# Patient Record
Sex: Female | Born: 1945 | Race: White | Hispanic: No | Smoking: Former smoker
Health system: Southern US, Community
[De-identification: ages and names within clinical notes are randomized; demographics above are authoritative.]

## PROBLEM LIST (undated history)

## (undated) DIAGNOSIS — E782 Mixed hyperlipidemia: Secondary | ICD-10-CM

## (undated) DIAGNOSIS — D649 Anemia, unspecified: Secondary | ICD-10-CM

## (undated) DIAGNOSIS — G971 Other reaction to spinal and lumbar puncture: Secondary | ICD-10-CM

## (undated) DIAGNOSIS — R600 Localized edema: Secondary | ICD-10-CM

## (undated) DIAGNOSIS — M5136 Other intervertebral disc degeneration, lumbar region: Secondary | ICD-10-CM

## (undated) DIAGNOSIS — R609 Edema, unspecified: Secondary | ICD-10-CM

## (undated) DIAGNOSIS — G2581 Restless legs syndrome: Secondary | ICD-10-CM

## (undated) DIAGNOSIS — F32A Depression, unspecified: Secondary | ICD-10-CM

## (undated) DIAGNOSIS — Z87442 Personal history of urinary calculi: Secondary | ICD-10-CM

## (undated) DIAGNOSIS — M48061 Spinal stenosis, lumbar region without neurogenic claudication: Secondary | ICD-10-CM

## (undated) DIAGNOSIS — M51369 Other intervertebral disc degeneration, lumbar region without mention of lumbar back pain or lower extremity pain: Secondary | ICD-10-CM

## (undated) DIAGNOSIS — K219 Gastro-esophageal reflux disease without esophagitis: Secondary | ICD-10-CM

## (undated) DIAGNOSIS — Z9889 Other specified postprocedural states: Secondary | ICD-10-CM

## (undated) DIAGNOSIS — G4730 Sleep apnea, unspecified: Secondary | ICD-10-CM

## (undated) DIAGNOSIS — I10 Essential (primary) hypertension: Secondary | ICD-10-CM

## (undated) DIAGNOSIS — R112 Nausea with vomiting, unspecified: Secondary | ICD-10-CM

## (undated) DIAGNOSIS — R7303 Prediabetes: Secondary | ICD-10-CM

## (undated) DIAGNOSIS — E039 Hypothyroidism, unspecified: Secondary | ICD-10-CM

## (undated) HISTORY — DX: Nausea with vomiting, unspecified: R11.2

## (undated) HISTORY — PX: ABDOMINAL HYSTERECTOMY: SHX81

## (undated) HISTORY — PX: CATARACT EXTRACTION: SUR2

## (undated) HISTORY — PX: RHINOPLASTY: SUR1284

## (undated) HISTORY — PX: TONSILLECTOMY: SUR1361

---

## 2013-11-27 DIAGNOSIS — M4310 Spondylolisthesis, site unspecified: Secondary | ICD-10-CM | POA: Insufficient documentation

## 2013-11-27 DIAGNOSIS — G4700 Insomnia, unspecified: Secondary | ICD-10-CM

## 2013-11-27 HISTORY — DX: Spondylolisthesis, site unspecified: M43.10

## 2013-11-27 HISTORY — DX: Insomnia, unspecified: G47.00

## 2015-07-02 DIAGNOSIS — R079 Chest pain, unspecified: Secondary | ICD-10-CM

## 2015-07-02 HISTORY — DX: Chest pain, unspecified: R07.9

## 2015-10-08 DIAGNOSIS — Z79899 Other long term (current) drug therapy: Secondary | ICD-10-CM

## 2015-10-08 DIAGNOSIS — E039 Hypothyroidism, unspecified: Secondary | ICD-10-CM

## 2015-10-08 DIAGNOSIS — I10 Essential (primary) hypertension: Secondary | ICD-10-CM

## 2015-10-08 DIAGNOSIS — K219 Gastro-esophageal reflux disease without esophagitis: Secondary | ICD-10-CM

## 2015-10-08 HISTORY — DX: Essential (primary) hypertension: I10

## 2015-10-08 HISTORY — DX: Gastro-esophageal reflux disease without esophagitis: K21.9

## 2015-10-08 HISTORY — DX: Hypothyroidism, unspecified: E03.9

## 2015-10-08 HISTORY — DX: Other long term (current) drug therapy: Z79.899

## 2016-08-05 DIAGNOSIS — R5381 Other malaise: Secondary | ICD-10-CM | POA: Diagnosis not present

## 2016-08-05 DIAGNOSIS — R5383 Other fatigue: Secondary | ICD-10-CM | POA: Diagnosis not present

## 2016-08-05 DIAGNOSIS — E782 Mixed hyperlipidemia: Secondary | ICD-10-CM | POA: Diagnosis not present

## 2016-08-06 DIAGNOSIS — L820 Inflamed seborrheic keratosis: Secondary | ICD-10-CM | POA: Diagnosis not present

## 2016-09-10 DIAGNOSIS — I83893 Varicose veins of bilateral lower extremities with other complications: Secondary | ICD-10-CM | POA: Diagnosis not present

## 2016-09-13 DIAGNOSIS — I10 Essential (primary) hypertension: Secondary | ICD-10-CM | POA: Diagnosis not present

## 2016-09-13 DIAGNOSIS — M7989 Other specified soft tissue disorders: Secondary | ICD-10-CM | POA: Diagnosis not present

## 2016-09-13 DIAGNOSIS — M79661 Pain in right lower leg: Secondary | ICD-10-CM | POA: Diagnosis not present

## 2016-09-14 DIAGNOSIS — M79661 Pain in right lower leg: Secondary | ICD-10-CM | POA: Diagnosis not present

## 2016-09-14 DIAGNOSIS — M7062 Trochanteric bursitis, left hip: Secondary | ICD-10-CM | POA: Diagnosis not present

## 2016-09-21 DIAGNOSIS — R2689 Other abnormalities of gait and mobility: Secondary | ICD-10-CM | POA: Diagnosis not present

## 2016-09-21 DIAGNOSIS — M25552 Pain in left hip: Secondary | ICD-10-CM | POA: Diagnosis not present

## 2016-09-21 DIAGNOSIS — M6281 Muscle weakness (generalized): Secondary | ICD-10-CM | POA: Diagnosis not present

## 2016-09-23 DIAGNOSIS — R2689 Other abnormalities of gait and mobility: Secondary | ICD-10-CM | POA: Diagnosis not present

## 2016-09-23 DIAGNOSIS — M25552 Pain in left hip: Secondary | ICD-10-CM | POA: Diagnosis not present

## 2016-09-23 DIAGNOSIS — M6281 Muscle weakness (generalized): Secondary | ICD-10-CM | POA: Diagnosis not present

## 2016-09-27 DIAGNOSIS — M25552 Pain in left hip: Secondary | ICD-10-CM | POA: Diagnosis not present

## 2016-09-27 DIAGNOSIS — R2689 Other abnormalities of gait and mobility: Secondary | ICD-10-CM | POA: Diagnosis not present

## 2016-09-27 DIAGNOSIS — M6281 Muscle weakness (generalized): Secondary | ICD-10-CM | POA: Diagnosis not present

## 2016-09-29 DIAGNOSIS — H26493 Other secondary cataract, bilateral: Secondary | ICD-10-CM | POA: Diagnosis not present

## 2016-09-29 DIAGNOSIS — R2689 Other abnormalities of gait and mobility: Secondary | ICD-10-CM | POA: Diagnosis not present

## 2016-09-29 DIAGNOSIS — M25552 Pain in left hip: Secondary | ICD-10-CM | POA: Diagnosis not present

## 2016-09-29 DIAGNOSIS — M6281 Muscle weakness (generalized): Secondary | ICD-10-CM | POA: Diagnosis not present

## 2016-10-04 DIAGNOSIS — M6281 Muscle weakness (generalized): Secondary | ICD-10-CM | POA: Diagnosis not present

## 2016-10-04 DIAGNOSIS — R2689 Other abnormalities of gait and mobility: Secondary | ICD-10-CM | POA: Diagnosis not present

## 2016-10-04 DIAGNOSIS — M25552 Pain in left hip: Secondary | ICD-10-CM | POA: Diagnosis not present

## 2016-10-07 DIAGNOSIS — R2689 Other abnormalities of gait and mobility: Secondary | ICD-10-CM | POA: Diagnosis not present

## 2016-10-07 DIAGNOSIS — M6281 Muscle weakness (generalized): Secondary | ICD-10-CM | POA: Diagnosis not present

## 2016-10-07 DIAGNOSIS — M25552 Pain in left hip: Secondary | ICD-10-CM | POA: Diagnosis not present

## 2016-10-11 DIAGNOSIS — R2689 Other abnormalities of gait and mobility: Secondary | ICD-10-CM | POA: Diagnosis not present

## 2016-10-11 DIAGNOSIS — M25552 Pain in left hip: Secondary | ICD-10-CM | POA: Diagnosis not present

## 2016-10-11 DIAGNOSIS — M6281 Muscle weakness (generalized): Secondary | ICD-10-CM | POA: Diagnosis not present

## 2016-10-17 DIAGNOSIS — J209 Acute bronchitis, unspecified: Secondary | ICD-10-CM | POA: Diagnosis not present

## 2016-10-17 DIAGNOSIS — R05 Cough: Secondary | ICD-10-CM | POA: Diagnosis not present

## 2016-10-21 DIAGNOSIS — I10 Essential (primary) hypertension: Secondary | ICD-10-CM | POA: Diagnosis not present

## 2016-10-21 DIAGNOSIS — J40 Bronchitis, not specified as acute or chronic: Secondary | ICD-10-CM | POA: Diagnosis not present

## 2016-11-17 DIAGNOSIS — J40 Bronchitis, not specified as acute or chronic: Secondary | ICD-10-CM | POA: Diagnosis not present

## 2016-11-17 DIAGNOSIS — Z79899 Other long term (current) drug therapy: Secondary | ICD-10-CM | POA: Diagnosis not present

## 2016-11-17 DIAGNOSIS — Z6841 Body Mass Index (BMI) 40.0 and over, adult: Secondary | ICD-10-CM | POA: Diagnosis not present

## 2016-11-17 DIAGNOSIS — R079 Chest pain, unspecified: Secondary | ICD-10-CM | POA: Diagnosis not present

## 2016-11-17 DIAGNOSIS — I10 Essential (primary) hypertension: Secondary | ICD-10-CM | POA: Diagnosis not present

## 2016-11-17 HISTORY — DX: Morbid (severe) obesity due to excess calories: E66.01

## 2016-11-18 DIAGNOSIS — R079 Chest pain, unspecified: Secondary | ICD-10-CM | POA: Diagnosis not present

## 2016-11-19 DIAGNOSIS — G4733 Obstructive sleep apnea (adult) (pediatric): Secondary | ICD-10-CM | POA: Diagnosis not present

## 2016-11-19 DIAGNOSIS — J40 Bronchitis, not specified as acute or chronic: Secondary | ICD-10-CM | POA: Diagnosis not present

## 2016-11-19 DIAGNOSIS — R5383 Other fatigue: Secondary | ICD-10-CM | POA: Diagnosis not present

## 2016-11-25 DIAGNOSIS — J40 Bronchitis, not specified as acute or chronic: Secondary | ICD-10-CM | POA: Diagnosis not present

## 2016-11-25 DIAGNOSIS — R5383 Other fatigue: Secondary | ICD-10-CM | POA: Diagnosis not present

## 2016-11-25 DIAGNOSIS — G4733 Obstructive sleep apnea (adult) (pediatric): Secondary | ICD-10-CM | POA: Diagnosis not present

## 2017-01-03 DIAGNOSIS — G4733 Obstructive sleep apnea (adult) (pediatric): Secondary | ICD-10-CM | POA: Diagnosis not present

## 2017-01-07 DIAGNOSIS — L820 Inflamed seborrheic keratosis: Secondary | ICD-10-CM | POA: Diagnosis not present

## 2017-01-07 DIAGNOSIS — D485 Neoplasm of uncertain behavior of skin: Secondary | ICD-10-CM | POA: Diagnosis not present

## 2017-01-07 DIAGNOSIS — L74519 Primary focal hyperhidrosis, unspecified: Secondary | ICD-10-CM | POA: Diagnosis not present

## 2017-01-10 DIAGNOSIS — R5383 Other fatigue: Secondary | ICD-10-CM | POA: Diagnosis not present

## 2017-01-10 DIAGNOSIS — G4733 Obstructive sleep apnea (adult) (pediatric): Secondary | ICD-10-CM | POA: Diagnosis not present

## 2017-02-20 DIAGNOSIS — T7840XA Allergy, unspecified, initial encounter: Secondary | ICD-10-CM | POA: Diagnosis not present

## 2017-02-20 DIAGNOSIS — T63411A Toxic effect of venom of centipedes and venomous millipedes, accidental (unintentional), initial encounter: Secondary | ICD-10-CM | POA: Diagnosis not present

## 2017-02-21 DIAGNOSIS — L74519 Primary focal hyperhidrosis, unspecified: Secondary | ICD-10-CM | POA: Diagnosis not present

## 2017-02-21 DIAGNOSIS — L239 Allergic contact dermatitis, unspecified cause: Secondary | ICD-10-CM | POA: Diagnosis not present

## 2017-05-25 DIAGNOSIS — M79671 Pain in right foot: Secondary | ICD-10-CM | POA: Diagnosis not present

## 2017-06-17 DIAGNOSIS — J050 Acute obstructive laryngitis [croup]: Secondary | ICD-10-CM | POA: Diagnosis not present

## 2017-06-27 DIAGNOSIS — I10 Essential (primary) hypertension: Secondary | ICD-10-CM | POA: Diagnosis not present

## 2017-06-27 DIAGNOSIS — H73009 Acute myringitis, unspecified ear: Secondary | ICD-10-CM | POA: Diagnosis not present

## 2017-07-11 DIAGNOSIS — H73009 Acute myringitis, unspecified ear: Secondary | ICD-10-CM | POA: Diagnosis not present

## 2017-07-11 DIAGNOSIS — H6983 Other specified disorders of Eustachian tube, bilateral: Secondary | ICD-10-CM | POA: Diagnosis not present

## 2017-07-14 DIAGNOSIS — I10 Essential (primary) hypertension: Secondary | ICD-10-CM | POA: Diagnosis not present

## 2017-07-14 DIAGNOSIS — Z79899 Other long term (current) drug therapy: Secondary | ICD-10-CM | POA: Diagnosis not present

## 2017-07-14 DIAGNOSIS — G2581 Restless legs syndrome: Secondary | ICD-10-CM | POA: Diagnosis not present

## 2017-07-14 DIAGNOSIS — K219 Gastro-esophageal reflux disease without esophagitis: Secondary | ICD-10-CM | POA: Diagnosis not present

## 2017-07-14 DIAGNOSIS — Z6841 Body Mass Index (BMI) 40.0 and over, adult: Secondary | ICD-10-CM | POA: Diagnosis not present

## 2017-07-14 DIAGNOSIS — E039 Hypothyroidism, unspecified: Secondary | ICD-10-CM | POA: Diagnosis not present

## 2017-07-14 DIAGNOSIS — E782 Mixed hyperlipidemia: Secondary | ICD-10-CM | POA: Diagnosis not present

## 2017-07-20 DIAGNOSIS — H11442 Conjunctival cysts, left eye: Secondary | ICD-10-CM | POA: Diagnosis not present

## 2017-07-29 DIAGNOSIS — H6983 Other specified disorders of Eustachian tube, bilateral: Secondary | ICD-10-CM | POA: Diagnosis not present

## 2017-07-29 DIAGNOSIS — R490 Dysphonia: Secondary | ICD-10-CM | POA: Diagnosis not present

## 2017-07-29 DIAGNOSIS — K219 Gastro-esophageal reflux disease without esophagitis: Secondary | ICD-10-CM | POA: Diagnosis not present

## 2017-08-02 DIAGNOSIS — Z1231 Encounter for screening mammogram for malignant neoplasm of breast: Secondary | ICD-10-CM | POA: Diagnosis not present

## 2017-08-17 DIAGNOSIS — H5202 Hypermetropia, left eye: Secondary | ICD-10-CM | POA: Diagnosis not present

## 2017-08-17 DIAGNOSIS — Z9841 Cataract extraction status, right eye: Secondary | ICD-10-CM | POA: Diagnosis not present

## 2017-08-17 DIAGNOSIS — H52223 Regular astigmatism, bilateral: Secondary | ICD-10-CM | POA: Diagnosis not present

## 2017-08-17 DIAGNOSIS — H26492 Other secondary cataract, left eye: Secondary | ICD-10-CM | POA: Diagnosis not present

## 2017-08-17 DIAGNOSIS — H47233 Glaucomatous optic atrophy, bilateral: Secondary | ICD-10-CM | POA: Diagnosis not present

## 2017-08-17 DIAGNOSIS — H40013 Open angle with borderline findings, low risk, bilateral: Secondary | ICD-10-CM | POA: Diagnosis not present

## 2017-08-17 DIAGNOSIS — H524 Presbyopia: Secondary | ICD-10-CM | POA: Diagnosis not present

## 2017-08-17 DIAGNOSIS — Z961 Presence of intraocular lens: Secondary | ICD-10-CM | POA: Diagnosis not present

## 2017-08-29 DIAGNOSIS — R490 Dysphonia: Secondary | ICD-10-CM | POA: Diagnosis not present

## 2017-08-29 DIAGNOSIS — I10 Essential (primary) hypertension: Secondary | ICD-10-CM | POA: Diagnosis not present

## 2017-08-29 DIAGNOSIS — H6981 Other specified disorders of Eustachian tube, right ear: Secondary | ICD-10-CM | POA: Diagnosis not present

## 2017-09-15 DIAGNOSIS — R600 Localized edema: Secondary | ICD-10-CM | POA: Diagnosis not present

## 2017-09-15 DIAGNOSIS — M79605 Pain in left leg: Secondary | ICD-10-CM | POA: Diagnosis not present

## 2017-09-15 DIAGNOSIS — I87393 Chronic venous hypertension (idiopathic) with other complications of bilateral lower extremity: Secondary | ICD-10-CM | POA: Diagnosis not present

## 2017-09-15 DIAGNOSIS — M79604 Pain in right leg: Secondary | ICD-10-CM | POA: Diagnosis not present

## 2017-09-20 DIAGNOSIS — I10 Essential (primary) hypertension: Secondary | ICD-10-CM | POA: Diagnosis not present

## 2017-09-20 DIAGNOSIS — E039 Hypothyroidism, unspecified: Secondary | ICD-10-CM | POA: Diagnosis not present

## 2017-09-20 DIAGNOSIS — I872 Venous insufficiency (chronic) (peripheral): Secondary | ICD-10-CM | POA: Diagnosis not present

## 2017-09-20 DIAGNOSIS — G2581 Restless legs syndrome: Secondary | ICD-10-CM | POA: Diagnosis not present

## 2017-09-26 DIAGNOSIS — K219 Gastro-esophageal reflux disease without esophagitis: Secondary | ICD-10-CM | POA: Diagnosis not present

## 2017-09-26 DIAGNOSIS — J310 Chronic rhinitis: Secondary | ICD-10-CM | POA: Diagnosis not present

## 2017-09-26 DIAGNOSIS — T485X1A Poisoning by other anti-common-cold drugs, accidental (unintentional), initial encounter: Secondary | ICD-10-CM | POA: Diagnosis not present

## 2017-10-07 DIAGNOSIS — R600 Localized edema: Secondary | ICD-10-CM | POA: Diagnosis not present

## 2017-10-07 DIAGNOSIS — I8312 Varicose veins of left lower extremity with inflammation: Secondary | ICD-10-CM | POA: Diagnosis not present

## 2017-10-10 DIAGNOSIS — J209 Acute bronchitis, unspecified: Secondary | ICD-10-CM | POA: Diagnosis not present

## 2017-10-25 DIAGNOSIS — R0981 Nasal congestion: Secondary | ICD-10-CM | POA: Diagnosis not present

## 2017-10-25 DIAGNOSIS — J342 Deviated nasal septum: Secondary | ICD-10-CM | POA: Diagnosis not present

## 2017-10-25 DIAGNOSIS — J3489 Other specified disorders of nose and nasal sinuses: Secondary | ICD-10-CM | POA: Diagnosis not present

## 2017-10-25 DIAGNOSIS — J343 Hypertrophy of nasal turbinates: Secondary | ICD-10-CM | POA: Diagnosis not present

## 2017-10-25 DIAGNOSIS — H6980 Other specified disorders of Eustachian tube, unspecified ear: Secondary | ICD-10-CM | POA: Diagnosis not present

## 2017-10-25 DIAGNOSIS — Z8709 Personal history of other diseases of the respiratory system: Secondary | ICD-10-CM | POA: Diagnosis not present

## 2017-10-26 DIAGNOSIS — M4310 Spondylolisthesis, site unspecified: Secondary | ICD-10-CM | POA: Diagnosis not present

## 2017-10-26 DIAGNOSIS — N200 Calculus of kidney: Secondary | ICD-10-CM | POA: Diagnosis not present

## 2017-10-26 DIAGNOSIS — R3914 Feeling of incomplete bladder emptying: Secondary | ICD-10-CM | POA: Diagnosis not present

## 2017-10-26 DIAGNOSIS — M549 Dorsalgia, unspecified: Secondary | ICD-10-CM | POA: Diagnosis not present

## 2017-10-26 DIAGNOSIS — R350 Frequency of micturition: Secondary | ICD-10-CM | POA: Diagnosis not present

## 2017-10-26 DIAGNOSIS — R319 Hematuria, unspecified: Secondary | ICD-10-CM | POA: Diagnosis not present

## 2017-10-31 DIAGNOSIS — Z79899 Other long term (current) drug therapy: Secondary | ICD-10-CM | POA: Diagnosis not present

## 2017-10-31 DIAGNOSIS — N200 Calculus of kidney: Secondary | ICD-10-CM | POA: Diagnosis not present

## 2017-10-31 DIAGNOSIS — D3001 Benign neoplasm of right kidney: Secondary | ICD-10-CM | POA: Diagnosis not present

## 2017-10-31 DIAGNOSIS — N281 Cyst of kidney, acquired: Secondary | ICD-10-CM | POA: Diagnosis not present

## 2017-10-31 DIAGNOSIS — E279 Disorder of adrenal gland, unspecified: Secondary | ICD-10-CM | POA: Diagnosis not present

## 2017-10-31 DIAGNOSIS — R3129 Other microscopic hematuria: Secondary | ICD-10-CM | POA: Diagnosis not present

## 2017-10-31 DIAGNOSIS — R110 Nausea: Secondary | ICD-10-CM | POA: Diagnosis not present

## 2017-10-31 DIAGNOSIS — K7689 Other specified diseases of liver: Secondary | ICD-10-CM | POA: Diagnosis not present

## 2017-11-08 DIAGNOSIS — N3941 Urge incontinence: Secondary | ICD-10-CM | POA: Diagnosis not present

## 2017-11-08 DIAGNOSIS — N200 Calculus of kidney: Secondary | ICD-10-CM | POA: Diagnosis not present

## 2017-11-08 DIAGNOSIS — N952 Postmenopausal atrophic vaginitis: Secondary | ICD-10-CM | POA: Diagnosis not present

## 2017-11-24 DIAGNOSIS — H47233 Glaucomatous optic atrophy, bilateral: Secondary | ICD-10-CM | POA: Diagnosis not present

## 2017-11-24 DIAGNOSIS — H40013 Open angle with borderline findings, low risk, bilateral: Secondary | ICD-10-CM | POA: Diagnosis not present

## 2018-02-06 DIAGNOSIS — R21 Rash and other nonspecific skin eruption: Secondary | ICD-10-CM | POA: Diagnosis not present

## 2018-03-13 DIAGNOSIS — N200 Calculus of kidney: Secondary | ICD-10-CM | POA: Diagnosis not present

## 2018-03-13 DIAGNOSIS — N952 Postmenopausal atrophic vaginitis: Secondary | ICD-10-CM | POA: Diagnosis not present

## 2018-03-13 DIAGNOSIS — N3941 Urge incontinence: Secondary | ICD-10-CM | POA: Diagnosis not present

## 2018-04-04 DIAGNOSIS — N3941 Urge incontinence: Secondary | ICD-10-CM | POA: Diagnosis not present

## 2018-05-24 DIAGNOSIS — E782 Mixed hyperlipidemia: Secondary | ICD-10-CM | POA: Diagnosis not present

## 2018-05-24 DIAGNOSIS — Z79899 Other long term (current) drug therapy: Secondary | ICD-10-CM | POA: Diagnosis not present

## 2018-05-24 DIAGNOSIS — R5381 Other malaise: Secondary | ICD-10-CM | POA: Diagnosis not present

## 2018-05-24 DIAGNOSIS — E039 Hypothyroidism, unspecified: Secondary | ICD-10-CM | POA: Diagnosis not present

## 2018-05-24 DIAGNOSIS — G2581 Restless legs syndrome: Secondary | ICD-10-CM | POA: Diagnosis not present

## 2018-05-24 DIAGNOSIS — I10 Essential (primary) hypertension: Secondary | ICD-10-CM | POA: Diagnosis not present

## 2018-05-24 DIAGNOSIS — R609 Edema, unspecified: Secondary | ICD-10-CM | POA: Diagnosis not present

## 2018-05-24 DIAGNOSIS — Z23 Encounter for immunization: Secondary | ICD-10-CM | POA: Diagnosis not present

## 2018-05-24 DIAGNOSIS — M5136 Other intervertebral disc degeneration, lumbar region: Secondary | ICD-10-CM | POA: Diagnosis not present

## 2018-05-24 DIAGNOSIS — M48061 Spinal stenosis, lumbar region without neurogenic claudication: Secondary | ICD-10-CM | POA: Diagnosis not present

## 2018-05-24 DIAGNOSIS — M4310 Spondylolisthesis, site unspecified: Secondary | ICD-10-CM | POA: Diagnosis not present

## 2018-05-24 DIAGNOSIS — R5383 Other fatigue: Secondary | ICD-10-CM | POA: Diagnosis not present

## 2018-05-24 DIAGNOSIS — K219 Gastro-esophageal reflux disease without esophagitis: Secondary | ICD-10-CM | POA: Diagnosis not present

## 2018-05-25 DIAGNOSIS — R5383 Other fatigue: Secondary | ICD-10-CM

## 2018-05-25 DIAGNOSIS — R5381 Other malaise: Secondary | ICD-10-CM

## 2018-05-25 HISTORY — DX: Other malaise: R53.81

## 2018-05-25 HISTORY — DX: Other fatigue: R53.83

## 2018-05-29 DIAGNOSIS — H40013 Open angle with borderline findings, low risk, bilateral: Secondary | ICD-10-CM | POA: Diagnosis not present

## 2018-05-29 DIAGNOSIS — Z79899 Other long term (current) drug therapy: Secondary | ICD-10-CM | POA: Diagnosis not present

## 2018-05-29 DIAGNOSIS — E782 Mixed hyperlipidemia: Secondary | ICD-10-CM | POA: Diagnosis not present

## 2018-05-29 DIAGNOSIS — H47233 Glaucomatous optic atrophy, bilateral: Secondary | ICD-10-CM | POA: Diagnosis not present

## 2018-05-29 DIAGNOSIS — R5383 Other fatigue: Secondary | ICD-10-CM | POA: Diagnosis not present

## 2018-05-29 DIAGNOSIS — R5381 Other malaise: Secondary | ICD-10-CM | POA: Diagnosis not present

## 2018-05-31 ENCOUNTER — Other Ambulatory Visit: Payer: Self-pay | Admitting: Specialist

## 2018-05-31 DIAGNOSIS — M4310 Spondylolisthesis, site unspecified: Secondary | ICD-10-CM

## 2018-05-31 DIAGNOSIS — M5136 Other intervertebral disc degeneration, lumbar region: Secondary | ICD-10-CM

## 2018-05-31 DIAGNOSIS — M48061 Spinal stenosis, lumbar region without neurogenic claudication: Secondary | ICD-10-CM

## 2018-06-01 DIAGNOSIS — M48061 Spinal stenosis, lumbar region without neurogenic claudication: Secondary | ICD-10-CM | POA: Diagnosis not present

## 2018-06-01 DIAGNOSIS — M5136 Other intervertebral disc degeneration, lumbar region: Secondary | ICD-10-CM | POA: Diagnosis not present

## 2018-06-01 DIAGNOSIS — M4310 Spondylolisthesis, site unspecified: Secondary | ICD-10-CM | POA: Diagnosis not present

## 2018-06-03 DIAGNOSIS — A419 Sepsis, unspecified organism: Secondary | ICD-10-CM | POA: Diagnosis not present

## 2018-06-03 DIAGNOSIS — R509 Fever, unspecified: Secondary | ICD-10-CM | POA: Diagnosis not present

## 2018-06-03 DIAGNOSIS — R51 Headache: Secondary | ICD-10-CM | POA: Diagnosis not present

## 2018-06-03 DIAGNOSIS — N12 Tubulo-interstitial nephritis, not specified as acute or chronic: Secondary | ICD-10-CM | POA: Diagnosis not present

## 2018-06-03 DIAGNOSIS — K760 Fatty (change of) liver, not elsewhere classified: Secondary | ICD-10-CM | POA: Diagnosis not present

## 2018-06-07 DIAGNOSIS — M5136 Other intervertebral disc degeneration, lumbar region: Secondary | ICD-10-CM | POA: Diagnosis not present

## 2018-06-13 ENCOUNTER — Other Ambulatory Visit: Payer: Self-pay

## 2018-07-03 DIAGNOSIS — Z7982 Long term (current) use of aspirin: Secondary | ICD-10-CM | POA: Diagnosis not present

## 2018-07-03 DIAGNOSIS — M47817 Spondylosis without myelopathy or radiculopathy, lumbosacral region: Secondary | ICD-10-CM | POA: Diagnosis not present

## 2018-07-03 DIAGNOSIS — M545 Low back pain: Secondary | ICD-10-CM | POA: Diagnosis not present

## 2018-07-03 DIAGNOSIS — Z79899 Other long term (current) drug therapy: Secondary | ICD-10-CM | POA: Diagnosis not present

## 2018-07-03 DIAGNOSIS — M5136 Other intervertebral disc degeneration, lumbar region: Secondary | ICD-10-CM | POA: Diagnosis not present

## 2018-07-25 DIAGNOSIS — M5136 Other intervertebral disc degeneration, lumbar region: Secondary | ICD-10-CM | POA: Diagnosis not present

## 2018-08-01 DIAGNOSIS — M5136 Other intervertebral disc degeneration, lumbar region: Secondary | ICD-10-CM | POA: Diagnosis not present

## 2018-08-03 DIAGNOSIS — M5136 Other intervertebral disc degeneration, lumbar region: Secondary | ICD-10-CM | POA: Diagnosis not present

## 2018-08-04 DIAGNOSIS — M5136 Other intervertebral disc degeneration, lumbar region: Secondary | ICD-10-CM | POA: Diagnosis not present

## 2018-08-08 DIAGNOSIS — M5136 Other intervertebral disc degeneration, lumbar region: Secondary | ICD-10-CM | POA: Diagnosis not present

## 2018-08-12 DIAGNOSIS — Z1231 Encounter for screening mammogram for malignant neoplasm of breast: Secondary | ICD-10-CM | POA: Diagnosis not present

## 2018-08-14 DIAGNOSIS — M5136 Other intervertebral disc degeneration, lumbar region: Secondary | ICD-10-CM | POA: Diagnosis not present

## 2018-08-17 DIAGNOSIS — M5136 Other intervertebral disc degeneration, lumbar region: Secondary | ICD-10-CM | POA: Diagnosis not present

## 2018-08-21 DIAGNOSIS — H40013 Open angle with borderline findings, low risk, bilateral: Secondary | ICD-10-CM | POA: Diagnosis not present

## 2018-08-21 DIAGNOSIS — Z9841 Cataract extraction status, right eye: Secondary | ICD-10-CM | POA: Diagnosis not present

## 2018-08-21 DIAGNOSIS — Z961 Presence of intraocular lens: Secondary | ICD-10-CM | POA: Diagnosis not present

## 2018-08-21 DIAGNOSIS — H47233 Glaucomatous optic atrophy, bilateral: Secondary | ICD-10-CM | POA: Diagnosis not present

## 2018-08-21 DIAGNOSIS — M5136 Other intervertebral disc degeneration, lumbar region: Secondary | ICD-10-CM | POA: Diagnosis not present

## 2018-08-21 DIAGNOSIS — H5213 Myopia, bilateral: Secondary | ICD-10-CM | POA: Diagnosis not present

## 2018-08-21 DIAGNOSIS — Z9842 Cataract extraction status, left eye: Secondary | ICD-10-CM | POA: Diagnosis not present

## 2018-08-21 DIAGNOSIS — H524 Presbyopia: Secondary | ICD-10-CM | POA: Diagnosis not present

## 2018-08-21 DIAGNOSIS — H52223 Regular astigmatism, bilateral: Secondary | ICD-10-CM | POA: Diagnosis not present

## 2018-08-24 DIAGNOSIS — M5136 Other intervertebral disc degeneration, lumbar region: Secondary | ICD-10-CM | POA: Diagnosis not present

## 2018-08-31 DIAGNOSIS — M5136 Other intervertebral disc degeneration, lumbar region: Secondary | ICD-10-CM | POA: Diagnosis not present

## 2018-09-05 DIAGNOSIS — M5136 Other intervertebral disc degeneration, lumbar region: Secondary | ICD-10-CM | POA: Diagnosis not present

## 2018-09-07 DIAGNOSIS — M5136 Other intervertebral disc degeneration, lumbar region: Secondary | ICD-10-CM | POA: Diagnosis not present

## 2018-09-11 DIAGNOSIS — M5136 Other intervertebral disc degeneration, lumbar region: Secondary | ICD-10-CM | POA: Diagnosis not present

## 2018-09-14 DIAGNOSIS — N3941 Urge incontinence: Secondary | ICD-10-CM | POA: Diagnosis not present

## 2018-09-14 DIAGNOSIS — Z87442 Personal history of urinary calculi: Secondary | ICD-10-CM | POA: Diagnosis not present

## 2018-09-14 DIAGNOSIS — N952 Postmenopausal atrophic vaginitis: Secondary | ICD-10-CM | POA: Diagnosis not present

## 2018-10-03 DIAGNOSIS — J4530 Mild persistent asthma, uncomplicated: Secondary | ICD-10-CM | POA: Diagnosis not present

## 2018-10-03 DIAGNOSIS — G4733 Obstructive sleep apnea (adult) (pediatric): Secondary | ICD-10-CM | POA: Diagnosis not present

## 2018-10-03 DIAGNOSIS — R5383 Other fatigue: Secondary | ICD-10-CM | POA: Diagnosis not present

## 2018-11-22 DIAGNOSIS — H1045 Other chronic allergic conjunctivitis: Secondary | ICD-10-CM | POA: Diagnosis not present

## 2018-11-22 DIAGNOSIS — H40013 Open angle with borderline findings, low risk, bilateral: Secondary | ICD-10-CM | POA: Diagnosis not present

## 2018-11-23 DIAGNOSIS — E782 Mixed hyperlipidemia: Secondary | ICD-10-CM | POA: Diagnosis not present

## 2018-11-23 DIAGNOSIS — Z79899 Other long term (current) drug therapy: Secondary | ICD-10-CM | POA: Diagnosis not present

## 2018-11-23 DIAGNOSIS — M4310 Spondylolisthesis, site unspecified: Secondary | ICD-10-CM | POA: Diagnosis not present

## 2018-11-23 DIAGNOSIS — R5381 Other malaise: Secondary | ICD-10-CM | POA: Diagnosis not present

## 2018-11-23 DIAGNOSIS — G2581 Restless legs syndrome: Secondary | ICD-10-CM | POA: Diagnosis not present

## 2018-11-23 DIAGNOSIS — I10 Essential (primary) hypertension: Secondary | ICD-10-CM | POA: Diagnosis not present

## 2018-11-23 DIAGNOSIS — R5383 Other fatigue: Secondary | ICD-10-CM | POA: Diagnosis not present

## 2018-11-23 DIAGNOSIS — E039 Hypothyroidism, unspecified: Secondary | ICD-10-CM | POA: Diagnosis not present

## 2018-11-23 DIAGNOSIS — M5136 Other intervertebral disc degeneration, lumbar region: Secondary | ICD-10-CM | POA: Diagnosis not present

## 2018-11-23 DIAGNOSIS — M48061 Spinal stenosis, lumbar region without neurogenic claudication: Secondary | ICD-10-CM | POA: Diagnosis not present

## 2018-11-23 DIAGNOSIS — R609 Edema, unspecified: Secondary | ICD-10-CM | POA: Diagnosis not present

## 2018-11-23 DIAGNOSIS — K219 Gastro-esophageal reflux disease without esophagitis: Secondary | ICD-10-CM | POA: Diagnosis not present

## 2018-12-15 DIAGNOSIS — E039 Hypothyroidism, unspecified: Secondary | ICD-10-CM | POA: Diagnosis not present

## 2018-12-15 DIAGNOSIS — E042 Nontoxic multinodular goiter: Secondary | ICD-10-CM | POA: Diagnosis not present

## 2019-01-16 DIAGNOSIS — M545 Low back pain: Secondary | ICD-10-CM | POA: Diagnosis not present

## 2019-01-16 DIAGNOSIS — M7062 Trochanteric bursitis, left hip: Secondary | ICD-10-CM | POA: Diagnosis not present

## 2019-03-16 DIAGNOSIS — N952 Postmenopausal atrophic vaginitis: Secondary | ICD-10-CM | POA: Diagnosis not present

## 2019-03-16 DIAGNOSIS — N3941 Urge incontinence: Secondary | ICD-10-CM | POA: Diagnosis not present

## 2019-04-19 DIAGNOSIS — M7062 Trochanteric bursitis, left hip: Secondary | ICD-10-CM | POA: Diagnosis not present

## 2019-04-25 DIAGNOSIS — B029 Zoster without complications: Secondary | ICD-10-CM | POA: Diagnosis not present

## 2019-05-25 DIAGNOSIS — H40013 Open angle with borderline findings, low risk, bilateral: Secondary | ICD-10-CM | POA: Diagnosis not present

## 2019-05-25 DIAGNOSIS — H47233 Glaucomatous optic atrophy, bilateral: Secondary | ICD-10-CM | POA: Diagnosis not present

## 2019-05-28 DIAGNOSIS — Z23 Encounter for immunization: Secondary | ICD-10-CM | POA: Diagnosis not present

## 2019-06-26 DIAGNOSIS — R609 Edema, unspecified: Secondary | ICD-10-CM | POA: Diagnosis not present

## 2019-06-26 DIAGNOSIS — R5381 Other malaise: Secondary | ICD-10-CM | POA: Diagnosis not present

## 2019-06-26 DIAGNOSIS — I10 Essential (primary) hypertension: Secondary | ICD-10-CM | POA: Diagnosis not present

## 2019-06-26 DIAGNOSIS — R5383 Other fatigue: Secondary | ICD-10-CM | POA: Diagnosis not present

## 2019-06-26 DIAGNOSIS — E039 Hypothyroidism, unspecified: Secondary | ICD-10-CM | POA: Diagnosis not present

## 2019-06-26 DIAGNOSIS — M48061 Spinal stenosis, lumbar region without neurogenic claudication: Secondary | ICD-10-CM | POA: Diagnosis not present

## 2019-06-26 DIAGNOSIS — F331 Major depressive disorder, recurrent, moderate: Secondary | ICD-10-CM

## 2019-06-26 DIAGNOSIS — E782 Mixed hyperlipidemia: Secondary | ICD-10-CM | POA: Diagnosis not present

## 2019-06-26 DIAGNOSIS — M5136 Other intervertebral disc degeneration, lumbar region: Secondary | ICD-10-CM | POA: Diagnosis not present

## 2019-06-26 DIAGNOSIS — G2581 Restless legs syndrome: Secondary | ICD-10-CM | POA: Diagnosis not present

## 2019-06-26 DIAGNOSIS — K219 Gastro-esophageal reflux disease without esophagitis: Secondary | ICD-10-CM | POA: Diagnosis not present

## 2019-06-26 DIAGNOSIS — Z79899 Other long term (current) drug therapy: Secondary | ICD-10-CM | POA: Diagnosis not present

## 2019-06-26 HISTORY — DX: Major depressive disorder, recurrent, moderate: F33.1

## 2019-07-09 DIAGNOSIS — H1045 Other chronic allergic conjunctivitis: Secondary | ICD-10-CM | POA: Diagnosis not present

## 2019-07-23 DIAGNOSIS — M79609 Pain in unspecified limb: Secondary | ICD-10-CM | POA: Insufficient documentation

## 2019-07-23 HISTORY — DX: Pain in unspecified limb: M79.609

## 2019-08-28 DIAGNOSIS — H40013 Open angle with borderline findings, low risk, bilateral: Secondary | ICD-10-CM | POA: Diagnosis not present

## 2019-08-28 DIAGNOSIS — H5213 Myopia, bilateral: Secondary | ICD-10-CM | POA: Diagnosis not present

## 2019-08-28 DIAGNOSIS — Z9841 Cataract extraction status, right eye: Secondary | ICD-10-CM | POA: Diagnosis not present

## 2019-08-28 DIAGNOSIS — H47233 Glaucomatous optic atrophy, bilateral: Secondary | ICD-10-CM | POA: Diagnosis not present

## 2019-08-28 DIAGNOSIS — Z961 Presence of intraocular lens: Secondary | ICD-10-CM | POA: Diagnosis not present

## 2019-08-28 DIAGNOSIS — H524 Presbyopia: Secondary | ICD-10-CM | POA: Diagnosis not present

## 2019-08-28 DIAGNOSIS — H52223 Regular astigmatism, bilateral: Secondary | ICD-10-CM | POA: Diagnosis not present

## 2019-08-28 DIAGNOSIS — Z9842 Cataract extraction status, left eye: Secondary | ICD-10-CM | POA: Diagnosis not present

## 2019-10-16 DIAGNOSIS — Z1231 Encounter for screening mammogram for malignant neoplasm of breast: Secondary | ICD-10-CM | POA: Diagnosis not present

## 2019-10-22 DIAGNOSIS — H1045 Other chronic allergic conjunctivitis: Secondary | ICD-10-CM | POA: Diagnosis not present

## 2019-10-23 DIAGNOSIS — M1711 Unilateral primary osteoarthritis, right knee: Secondary | ICD-10-CM | POA: Diagnosis not present

## 2019-10-23 DIAGNOSIS — R61 Generalized hyperhidrosis: Secondary | ICD-10-CM

## 2019-10-23 HISTORY — DX: Generalized hyperhidrosis: R61

## 2019-11-30 DIAGNOSIS — H47233 Glaucomatous optic atrophy, bilateral: Secondary | ICD-10-CM | POA: Diagnosis not present

## 2019-11-30 DIAGNOSIS — H40013 Open angle with borderline findings, low risk, bilateral: Secondary | ICD-10-CM | POA: Diagnosis not present

## 2019-12-26 DIAGNOSIS — G2581 Restless legs syndrome: Secondary | ICD-10-CM | POA: Diagnosis not present

## 2019-12-26 DIAGNOSIS — E782 Mixed hyperlipidemia: Secondary | ICD-10-CM | POA: Diagnosis not present

## 2019-12-26 DIAGNOSIS — R609 Edema, unspecified: Secondary | ICD-10-CM | POA: Diagnosis not present

## 2019-12-26 DIAGNOSIS — R5383 Other fatigue: Secondary | ICD-10-CM | POA: Diagnosis not present

## 2019-12-26 DIAGNOSIS — R5381 Other malaise: Secondary | ICD-10-CM | POA: Diagnosis not present

## 2019-12-26 DIAGNOSIS — Z0000 Encounter for general adult medical examination without abnormal findings: Secondary | ICD-10-CM | POA: Diagnosis not present

## 2019-12-26 DIAGNOSIS — I10 Essential (primary) hypertension: Secondary | ICD-10-CM | POA: Diagnosis not present

## 2019-12-26 DIAGNOSIS — Z79899 Other long term (current) drug therapy: Secondary | ICD-10-CM | POA: Diagnosis not present

## 2019-12-26 DIAGNOSIS — M4310 Spondylolisthesis, site unspecified: Secondary | ICD-10-CM | POA: Diagnosis not present

## 2019-12-26 DIAGNOSIS — R7303 Prediabetes: Secondary | ICD-10-CM | POA: Diagnosis not present

## 2019-12-26 DIAGNOSIS — F331 Major depressive disorder, recurrent, moderate: Secondary | ICD-10-CM | POA: Diagnosis not present

## 2019-12-26 DIAGNOSIS — M5136 Other intervertebral disc degeneration, lumbar region: Secondary | ICD-10-CM | POA: Diagnosis not present

## 2019-12-26 DIAGNOSIS — E039 Hypothyroidism, unspecified: Secondary | ICD-10-CM | POA: Diagnosis not present

## 2019-12-26 DIAGNOSIS — N952 Postmenopausal atrophic vaginitis: Secondary | ICD-10-CM

## 2019-12-26 DIAGNOSIS — K219 Gastro-esophageal reflux disease without esophagitis: Secondary | ICD-10-CM | POA: Diagnosis not present

## 2019-12-26 HISTORY — DX: Postmenopausal atrophic vaginitis: N95.2

## 2020-04-16 DIAGNOSIS — M48061 Spinal stenosis, lumbar region without neurogenic claudication: Secondary | ICD-10-CM | POA: Diagnosis not present

## 2020-04-16 DIAGNOSIS — M5136 Other intervertebral disc degeneration, lumbar region: Secondary | ICD-10-CM | POA: Diagnosis not present

## 2020-04-25 DIAGNOSIS — M5137 Other intervertebral disc degeneration, lumbosacral region: Secondary | ICD-10-CM | POA: Diagnosis not present

## 2020-04-28 DIAGNOSIS — Z23 Encounter for immunization: Secondary | ICD-10-CM | POA: Diagnosis not present

## 2020-05-14 DIAGNOSIS — F419 Anxiety disorder, unspecified: Secondary | ICD-10-CM | POA: Diagnosis not present

## 2020-05-14 DIAGNOSIS — M47816 Spondylosis without myelopathy or radiculopathy, lumbar region: Secondary | ICD-10-CM | POA: Diagnosis not present

## 2020-05-21 DIAGNOSIS — M47816 Spondylosis without myelopathy or radiculopathy, lumbar region: Secondary | ICD-10-CM

## 2020-05-21 HISTORY — DX: Spondylosis without myelopathy or radiculopathy, lumbar region: M47.816

## 2020-05-27 DIAGNOSIS — M25561 Pain in right knee: Secondary | ICD-10-CM | POA: Diagnosis not present

## 2020-05-27 DIAGNOSIS — M1711 Unilateral primary osteoarthritis, right knee: Secondary | ICD-10-CM | POA: Diagnosis not present

## 2020-06-10 DIAGNOSIS — M47816 Spondylosis without myelopathy or radiculopathy, lumbar region: Secondary | ICD-10-CM | POA: Diagnosis not present

## 2020-06-24 DIAGNOSIS — M47816 Spondylosis without myelopathy or radiculopathy, lumbar region: Secondary | ICD-10-CM | POA: Diagnosis not present

## 2020-06-27 DIAGNOSIS — H40013 Open angle with borderline findings, low risk, bilateral: Secondary | ICD-10-CM | POA: Diagnosis not present

## 2020-06-27 DIAGNOSIS — H47233 Glaucomatous optic atrophy, bilateral: Secondary | ICD-10-CM | POA: Diagnosis not present

## 2020-07-03 DIAGNOSIS — Z23 Encounter for immunization: Secondary | ICD-10-CM | POA: Diagnosis not present

## 2020-07-22 DIAGNOSIS — M47816 Spondylosis without myelopathy or radiculopathy, lumbar region: Secondary | ICD-10-CM | POA: Diagnosis not present

## 2020-07-22 DIAGNOSIS — M47896 Other spondylosis, lumbar region: Secondary | ICD-10-CM | POA: Diagnosis not present

## 2020-08-07 DIAGNOSIS — M5136 Other intervertebral disc degeneration, lumbar region: Secondary | ICD-10-CM | POA: Diagnosis not present

## 2020-08-20 DIAGNOSIS — M48061 Spinal stenosis, lumbar region without neurogenic claudication: Secondary | ICD-10-CM | POA: Diagnosis not present

## 2020-08-20 DIAGNOSIS — M5416 Radiculopathy, lumbar region: Secondary | ICD-10-CM | POA: Diagnosis not present

## 2020-08-20 DIAGNOSIS — M5136 Other intervertebral disc degeneration, lumbar region: Secondary | ICD-10-CM | POA: Diagnosis not present

## 2020-08-27 DIAGNOSIS — G894 Chronic pain syndrome: Secondary | ICD-10-CM | POA: Diagnosis not present

## 2020-08-28 DIAGNOSIS — M1711 Unilateral primary osteoarthritis, right knee: Secondary | ICD-10-CM | POA: Diagnosis not present

## 2020-08-28 DIAGNOSIS — M25561 Pain in right knee: Secondary | ICD-10-CM | POA: Diagnosis not present

## 2020-10-03 DIAGNOSIS — G894 Chronic pain syndrome: Secondary | ICD-10-CM | POA: Diagnosis not present

## 2020-10-03 DIAGNOSIS — M48061 Spinal stenosis, lumbar region without neurogenic claudication: Secondary | ICD-10-CM | POA: Diagnosis not present

## 2020-10-22 DIAGNOSIS — H26492 Other secondary cataract, left eye: Secondary | ICD-10-CM | POA: Diagnosis not present

## 2020-10-22 DIAGNOSIS — H52223 Regular astigmatism, bilateral: Secondary | ICD-10-CM | POA: Diagnosis not present

## 2020-10-22 DIAGNOSIS — Z9841 Cataract extraction status, right eye: Secondary | ICD-10-CM | POA: Diagnosis not present

## 2020-10-22 DIAGNOSIS — H40003 Preglaucoma, unspecified, bilateral: Secondary | ICD-10-CM | POA: Diagnosis not present

## 2020-10-22 DIAGNOSIS — H5213 Myopia, bilateral: Secondary | ICD-10-CM | POA: Diagnosis not present

## 2020-10-22 DIAGNOSIS — H524 Presbyopia: Secondary | ICD-10-CM | POA: Diagnosis not present

## 2020-10-22 DIAGNOSIS — H40013 Open angle with borderline findings, low risk, bilateral: Secondary | ICD-10-CM | POA: Diagnosis not present

## 2020-10-22 DIAGNOSIS — Z961 Presence of intraocular lens: Secondary | ICD-10-CM | POA: Diagnosis not present

## 2020-10-22 DIAGNOSIS — H47233 Glaucomatous optic atrophy, bilateral: Secondary | ICD-10-CM | POA: Diagnosis not present

## 2020-11-04 DIAGNOSIS — M546 Pain in thoracic spine: Secondary | ICD-10-CM | POA: Diagnosis not present

## 2020-11-07 DIAGNOSIS — M47896 Other spondylosis, lumbar region: Secondary | ICD-10-CM | POA: Diagnosis not present

## 2020-11-07 DIAGNOSIS — M546 Pain in thoracic spine: Secondary | ICD-10-CM | POA: Diagnosis not present

## 2020-11-07 DIAGNOSIS — Z6841 Body Mass Index (BMI) 40.0 and over, adult: Secondary | ICD-10-CM | POA: Diagnosis not present

## 2020-11-19 DIAGNOSIS — R7303 Prediabetes: Secondary | ICD-10-CM | POA: Diagnosis not present

## 2020-11-19 DIAGNOSIS — I10 Essential (primary) hypertension: Secondary | ICD-10-CM | POA: Diagnosis not present

## 2020-11-19 DIAGNOSIS — M5136 Other intervertebral disc degeneration, lumbar region: Secondary | ICD-10-CM | POA: Diagnosis not present

## 2020-11-19 DIAGNOSIS — Z6841 Body Mass Index (BMI) 40.0 and over, adult: Secondary | ICD-10-CM | POA: Diagnosis not present

## 2020-11-19 DIAGNOSIS — Z01818 Encounter for other preprocedural examination: Secondary | ICD-10-CM | POA: Diagnosis not present

## 2020-11-19 DIAGNOSIS — F331 Major depressive disorder, recurrent, moderate: Secondary | ICD-10-CM | POA: Diagnosis not present

## 2020-11-19 DIAGNOSIS — R5383 Other fatigue: Secondary | ICD-10-CM | POA: Diagnosis not present

## 2020-11-19 DIAGNOSIS — E782 Mixed hyperlipidemia: Secondary | ICD-10-CM | POA: Diagnosis not present

## 2020-11-19 DIAGNOSIS — M48061 Spinal stenosis, lumbar region without neurogenic claudication: Secondary | ICD-10-CM | POA: Diagnosis not present

## 2020-11-19 DIAGNOSIS — R609 Edema, unspecified: Secondary | ICD-10-CM | POA: Diagnosis not present

## 2020-11-19 DIAGNOSIS — D649 Anemia, unspecified: Secondary | ICD-10-CM | POA: Diagnosis not present

## 2020-11-19 DIAGNOSIS — K219 Gastro-esophageal reflux disease without esophagitis: Secondary | ICD-10-CM | POA: Diagnosis not present

## 2020-11-19 DIAGNOSIS — Z1231 Encounter for screening mammogram for malignant neoplasm of breast: Secondary | ICD-10-CM | POA: Diagnosis not present

## 2020-11-19 DIAGNOSIS — E039 Hypothyroidism, unspecified: Secondary | ICD-10-CM | POA: Diagnosis not present

## 2020-11-19 DIAGNOSIS — R5381 Other malaise: Secondary | ICD-10-CM | POA: Diagnosis not present

## 2020-11-24 ENCOUNTER — Ambulatory Visit: Payer: Self-pay | Admitting: Orthopedic Surgery

## 2020-11-25 DIAGNOSIS — M25561 Pain in right knee: Secondary | ICD-10-CM | POA: Diagnosis not present

## 2020-11-25 DIAGNOSIS — M1711 Unilateral primary osteoarthritis, right knee: Secondary | ICD-10-CM | POA: Diagnosis not present

## 2020-12-01 ENCOUNTER — Ambulatory Visit (HOSPITAL_COMMUNITY): Payer: Self-pay | Admitting: Orthopedic Surgery

## 2020-12-01 DIAGNOSIS — Z6841 Body Mass Index (BMI) 40.0 and over, adult: Secondary | ICD-10-CM | POA: Diagnosis not present

## 2020-12-01 DIAGNOSIS — Z01818 Encounter for other preprocedural examination: Secondary | ICD-10-CM | POA: Diagnosis not present

## 2020-12-01 DIAGNOSIS — M47896 Other spondylosis, lumbar region: Secondary | ICD-10-CM | POA: Diagnosis not present

## 2020-12-01 DIAGNOSIS — M545 Low back pain, unspecified: Secondary | ICD-10-CM | POA: Diagnosis not present

## 2020-12-01 DIAGNOSIS — M546 Pain in thoracic spine: Secondary | ICD-10-CM | POA: Diagnosis not present

## 2020-12-01 NOTE — H&P (Deleted)
  The note originally documented on this encounter has been moved the the encounter in which it belongs.  

## 2020-12-01 NOTE — H&P (Signed)
The patient is here today for a pre-operative History and Physical. They are scheduled for ____ Spinal Cord Stimulator on 12-11-20 with Dr. Rolena [name] at [facility] is a very pleasant 75 year old woman who has had chronic debilitating back pain. Unfortunately conservative care had failed to alleviate her symptoms. She had a spinal cord stimulator trial that was quite successful in reducing her pain. As a result she presents today for definitive implantation.    Allergies  Allergen Reactions  . Cephalexin Rash  . Felodipine Rash    Current Outpatient Medications on File Prior to Visit  Medication Sig Dispense Refill  . ALPRAZolam (XANAX) 0.5 MG tablet Take 0.5 mg by mouth at bedtime as needed for anxiety or sleep.    Marland Kitchen FLUoxetine (PROZAC) 20 MG capsule Take 20 mg by mouth daily.    . furosemide (LASIX) 40 MG tablet Take 40 mg by mouth daily as needed for edema.    Marland Kitchen glycopyrrolate (ROBINUL) 1 MG tablet Take 1 mg by mouth daily.    Marland Kitchen levothyroxine (SYNTHROID) 88 MCG tablet Take 88 mcg by mouth daily before breakfast.    . omeprazole (PRILOSEC) 20 MG capsule Take 20 mg by mouth daily.    . pramipexole (MIRAPEX) 1 MG tablet Take 2 mg by mouth 3 (three) times daily.    . SUMAtriptan (IMITREX) 100 MG tablet Take 100 mg by mouth every 2 (two) hours as needed for migraine. May repeat in 2 hours if headache persists or recurs.    . valACYclovir (VALTREX) 1000 MG tablet Take 1,000 mg by mouth 3 (three) times daily as needed (shingles). Take for 7 days     No current facility-administered medications on file prior to visit.    Physical Exam:  Clinical exam: Kendra [name] is a pleasant individual, who appears younger than their stated age. She is alert and orientated 3. No shortness of breath, chest pain. Abdomen is soft and non-tender, negative loss of bowel and bladder control, no rebound tenderness. Negative: skin lesions abrasions contusions Peripheral pulses: 2+ dorsalis  pedis/posterior tibialis pulses bilaterally. Compartment soft and nontender. Cardiac: Regular rate and rhythm no rubs gallops murmurs  Lungs clear to auscultation bilaterally  Gait pattern: Normal Assistive devices: None Neuro: 5/5 motor strength in the lower extremities bilaterally. Positive restless leg syndrome with dysesthesias primarily into the right lower extremity. Sensation to light touch is intact. Negative straight leg raise test, no clonus, negative Babinski test. 1+ deep tendon reflexes at the knee, absent at the Achilles Musculoskeletal: Debilitating low back pain with palpation and attempts at range of motion. Pain is midline and radiates out to the paraspinal region.   Image: A/P:Thoracic x-rays demonstrate no significant pathology. Thoracic MRI: completed on 11/04/20 was reviewed with the patient. It was completed at [facility]; I have independently reviewed the images as well as the radiology report. Small disc protrusion without canal stenosis. No significant foraminal stenosis. No cord signal changes.   Assessment & Plan Diagnosis: Kendra [name] is a very pleasant 75 year old with chronic debilitating back pain. She has had physical therapy, injection therapy, activity modification, and despite these modalities her quality-of-life his continue to deteriorate. She recently had a trial spinal cord stimulator placement and she noted improvement in her quality-of-life   Treatment plan: I have gone over the surgical procedure in great detail with her and all of her questions were addressed. She is expressed an understanding of the risks, benefits, alternatives as well as a willingness to move forward with  surgery.Risks and benefits of surgery were discussed with the patient.   These include: Infection, bleeding, death, stroke, paralysis, ongoing or worse pain, need for additional surgery, leak of spinal fluid, Failure of the battery requiring reoperation. Inability to place the paddle  requiring the surgery to be aborted. Migration of the lead, failure to obtain results similar to the trial.    Patient has been medically cleared from the standpoint of her primary care physician

## 2020-12-05 NOTE — Pre-Procedure Instructions (Signed)
Surgical Instructions    Your procedure is scheduled on Thursday, May 19th.  Report to [facility] Main Entrance "A" at 7:45 A.M., then check in with the Admitting office.  Call this number if you have problems the morning of surgery:  [phone]   If you have any questions prior to your surgery date call [phone]: Open Monday-Friday 8am-4pm    Remember:  Do not eat after midnight the night before your surgery  You may drink clear liquids until 6:45 a.m. the morning of your surgery.   Clear liquids allowed are: Water, Non-Citrus Juices (without pulp), Carbonated Beverages, Clear Tea, Black Coffee Only, and Gatorade    Take these medicines the morning of surgery with A SIP OF WATER  FLUoxetine (PROZAC) levothyroxine (SYNTHROID)  omeprazole (PRILOSEC)  pramipexole (MIRAPEX) glycopyrrolate (ROBINUL) SUMAtriptan (IMITREX)-as needed  As of today, STOP taking any Aspirin (unless otherwise instructed by your surgeon) Aleve, Naproxen, Ibuprofen, Motrin, Advil, Goody's, BC's, all herbal medications, fish oil, and all vitamins.                     Do NOT Smoke (Tobacco/Vaping) or drink Alcohol 24 hours prior to your procedure.  If you use a CPAP at night, you may bring all equipment for your overnight stay.   Contacts, glasses, piercing's, hearing aid's, dentures or partials may not be worn into surgery, please bring cases for these belongings.    For patients admitted to the hospital, discharge time will be determined by your treatment team.   Patients discharged the day of surgery will not be allowed to drive home, and someone needs to stay with them for 24 hours.    Special instructions:   [facility]- Preparing For Surgery  Before surgery, you can play an important role. Because skin is not sterile, your skin needs to be as free of germs as possible. You can reduce the number of germs on your skin by washing with CHG (chlorahexidine gluconate) Soap before surgery.  CHG is an  antiseptic cleaner which kills germs and bonds with the skin to continue killing germs even after washing.    Oral Hygiene is also important to reduce your risk of infection.  Remember - BRUSH YOUR TEETH THE MORNING OF SURGERY WITH YOUR REGULAR TOOTHPASTE  Please do not use if you have an allergy to CHG or antibacterial soaps. If your skin becomes reddened/irritated stop using the CHG.  Do not shave (including legs and underarms) for at least 48 hours prior to first CHG shower. It is OK to shave your face.  Please follow these instructions carefully.   1. Shower the NIGHT BEFORE SURGERY and the MORNING OF SURGERY  2. If you chose to wash your hair, wash your hair first as usual with your normal shampoo.  3. After you shampoo, rinse your hair and body thoroughly to remove the shampoo.  4. Use CHG Soap as you would any other liquid soap. You can apply CHG directly to the skin and wash gently with a scrungie or a clean washcloth.   5. Apply the CHG Soap to your body ONLY FROM THE NECK DOWN.  Do not use on open wounds or open sores. Avoid contact with your eyes, ears, mouth and genitals (private parts). Wash Face and genitals (private parts)  with your normal soap.   6. Wash thoroughly, paying special attention to the area where your surgery will be performed.  7. Thoroughly rinse your body with warm water from the neck  down.  8. DO NOT shower/wash with your normal soap after using and rinsing off the CHG Soap.  9. Pat yourself dry with a CLEAN TOWEL.  10. Wear CLEAN PAJAMAS to bed the night before surgery  11. Place CLEAN SHEETS on your bed the night before your surgery  12. DO NOT SLEEP WITH PETS.   Day of Surgery: Shower with CHG soap. Do not wear jewelry, make up, or nail polish Do not wear lotions, powders, perfumes, or deodorant. Do not shave 48 hours prior to surgery.  Do not bring valuables to the hospital. [facility] is not responsible for any belongings or  valuables. Wear Clean/Comfortable clothing the morning of surgery Remember to brush your teeth WITH YOUR REGULAR TOOTHPASTE.   Please read over the following fact sheets that you were given.

## 2020-12-08 ENCOUNTER — Encounter (HOSPITAL_COMMUNITY)
Admission: RE | Admit: 2020-12-08 | Discharge: 2020-12-08 | Disposition: A | Discharge status: Home / Self Care | Payer: Medicare Other | Source: Ambulatory Visit | Attending: Orthopedic Surgery | Admitting: Orthopedic Surgery

## 2020-12-08 ENCOUNTER — Other Ambulatory Visit: Payer: Self-pay

## 2020-12-08 ENCOUNTER — Ambulatory Visit (HOSPITAL_COMMUNITY)
Admission: RE | Admit: 2020-12-08 | Discharge: 2020-12-08 | Disposition: A | Discharge status: Home / Self Care | Payer: Medicare Other | Source: Ambulatory Visit | Attending: Orthopedic Surgery | Admitting: Orthopedic Surgery

## 2020-12-08 ENCOUNTER — Encounter (HOSPITAL_COMMUNITY): Payer: Self-pay

## 2020-12-08 DIAGNOSIS — Z79899 Other long term (current) drug therapy: Secondary | ICD-10-CM | POA: Insufficient documentation

## 2020-12-08 DIAGNOSIS — K449 Diaphragmatic hernia without obstruction or gangrene: Secondary | ICD-10-CM | POA: Insufficient documentation

## 2020-12-08 DIAGNOSIS — Z87891 Personal history of nicotine dependence: Secondary | ICD-10-CM | POA: Insufficient documentation

## 2020-12-08 DIAGNOSIS — E039 Hypothyroidism, unspecified: Secondary | ICD-10-CM | POA: Insufficient documentation

## 2020-12-08 DIAGNOSIS — E785 Hyperlipidemia, unspecified: Secondary | ICD-10-CM | POA: Insufficient documentation

## 2020-12-08 DIAGNOSIS — K219 Gastro-esophageal reflux disease without esophagitis: Secondary | ICD-10-CM | POA: Diagnosis not present

## 2020-12-08 DIAGNOSIS — G894 Chronic pain syndrome: Secondary | ICD-10-CM | POA: Diagnosis not present

## 2020-12-08 DIAGNOSIS — Z20822 Contact with and (suspected) exposure to covid-19: Secondary | ICD-10-CM | POA: Diagnosis not present

## 2020-12-08 DIAGNOSIS — G4733 Obstructive sleep apnea (adult) (pediatric): Secondary | ICD-10-CM | POA: Diagnosis not present

## 2020-12-08 DIAGNOSIS — Z01818 Encounter for other preprocedural examination: Secondary | ICD-10-CM | POA: Diagnosis not present

## 2020-12-08 DIAGNOSIS — R9431 Abnormal electrocardiogram [ECG] [EKG]: Secondary | ICD-10-CM | POA: Insufficient documentation

## 2020-12-08 HISTORY — DX: Anemia, unspecified: D64.9

## 2020-12-08 HISTORY — DX: Morbid (severe) obesity due to excess calories: E66.01

## 2020-12-08 HISTORY — DX: Other intervertebral disc degeneration, lumbar region without mention of lumbar back pain or lower extremity pain: M51.369

## 2020-12-08 HISTORY — DX: Depression, unspecified: F32.A

## 2020-12-08 HISTORY — DX: Hypothyroidism, unspecified: E03.9

## 2020-12-08 HISTORY — DX: Gastro-esophageal reflux disease without esophagitis: K21.9

## 2020-12-08 HISTORY — DX: Sleep apnea, unspecified: G47.30

## 2020-12-08 HISTORY — DX: Essential (primary) hypertension: I10

## 2020-12-08 HISTORY — DX: Prediabetes: R73.03

## 2020-12-08 HISTORY — DX: Mixed hyperlipidemia: E78.2

## 2020-12-08 HISTORY — DX: Localized edema: R60.0

## 2020-12-08 HISTORY — DX: Other reaction to spinal and lumbar puncture: G97.1

## 2020-12-08 HISTORY — DX: Spinal stenosis, lumbar region without neurogenic claudication: M48.061

## 2020-12-08 HISTORY — DX: Restless legs syndrome: G25.81

## 2020-12-08 HISTORY — DX: Edema, unspecified: R60.9

## 2020-12-08 HISTORY — DX: Other intervertebral disc degeneration, lumbar region: M51.36

## 2020-12-08 LAB — URINALYSIS, ROUTINE W REFLEX MICROSCOPIC
Bilirubin Urine: NEGATIVE
Glucose, UA: NEGATIVE mg/dL
Hgb urine dipstick: NEGATIVE
Ketones, ur: NEGATIVE mg/dL
Leukocytes,Ua: NEGATIVE
Nitrite: NEGATIVE
Protein, ur: NEGATIVE mg/dL
Specific Gravity, Urine: 1.006 (ref 1.005–1.030)
pH: 7 (ref 5.0–8.0)

## 2020-12-08 LAB — PROTIME-INR
INR: 1.1 (ref 0.8–1.2)
Prothrombin Time: 13.7 seconds (ref 11.4–15.2)

## 2020-12-08 LAB — COMPREHENSIVE METABOLIC PANEL
ALT: 21 U/L (ref 0–44)
AST: 19 U/L (ref 15–41)
Albumin: 3.9 g/dL (ref 3.5–5.0)
Alkaline Phosphatase: 75 U/L (ref 38–126)
Anion gap: 8 (ref 5–15)
BUN: 10 mg/dL (ref 8–23)
CO2: 28 mmol/L (ref 22–32)
Calcium: 9.2 mg/dL (ref 8.9–10.3)
Chloride: 101 mmol/L (ref 98–111)
Creatinine, Ser: 0.63 mg/dL (ref 0.44–1.00)
GFR, Estimated: >60 mL/min (ref >60)
Glucose, Bld: 91 mg/dL (ref 70–99)
Potassium: 3.8 mmol/L (ref 3.5–5.1)
Sodium: 137 mmol/L (ref 135–145)
Total Bilirubin: 0.5 mg/dL (ref 0.3–1.2)
Total Protein: 6.9 g/dL (ref 6.5–8.1)

## 2020-12-08 LAB — CBC
HCT: 37.6 % (ref 36.0–46.0)
Hemoglobin: 11.3 g/dL — ABNORMAL LOW (ref 12.0–15.0)
MCH: 23.2 pg — ABNORMAL LOW (ref 26.0–34.0)
MCHC: 30.1 g/dL (ref 30.0–36.0)
MCV: 77.2 fL — ABNORMAL LOW (ref 80.0–100.0)
Platelets: 299 10*3/uL (ref 150–400)
RBC: 4.87 MIL/uL (ref 3.87–5.11)
RDW: 18.4 % — ABNORMAL HIGH (ref 11.5–15.5)
WBC: 10.3 10*3/uL (ref 4.0–10.5)
nRBC: 0 % (ref 0.0–0.2)

## 2020-12-08 LAB — APTT: aPTT: 27 seconds (ref 24–36)

## 2020-12-08 LAB — SURGICAL PCR SCREEN
MRSA, PCR: NEGATIVE
Staphylococcus aureus: POSITIVE — AB

## 2020-12-08 NOTE — Progress Notes (Signed)
PCP - Dr. Gilford Rile Cardiologist - denies  Chest x-ray - 12/08/20 EKG - 12/08/20 Stress Test - 10+ years ago ECHO - denies Cardiac Cath - denies  Sleep Study - OSA+  CPAP - uses nightly   Blood Thinner Instructions: n/a Aspirin Instructions: n/a  ERAS Protcol - Clear liquids until 0645 DOS PRE-SURGERY Ensure or G2- none ordered, ERAS ordered per anesthesia protocol.   COVID TEST- 12/08/20; pt aware to quarantine until DOS.   Anesthesia review: Yes  Patient denies shortness of breath, fever, cough and chest pain at PAT appointment   All instructions explained to the patient, with a verbal understanding of the material. Patient agrees to go over the instructions while at home for a better understanding. Patient also instructed to self quarantine after being tested for COVID-19. The opportunity to ask questions was provided.

## 2020-12-09 LAB — SARS CORONAVIRUS 2 (TAT 6-24 HRS): SARS Coronavirus 2: NEGATIVE

## 2020-12-09 NOTE — Progress Notes (Signed)
Anesthesia Chart Review:  Case: [id number] Date/Time: 12/11/20 0936   Procedure: SPINAL CORD STIMULATOR INSERTION (N/A )   Anesthesia type: General   Pre-op diagnosis: Chronic pain syndrome   Location: [facility] OR ROOM 04 / [place] OR   Surgeons: Melina Schools, MD      DISCUSSION: Patient is a 75 year old female scheduled for the above procedure.   History includes former smoker, spinal headache (after epidural during childbirth), HTN, hypothyroidism, GERD, HLD, pre-diabetes, OSA (CPAP), peripheral edema, anemia.  Patient evaluated on 11/19/20 by Kathrynn Ducking, [place] for preoperative evaluation (see Care Everywhere). Per H&P, Dr. Rolena Infante did receive medical clearance for surgery.    12/08/20 presurgical COVID-19 test negative. Anesthesia team to evaluate on the day of surgery.   VS: BP (!) 144/82   Pulse 84   Temp 37.1 C (Oral)   Resp 20   Ht 5\' 8"  (1.727 m)   Wt 131.3 kg   SpO2 96%   BMI 44.02 kg/m   PROVIDERS: Raina Mina., MD is PCP    LABS: Labs reviewed: Acceptable for surgery. A1c 5.6% 11/19/20 [facility]). (all labs ordered are listed, but only abnormal results are displayed)  Labs Reviewed  SURGICAL PCR SCREEN - Abnormal; Notable for the following components:      Result Value   Staphylococcus aureus POSITIVE (*)    All other components within normal limits  URINALYSIS, ROUTINE W REFLEX MICROSCOPIC - Abnormal; Notable for the following components:   Color, Urine STRAW (*)    All other components within normal limits  CBC - Abnormal; Notable for the following components:   Hemoglobin 11.3 (*)    MCV 77.2 (*)    MCH 23.2 (*)    RDW 18.4 (*)    All other components within normal limits  SARS CORONAVIRUS 2 (TAT 6-24 HRS)  COMPREHENSIVE METABOLIC PANEL  PROTIME-INR  APTT     IMAGES: CXR 12/08/20: FINDINGS: Lung volumes are normal. No consolidative airspace disease. No pleural effusions. No pneumothorax. No pulmonary nodule or mass noted. Large hiatal hernia. Pulmonary  vasculature and the cardiomediastinal silhouette are otherwise within normal limits. IMPRESSION: 1. No radiographic evidence of acute cardiopulmonary disease. 2. Large hiatal hernia.   EKG: 12/08/20: Sinus rhythm with occasional Premature ventricular complexes Cannot rule out Anterior infarct , age undetermined Abnormal ECG No previous tracing Confirmed by Shelva Majestic [phone]) on 12/08/2020 1:24:59 PM   CV: N/A  Past Medical History:  Diagnosis Date  . Anemia   . Degenerative disc disease, lumbar   . Depression   . GERD (gastroesophageal reflux disease)   . Hypertension   . Hypothyroidism   . Mixed hyperlipidemia   . Morbid obesity ([place])   . Peripheral edema   . Pre-diabetes   . Restless leg syndrome   . Sleep apnea    CPAP  . Spinal headache    from epidural during childbirth  . Spinal stenosis at L4-L5 level     Past Surgical History:  Procedure Laterality Date  . ABDOMINAL HYSTERECTOMY    . CESAREAN SECTION     x2    MEDICATIONS: . ALPRAZolam (XANAX) 0.5 MG tablet  . FLUoxetine (PROZAC) 20 MG capsule  . furosemide (LASIX) 40 MG tablet  . glycopyrrolate (ROBINUL) 1 MG tablet  . levothyroxine (SYNTHROID) 88 MCG tablet  . omeprazole (PRILOSEC) 20 MG capsule  . pramipexole (MIRAPEX) 1 MG tablet  . SUMAtriptan (IMITREX) 100 MG tablet  . valACYclovir (VALTREX) 1000 MG tablet   No current  facility-administered medications for this encounter.    Myra Gianotti, PA-C Surgical Short Stay/Anesthesiology [facility] Phone [phone] [facility] Phone [phone] 12/09/2020 4:48 PM

## 2020-12-11 ENCOUNTER — Ambulatory Visit (HOSPITAL_COMMUNITY): Payer: Medicare Other

## 2020-12-11 ENCOUNTER — Encounter (HOSPITAL_COMMUNITY)
Admission: RE | Disposition: A | Discharge status: Home / Self Care | Payer: Self-pay | Source: Home / Self Care | Attending: Orthopedic Surgery

## 2020-12-11 ENCOUNTER — Ambulatory Visit (HOSPITAL_COMMUNITY): Payer: Medicare Other | Admitting: Anesthesiology

## 2020-12-11 ENCOUNTER — Observation Stay (HOSPITAL_COMMUNITY)
Admission: RE | Admit: 2020-12-11 | Discharge: 2020-12-12 | Disposition: A | Discharge status: Home / Self Care | Payer: Medicare Other | Attending: Orthopedic Surgery | Admitting: Orthopedic Surgery

## 2020-12-11 ENCOUNTER — Other Ambulatory Visit: Payer: Self-pay

## 2020-12-11 ENCOUNTER — Ambulatory Visit (HOSPITAL_COMMUNITY): Payer: Medicare Other | Admitting: Physician Assistant

## 2020-12-11 ENCOUNTER — Encounter (HOSPITAL_COMMUNITY): Payer: Self-pay | Admitting: Orthopedic Surgery

## 2020-12-11 DIAGNOSIS — G8929 Other chronic pain: Secondary | ICD-10-CM | POA: Diagnosis present

## 2020-12-11 DIAGNOSIS — E039 Hypothyroidism, unspecified: Secondary | ICD-10-CM | POA: Diagnosis not present

## 2020-12-11 DIAGNOSIS — E782 Mixed hyperlipidemia: Secondary | ICD-10-CM | POA: Diagnosis not present

## 2020-12-11 DIAGNOSIS — G894 Chronic pain syndrome: Principal | ICD-10-CM | POA: Insufficient documentation

## 2020-12-11 DIAGNOSIS — R7303 Prediabetes: Secondary | ICD-10-CM | POA: Diagnosis not present

## 2020-12-11 DIAGNOSIS — Z79899 Other long term (current) drug therapy: Secondary | ICD-10-CM | POA: Diagnosis not present

## 2020-12-11 DIAGNOSIS — Z981 Arthrodesis status: Secondary | ICD-10-CM | POA: Diagnosis not present

## 2020-12-11 DIAGNOSIS — Z419 Encounter for procedure for purposes other than remedying health state, unspecified: Secondary | ICD-10-CM

## 2020-12-11 DIAGNOSIS — D638 Anemia in other chronic diseases classified elsewhere: Secondary | ICD-10-CM | POA: Diagnosis not present

## 2020-12-11 DIAGNOSIS — I10 Essential (primary) hypertension: Secondary | ICD-10-CM | POA: Insufficient documentation

## 2020-12-11 DIAGNOSIS — R262 Difficulty in walking, not elsewhere classified: Secondary | ICD-10-CM | POA: Diagnosis not present

## 2020-12-11 HISTORY — PX: SPINAL CORD STIMULATOR INSERTION: SHX5378

## 2020-12-11 HISTORY — DX: Other chronic pain: G89.29

## 2020-12-11 LAB — GLUCOSE, CAPILLARY: Glucose-Capillary: 94 mg/dL (ref 70–99)

## 2020-12-11 SURGERY — INSERTION, SPINAL CORD STIMULATOR, LUMBAR
Anesthesia: General | Site: Back

## 2020-12-11 MED ORDER — PHENYLEPHRINE 40 MCG/ML (10ML) SYRINGE FOR IV PUSH (FOR BLOOD PRESSURE SUPPORT)
PREFILLED_SYRINGE | INTRAVENOUS | Status: AC
  Filled 2020-12-11: qty 10

## 2020-12-11 MED ORDER — HYDROMORPHONE HCL 1 MG/ML IJ SOLN
1.0000 mg | INTRAMUSCULAR | Status: AC | PRN
  Administered 2020-12-11 (×2): 1 mg via INTRAVENOUS
  Filled 2020-12-11 (×2): qty 1

## 2020-12-11 MED ORDER — METHOCARBAMOL 1000 MG/10ML IJ SOLN
500.0000 mg | Freq: Four times a day (QID) | INTRAVENOUS | Status: DC | PRN
  Filled 2020-12-11: qty 5

## 2020-12-11 MED ORDER — CEFAZOLIN SODIUM-DEXTROSE 1-4 GM/50ML-% IV SOLN
1.0000 g | Freq: Three times a day (TID) | INTRAVENOUS | Status: AC
  Administered 2020-12-11 (×2): 1 g via INTRAVENOUS
  Filled 2020-12-11 (×2): qty 50

## 2020-12-11 MED ORDER — FLUOXETINE HCL 20 MG PO CAPS
20.0000 mg | ORAL_CAPSULE | Freq: Every day | ORAL | Status: DC
  Administered 2020-12-11 – 2020-12-12 (×2): 20 mg via ORAL
  Filled 2020-12-11 (×2): qty 1

## 2020-12-11 MED ORDER — OXYCODONE-ACETAMINOPHEN 10-325 MG PO TABS: 1.0000 | ORAL_TABLET | Freq: Four times a day (QID) | ORAL | 0 refills | Status: AC | PRN

## 2020-12-11 MED ORDER — GLYCOPYRROLATE 1 MG PO TABS
1.0000 mg | ORAL_TABLET | Freq: Every day | ORAL | Status: DC
  Administered 2020-12-11: 1 mg via ORAL
  Filled 2020-12-11 (×2): qty 1

## 2020-12-11 MED ORDER — OXYCODONE HCL 5 MG PO TABS
10.0000 mg | ORAL_TABLET | ORAL | Status: DC | PRN
  Administered 2020-12-11 – 2020-12-12 (×7): 10 mg via ORAL
  Filled 2020-12-11 (×7): qty 2

## 2020-12-11 MED ORDER — CHLORHEXIDINE GLUCONATE 0.12 % MT SOLN
15.0000 mL | Freq: Once | OROMUCOSAL | Status: AC
  Administered 2020-12-11: 15 mL via OROMUCOSAL
  Filled 2020-12-11: qty 15

## 2020-12-11 MED ORDER — ONDANSETRON HCL 4 MG PO TABS: 4.0000 mg | ORAL_TABLET | Freq: Three times a day (TID) | ORAL | 0 refills | Status: DC | PRN

## 2020-12-11 MED ORDER — ALPRAZOLAM 0.5 MG PO TABS
0.5000 mg | ORAL_TABLET | Freq: Every evening | ORAL | Status: DC | PRN
  Administered 2020-12-12: 0.5 mg via ORAL
  Filled 2020-12-11: qty 1

## 2020-12-11 MED ORDER — WHITE PETROLATUM EX OINT
TOPICAL_OINTMENT | CUTANEOUS | Status: AC
  Administered 2020-12-11: 0.2
  Filled 2020-12-11: qty 28.35

## 2020-12-11 MED ORDER — ACETAMINOPHEN 650 MG RE SUPP
650.0000 mg | RECTAL | Status: DC | PRN
Start: 2020-12-11 — End: 2020-12-12

## 2020-12-11 MED ORDER — HEMOSTATIC AGENTS (NO CHARGE) OPTIME
TOPICAL | Status: DC | PRN
  Administered 2020-12-11: 1 via TOPICAL

## 2020-12-11 MED ORDER — PROMETHAZINE HCL 25 MG/ML IJ SOLN: 6.2500 mg | INTRAMUSCULAR | Status: DC | PRN

## 2020-12-11 MED ORDER — PROPOFOL 10 MG/ML IV BOLUS
INTRAVENOUS | Status: AC
  Filled 2020-12-11: qty 20

## 2020-12-11 MED ORDER — BUPIVACAINE-EPINEPHRINE (PF) 0.25% -1:200000 IJ SOLN
INTRAMUSCULAR | Status: AC
  Filled 2020-12-11: qty 30

## 2020-12-11 MED ORDER — ACETAMINOPHEN 10 MG/ML IV SOLN
INTRAVENOUS | Status: AC
  Administered 2020-12-11: 1000 mg via INTRAVENOUS
  Filled 2020-12-11: qty 100

## 2020-12-11 MED ORDER — CEFAZOLIN SODIUM-DEXTROSE 2-4 GM/100ML-% IV SOLN
2.0000 g | INTRAVENOUS | Status: AC
  Administered 2020-12-11: 2 g via INTRAVENOUS
  Filled 2020-12-11: qty 100

## 2020-12-11 MED ORDER — DEXAMETHASONE SODIUM PHOSPHATE 10 MG/ML IJ SOLN
INTRAMUSCULAR | Status: DC | PRN
  Administered 2020-12-11: 4 mg via INTRAVENOUS

## 2020-12-11 MED ORDER — FENTANYL CITRATE (PF) 250 MCG/5ML IJ SOLN
INTRAMUSCULAR | Status: AC
  Filled 2020-12-11: qty 5

## 2020-12-11 MED ORDER — PRAMIPEXOLE DIHYDROCHLORIDE 0.25 MG PO TABS
2.0000 mg | ORAL_TABLET | Freq: Three times a day (TID) | ORAL | Status: DC
  Administered 2020-12-11: 1 mg via ORAL
  Administered 2020-12-11 – 2020-12-12 (×2): 2 mg via ORAL
  Filled 2020-12-11 (×3): qty 8

## 2020-12-11 MED ORDER — 0.9 % SODIUM CHLORIDE (POUR BTL) OPTIME
TOPICAL | Status: DC | PRN
  Administered 2020-12-11: 1000 mL

## 2020-12-11 MED ORDER — BUPIVACAINE-EPINEPHRINE 0.25% -1:200000 IJ SOLN
INTRAMUSCULAR | Status: DC | PRN
  Administered 2020-12-11: 20 mL

## 2020-12-11 MED ORDER — OXYCODONE HCL 5 MG PO TABS
5.0000 mg | ORAL_TABLET | ORAL | Status: DC | PRN
Start: 2020-12-11 — End: 2020-12-12

## 2020-12-11 MED ORDER — LIDOCAINE 2% (20 MG/ML) 5 ML SYRINGE
INTRAMUSCULAR | Status: AC
  Filled 2020-12-11: qty 5

## 2020-12-11 MED ORDER — ROCURONIUM BROMIDE 10 MG/ML (PF) SYRINGE
PREFILLED_SYRINGE | INTRAVENOUS | Status: AC
  Filled 2020-12-11: qty 10

## 2020-12-11 MED ORDER — TRANEXAMIC ACID-NACL 1000-0.7 MG/100ML-% IV SOLN
1000.0000 mg | INTRAVENOUS | Status: AC
  Administered 2020-12-11: 1000 mg via INTRAVENOUS
  Filled 2020-12-11: qty 100

## 2020-12-11 MED ORDER — ONDANSETRON HCL 4 MG/2ML IJ SOLN
INTRAMUSCULAR | Status: AC
  Filled 2020-12-11: qty 2

## 2020-12-11 MED ORDER — ORAL CARE MOUTH RINSE: 15.0000 mL | Freq: Once | OROMUCOSAL | Status: AC

## 2020-12-11 MED ORDER — LIDOCAINE 2% (20 MG/ML) 5 ML SYRINGE
INTRAMUSCULAR | Status: DC | PRN
  Administered 2020-12-11: 80 mg via INTRAVENOUS

## 2020-12-11 MED ORDER — SODIUM CHLORIDE 0.9% FLUSH
3.0000 mL | Freq: Two times a day (BID) | INTRAVENOUS | Status: DC
  Administered 2020-12-11: 3 mL via INTRAVENOUS

## 2020-12-11 MED ORDER — FUROSEMIDE 40 MG PO TABS
40.0000 mg | ORAL_TABLET | Freq: Every day | ORAL | Status: DC | PRN
  Administered 2020-12-12: 40 mg via ORAL
  Filled 2020-12-11: qty 1

## 2020-12-11 MED ORDER — BUPIVACAINE LIPOSOME 1.3 % IJ SUSP
INTRAMUSCULAR | Status: AC
  Filled 2020-12-11: qty 20

## 2020-12-11 MED ORDER — GLYCOPYRROLATE PF 0.2 MG/ML IJ SOSY
PREFILLED_SYRINGE | INTRAMUSCULAR | Status: DC | PRN
  Administered 2020-12-11: .2 mg via INTRAVENOUS

## 2020-12-11 MED ORDER — ONDANSETRON HCL 4 MG/2ML IJ SOLN: 4.0000 mg | Freq: Four times a day (QID) | INTRAMUSCULAR | Status: DC | PRN

## 2020-12-11 MED ORDER — BUPIVACAINE LIPOSOME 1.3 % IJ SUSP
INTRAMUSCULAR | Status: DC | PRN
  Administered 2020-12-11: 20 mL

## 2020-12-11 MED ORDER — METHOCARBAMOL 500 MG PO TABS: 500.0000 mg | ORAL_TABLET | Freq: Three times a day (TID) | ORAL | 0 refills | Status: AC | PRN

## 2020-12-11 MED ORDER — MENTHOL 3 MG MT LOZG: 1.0000 | LOZENGE | OROMUCOSAL | Status: DC | PRN

## 2020-12-11 MED ORDER — SODIUM CHLORIDE 0.9% FLUSH: 3.0000 mL | INTRAVENOUS | Status: DC | PRN

## 2020-12-11 MED ORDER — FENTANYL CITRATE (PF) 100 MCG/2ML IJ SOLN
25.0000 ug | INTRAMUSCULAR | Status: DC | PRN
  Administered 2020-12-11: 50 ug via INTRAVENOUS

## 2020-12-11 MED ORDER — ACETAMINOPHEN 10 MG/ML IV SOLN: 1000.0000 mg | Freq: Once | INTRAVENOUS | Status: DC | PRN

## 2020-12-11 MED ORDER — METHOCARBAMOL 500 MG PO TABS
500.0000 mg | ORAL_TABLET | Freq: Four times a day (QID) | ORAL | Status: DC | PRN
  Administered 2020-12-11 – 2020-12-12 (×4): 500 mg via ORAL
  Filled 2020-12-11 (×4): qty 1

## 2020-12-11 MED ORDER — ONDANSETRON HCL 4 MG/2ML IJ SOLN
INTRAMUSCULAR | Status: DC | PRN
  Administered 2020-12-11: 4 mg via INTRAVENOUS

## 2020-12-11 MED ORDER — LACTATED RINGERS IV SOLN: INTRAVENOUS | Status: DC

## 2020-12-11 MED ORDER — PHENOL 1.4 % MT LIQD: 1.0000 | OROMUCOSAL | Status: DC | PRN

## 2020-12-11 MED ORDER — PROPOFOL 10 MG/ML IV BOLUS
INTRAVENOUS | Status: DC | PRN
  Administered 2020-12-11: 180 mg via INTRAVENOUS

## 2020-12-11 MED ORDER — ROCURONIUM BROMIDE 10 MG/ML (PF) SYRINGE
PREFILLED_SYRINGE | INTRAVENOUS | Status: DC | PRN
  Administered 2020-12-11: 60 mg via INTRAVENOUS
  Administered 2020-12-11: 20 mg via INTRAVENOUS

## 2020-12-11 MED ORDER — ONDANSETRON HCL 4 MG PO TABS: 4.0000 mg | ORAL_TABLET | Freq: Four times a day (QID) | ORAL | Status: DC | PRN

## 2020-12-11 MED ORDER — DEXAMETHASONE SODIUM PHOSPHATE 10 MG/ML IJ SOLN
INTRAMUSCULAR | Status: AC
  Filled 2020-12-11: qty 1

## 2020-12-11 MED ORDER — FENTANYL CITRATE (PF) 100 MCG/2ML IJ SOLN
INTRAMUSCULAR | Status: AC
  Administered 2020-12-11: 50 ug via INTRAVENOUS
  Filled 2020-12-11: qty 2

## 2020-12-11 MED ORDER — ACETAMINOPHEN 325 MG PO TABS
650.0000 mg | ORAL_TABLET | ORAL | Status: DC | PRN
  Administered 2020-12-12: 650 mg via ORAL
  Filled 2020-12-11: qty 2

## 2020-12-11 MED ORDER — FENTANYL CITRATE (PF) 250 MCG/5ML IJ SOLN
INTRAMUSCULAR | Status: DC | PRN
  Administered 2020-12-11 (×5): 50 ug via INTRAVENOUS

## 2020-12-11 MED ORDER — PHENYLEPHRINE 40 MCG/ML (10ML) SYRINGE FOR IV PUSH (FOR BLOOD PRESSURE SUPPORT)
PREFILLED_SYRINGE | INTRAVENOUS | Status: DC | PRN
  Administered 2020-12-11 (×3): 80 ug via INTRAVENOUS

## 2020-12-11 MED ORDER — LEVOTHYROXINE SODIUM 88 MCG PO TABS
88.0000 ug | ORAL_TABLET | Freq: Every day | ORAL | Status: DC
  Administered 2020-12-12: 88 ug via ORAL
  Filled 2020-12-11: qty 1

## 2020-12-11 MED ORDER — PHENYLEPHRINE HCL-NACL 10-0.9 MG/250ML-% IV SOLN
INTRAVENOUS | Status: DC | PRN
  Administered 2020-12-11: 25 ug/min via INTRAVENOUS

## 2020-12-11 SURGICAL SUPPLY — 61 items
AGENT HMST KT MTR STRL THRMB (HEMOSTASIS) ×1
ANCH LD SWIFT-LCK ×2 IMPLANT
ANCHOR SWIFT LOCK ×2 IMPLANT
CANISTER SUCT 3000ML PPV (MISCELLANEOUS) ×2 IMPLANT
CLSR STERI-STRIP ANTIMIC 1/2X4 (GAUZE/BANDAGES/DRESSINGS) ×2 IMPLANT
COVER MAYO STAND STRL (DRAPES) ×2 IMPLANT
COVER PROBE W GEL 5X96 (DRAPES) IMPLANT
COVER SURGICAL LIGHT HANDLE (MISCELLANEOUS) ×2 IMPLANT
COVER WAND RF STERILE (DRAPES) IMPLANT
DRAPE C-ARM 42X72 X-RAY (DRAPES) ×2 IMPLANT
DRAPE SURG 17X23 STRL (DRAPES) ×1 IMPLANT
DRAPE U-SHAPE 47X51 STRL (DRAPES) ×2 IMPLANT
DRSG OPSITE POSTOP 4X6 (GAUZE/BANDAGES/DRESSINGS) ×3 IMPLANT
DURAPREP 26ML APPLICATOR (WOUND CARE) ×2 IMPLANT
ELECT BLADE 4.0 EZ CLEAN MEGAD (MISCELLANEOUS)
ELECT CAUTERY BLADE 6.4 (BLADE) IMPLANT
ELECT PENCIL ROCKER SW 15FT (MISCELLANEOUS) ×2 IMPLANT
ELECT REM PT RETURN 9FT ADLT (ELECTROSURGICAL) ×2
ELECTRODE BLDE 4.0 EZ CLN MEGD (MISCELLANEOUS) IMPLANT
ELECTRODE REM PT RTRN 9FT ADLT (ELECTROSURGICAL) ×1 IMPLANT
GENERATOR PULSE PROCLAIM 5ELIT (Neuro Prosthesis/Implant) IMPLANT
GLOVE BIO SURGEON STRL SZ7 (GLOVE) ×2 IMPLANT
GLOVE BIOGEL PI IND STRL 8.5 (GLOVE) ×1 IMPLANT
GLOVE BIOGEL PI INDICATOR 8.5 (GLOVE) ×1
GLOVE SS N UNI LF 8.5 STRL (GLOVE) ×2 IMPLANT
GLOVE SURG UNDER POLY LF SZ7 (GLOVE) ×2 IMPLANT
GOWN STRL REUS W/ TWL LRG LVL3 (GOWN DISPOSABLE) ×2 IMPLANT
GOWN STRL REUS W/TWL 2XL LVL3 (GOWN DISPOSABLE) ×2 IMPLANT
GOWN STRL REUS W/TWL LRG LVL3 (GOWN DISPOSABLE) ×4
KIT BASIN OR (CUSTOM PROCEDURE TRAY) ×2 IMPLANT
KIT TURNOVER KIT B (KITS) ×2 IMPLANT
LEAD OCTRODE GEN 8CH 60CM (Lead) ×2 IMPLANT
NDL SPNL 18GX3.5 QUINCKE PK (NEEDLE) ×1 IMPLANT
NEEDLE 22X1 1/2 (OR ONLY) (NEEDLE) ×2 IMPLANT
NEEDLE SPNL 18GX3.5 QUINCKE PK (NEEDLE) ×4 IMPLANT
NS IRRIG 1000ML POUR BTL (IV SOLUTION) ×2 IMPLANT
PACK LAMINECTOMY ORTHO (CUSTOM PROCEDURE TRAY) ×2 IMPLANT
PACK UNIVERSAL I (CUSTOM PROCEDURE TRAY) ×2 IMPLANT
PAD ARMBOARD 7.5X6 YLW CONV (MISCELLANEOUS) ×7 IMPLANT
PROGRAMMER DBS W/MAGNET NMRI (MISCELLANEOUS) ×1 IMPLANT
PULSE GENERATOR PROCLAIM 5ELIT (Neuro Prosthesis/Implant) ×2 IMPLANT
SPATULA SILICONE BRAIN 10MM (MISCELLANEOUS) IMPLANT
SPONGE LAP 4X18 RFD (DISPOSABLE) ×1 IMPLANT
SPONGE SURGIFOAM ABS GEL 100 (HEMOSTASIS) ×2 IMPLANT
STAPLER VISISTAT 35W (STAPLE) IMPLANT
SURGIFLO W/THROMBIN 8M KIT (HEMOSTASIS) ×2 IMPLANT
SUT BONE WAX W31G (SUTURE) ×2 IMPLANT
SUT ETHIBOND 2 OS 4 DA (SUTURE) ×2 IMPLANT
SUT MNCRL AB 3-0 PS2 18 (SUTURE) ×4 IMPLANT
SUT VIC AB 0 CT1 27 (SUTURE) ×2
SUT VIC AB 0 CT1 27XBRD ANBCTR (SUTURE) IMPLANT
SUT VIC AB 1 CT1 18XCR BRD 8 (SUTURE) ×2 IMPLANT
SUT VIC AB 1 CT1 8-18 (SUTURE) ×4
SUT VIC AB 2-0 CT1 18 (SUTURE) ×3 IMPLANT
SYR BULB IRRIG 60ML STRL (SYRINGE) ×2 IMPLANT
SYR CONTROL 10ML LL (SYRINGE) ×2 IMPLANT
TOOL TUNNELING 20 (MISCELLANEOUS) ×1 IMPLANT
TOWEL GREEN STERILE (TOWEL DISPOSABLE) ×2 IMPLANT
TOWEL GREEN STERILE FF (TOWEL DISPOSABLE) ×2 IMPLANT
WATER STERILE IRR 1000ML POUR (IV SOLUTION) ×1 IMPLANT
YANKAUER SUCT BULB TIP NO VENT (SUCTIONS) ×2 IMPLANT

## 2020-12-11 NOTE — Transfer of Care (Signed)
Immediate Anesthesia Transfer of Care Note  Patient: Kendra [name]  Procedure(s) Performed: SPINAL CORD STIMULATOR INSERTION (N/A Back)  Patient Location: PACU  Anesthesia Type:General  Level of Consciousness: awake and patient cooperative  Airway & Oxygen Therapy: Patient Spontanous Breathing and Patient connected to face mask oxygen  Post-op Assessment: Report given to RN and Post -op Vital signs reviewed and stable  Post vital signs: Reviewed and stable  Last Vitals:  Vitals Value Taken Time  BP 128/71 12/11/20 1135  Temp    Pulse 88 12/11/20 1137  Resp 15 12/11/20 1137  SpO2 91 % 12/11/20 1137  Vitals shown include unvalidated device data.  Last Pain:  Vitals:   12/11/20 0817  TempSrc:   PainSc: 5       Patients Stated Pain Goal: 3 (38/18/29 9371)  Complications: No complications documented.

## 2020-12-11 NOTE — Evaluation (Signed)
Physical Therapy Evaluation Patient Details Name: Kendra [name] MRN: [id number] DOB: 1945-10-07 Today's Date: 12/11/2020   History of Present Illness  75 yo female s/p spinal cord stimulator placement for chronic pain on 5/19. PMH includes RLS, preDM, HTN, HLD, depression, sleep apnea, GERD, anemia, peripheral edema.  Clinical Impression   Pt presents with [facility] strength, post-operative pain, and increased time and effort to mobilize, but is doing well and requires mostly supervision-only level of assist. Pt ambulated great hallway distance with initially HHA, transitioning to no assist. Pt completed flight of steps, demonstrating both ability to enter home and second floor of home. Pt and daughter educated on the importance of post-operative mobility, PT recommending 5x/day progressing to 1x/hour short-distance gait to promote circulation and maintain strength, pt and family understand. PT to sign off, please reconsult if needed.     Follow Up Recommendations Supervision for mobility/OOB    Equipment Recommendations  None recommended by PT    Recommendations for Other Services       Precautions / Restrictions Precautions Precautions: Fall Restrictions Weight Bearing Restrictions: No      Mobility  Bed Mobility Overal bed mobility: Needs Assistance Bed Mobility: Rolling;Sidelying to Sit Rolling: Supervision Sidelying to sit: Min assist       General bed mobility comments: min assist for trunk elevation via HHA, pt's daughter able to help with this at d/c.    Transfers Overall transfer level: Modified independent               General transfer comment: Mod I for rise and steady, pt reaching for environment to self-steady once standing.  Ambulation/Gait Ambulation/Gait assistance: Supervision;Min guard Gait Distance (Feet): 240 Feet Assistive device: None;1 person hand held assist Gait Pattern/deviations: Step-through pattern;Decreased stride length Gait velocity:  slightly decr post-op   General Gait Details: min guard with HHA initially, pt using PT hand to self-steady. Pt transitioning to no HHA and supervision level of support, cues for rest breaks as needed. DOE 2/4  Stairs Stairs: Yes Stairs assistance: Supervision Stair Management: One rail Left;Alternating pattern;Forwards Number of Stairs: 12 General stair comments: supervision for safety, pt assuming step-over-step gait pattern on steps  Wheelchair Mobility    Modified Rankin (Stroke Patients Only)       Balance Overall balance assessment: Mild deficits observed, not formally tested                                           Pertinent Vitals/Pain Pain Assessment: Faces Faces Pain Scale: Hurts even more Pain Location: back Pain Descriptors / Indicators: Sore;Discomfort;Grimacing Pain Intervention(s): Limited activity within patient's tolerance;Monitored during session;Repositioned    Home Living Family/patient expects to be discharged to:: Private residence Living Arrangements: Alone Available Help at Discharge: Family;Available 24 hours/day (sister and daughter to assist in providing 24/7 care initially) Type of Home: House Home Access: Stairs to enter   [employer] of Steps: 2 Home Layout: Two level;Able to live on main level with bedroom/bathroom Home Equipment: Other (comment) Additional Comments: walking stick    Prior Function Level of Independence: Independent         Comments: pt reports complete independence with ADLs, mobility, and reports 0 falls     Hand Dominance   Dominant Hand: Right    Extremity/Trunk Assessment   Upper Extremity Assessment Upper Extremity Assessment: Defer to OT evaluation    Lower Extremity  Assessment Lower Extremity Assessment: Overall WFL for tasks assessed    Cervical / Trunk Assessment Cervical / Trunk Assessment: Normal  Communication   Communication: No difficulties  Cognition  Arousal/Alertness: Awake/alert Behavior During Therapy: WFL for tasks assessed/performed Overall Cognitive Status: Within Functional Limits for tasks assessed                                        General Comments      Exercises     Assessment/Plan    PT Assessment Patent does not need any further PT services  PT Problem List Decreased mobility;Decreased activity tolerance       PT Treatment Interventions  (n/a)    PT Goals (Current goals can be found in the Care Plan section)  Acute Rehab PT Goals Patient Stated Goal: go home PT Goal Formulation: With patient Time For Goal Achievement: 12/25/20 Potential to Achieve Goals: Good    Frequency  (n/a)   Barriers to discharge        Co-evaluation               AM-PAC PT "6 Clicks" Mobility  Outcome Measure Help needed turning from your back to your side while in a flat bed without using bedrails?: None Help needed moving from lying on your back to sitting on the side of a flat bed without using bedrails?: A Little Help needed moving to and from a bed to a chair (including a wheelchair)?: A Little Help needed standing up from a chair using your arms (e.g., wheelchair or bedside chair)?: None Help needed to walk in hospital room?: None Help needed climbing 3-5 steps with a railing? : None 6 Click Score: 22    End of Session   Activity Tolerance: Patient tolerated treatment well;Patient limited by fatigue Patient left: in bed;with call bell/phone within reach;with family/visitor present Nurse Communication: Mobility status;Other (comment) (dyspnea on exertion, PT could not get an accurate pleth on pulse ox, RN aware) PT Visit Diagnosis: Difficulty in walking, not elsewhere classified (R26.2);Other abnormalities of gait and mobility (R26.89)    Time: 6546-5035 PT Time Calculation (min) (ACUTE ONLY): 21 min   Charges:   PT Evaluation $PT Eval Low Complexity: 1 Low          S, PT  DPT Acute Rehabilitation Services Pager [phone]  Office [phone]  Louis Matte 12/11/2020, 4:41 PM

## 2020-12-11 NOTE — Op Note (Signed)
OPERATIVE REPORT  DATE OF SURGERY: 12/11/2020  PATIENT NAME:  Kendra [name] MRN: [id number] DOB: 1946/05/29  PCP: Raina Mina., MD  PRE-OPERATIVE DIAGNOSIS: Chronic pain syndrome.  Status post successful trial spinal cord stimulator placement  POST-OPERATIVE DIAGNOSIS: Same  PROCEDURE:   Insertion of spinal cord stimulator  SURGEON:  Melina Schools, MD  First  ASSISTANT: April Green, RNFA  ANESTHESIA:   General  EBL: Minimal  Implant: Abbott spine.  Percutaneous spinal cord stimulator leads x2.  Proclaim XR battery  BRIEF HISTORY: Kendra [name] is a 75 y.o. female who has had chronic debilitating back pain for some time now.  Attempts at conservative management had failed to alleviate her symptoms and her quality of life is continued to deteriorate.  She recently had a spinal cord stimulator trial and noted significant improvement in her pain and quality of life.  As result of the successful trial she presents today for definitive implantation.  PROCEDURE DETAILS: Patient was brought into the operating room. After successful induction of general anesthesia and endotracheal intubation a Time Out was done. This confirmed all pertinent important data.  Patient was turned prone onto the Wilson frame and all bony prominences were well-padded.  The back was prepped and draped in a standard fashion.  The thoracic and battery site incision site were then infiltrated with quarter percent Marcaine with epinephrine and Exparel.  Fluoroscopic views were used to identify the T10 vertebral body.  I confirmed the proper position and level in both the AP and lateral planes.  Midline thoracic incision was made sharp dissection was carried out down to the deep fascia.  I then injected the paraspinal muscles with additional Exparel and Marcaine to aid in intraoperative hemostasis and postoperative pain control.  The deep fascia was incised and I stripped the paraspinal muscles to expose the T10, 11,  and 12 spinous processes.  The inferior portion of the T10 spinous process was removed with a double-action Leksell rongeur and a 2 mm Kerrison rongeur was used to perform a small laminotomy of the tendon.  I used a Penfield 4 to dissect through the central raphae of the ligamentum flavum.  This was then removed with a 2 mm Kerrison rongeur.  The epidural fat was now visualized.  At this point the percutaneous lead was obtained and gently passed under the T10 laminotomy.  Once the lead was at the T8-9 disc space I placed a second percutaneously.  Both leads were next to each other and properly positioned in both the AP and lateral planes.  Both leads were able to be inserted with no resistance.  With the leads properly positioned I then secured them to the T11 spinous process with an Ethibond suture.  They were then wrapped around the T11 spinous process to prevent migration.  The battery site was then created.  Incision was made sharp dissection was carried out approximately 2 and half centimeters deep and I created a pocket.  Using the submuscular passer I advanced the leads from the thoracic wound to the battery site incision.  I irrigated the battery site incision wound with copiously with normal saline and then secured the leads into the battery.  The knots were tightened and torqued according manufacture standards.  The excess lead was wrapped placed on the surface of the battery and the battery was placed into the pocket.  I then secured the battery to the deep fascia with interrupted #1 Vicryl sutures.  At this point both wounds were  copiously irrigated with normal saline.  The deep fascia of both wounds were closed with interrupted #1 Vicryl suture, then a running 0 Vicryl suture was used to close the the deep subcutaneous tissue and the thoracic wound followed by interrupted 2-0 Vicryl suture, and 3-0 Monocryl for the skin.  Once the wounds were closed final x-rays were taken which demonstrated  satisfactory position of the leads in both planes.  Dry dressings were applied and the patient was ultimately extubated transfer the PACU without incident.  The end of the case all needle sponge counts were correct.  Melina Schools, MD 12/11/2020 11:15 AM

## 2020-12-11 NOTE — Discharge Instructions (Signed)
 Spinal Cord Stimulator Care After This sheet gives you information about how to care for yourself after your procedure. Your health care provider may also give you more specific instructions. If you have problems or questions, contact your health care provider. What can I expect after the procedure? After the procedure, it is common to have:  Soreness or pain.  Some swelling in the area where the hardware was removed.  A small amount of blood or clear fluid coming from your incision. Follow these instructions at home: If you have a cast:  Do not stick anything inside the cast to scratch your skin. Doing that increases your risk of infection.  Check the skin around the cast every day. Tell your health care provider about any concerns.  You may put lotion on dry skin around the edges of the cast. Do not put lotion on the skin underneath the cast.  Keep the cast clean and dry. Do not take baths, swim, or use a hot tub until your health care provider approves. Ask your health care provider if you may take showers. You may only be allowed to take sponge baths. 1. Keep the bandage (dressing) dry until your health care provider says it can be removed. 2. Ok to shower in 5 days.    Incision care  1. Follow instructions from your health care provider about how to take care of your incision. Make sure you: ? Wash your hands with soap and water before you change your dressing. If soap and water are not available, use hand sanitizer. ? Change your dressing as told by your health care provider. ? Leave stitches (sutures), skin glue, or adhesive strips in place. These skin closures may need to stay in place for 2 weeks or longer. If adhesive strip edges start to loosen and curl up, you may trim the loose edges. Do not remove adhesive strips completely unless your health care provider tells you to do that. 2. Check your incision area every day for signs of infection. Check for: ? Redness. ? More  swelling or pain. ? More fluid or blood. ? Warmth. ? Pus or a bad smell. Managing pain, stiffness, and swelling  1. If directed, put ice on the affected area: ? Put ice in a plastic bag. ? Place a towel between your skin and the bag. ? Leave the ice on for 20 minutes, 2-3 times a day. 2. Move your fingers or toes often to avoid stiffness and to lessen swelling.  Driving  Do not drive or use heavy machinery while taking prescription pain medicine.  Do not drive for 24 hours if you were given a medicine to help you relax (sedative) during your procedure.  Ask your health care provider when it is safe to drive if you have a cast, splint, or boot on the affected limb. Activity  Ask your health care provider what activities are safe for you during recovery, and ask what activities you need to avoid.  Do not use the injured limb to support your body weight until your health care provider says that you can.  Do not play contact sports until your health care provider approves.  Do exercises as told by your health care provider.  Avoid sitting for a long time without moving. Get up and move around at least every few hours. This will help prevent blood clots. General instructions 1. Do not put pressure on any part of the cast or splint until it is fully hardened. This   may take several hours. 2. If you are taking prescription pain medicine, take actions to prevent or treat constipation. Your health care provider may recommend that you: ? Drink enough fluid to keep your urine pale yellow. ? Eat foods that are high in fiber, such as fresh fruits and vegetables, whole grains, and beans. ? Limit foods that are high in fat and processed sugars, such as fried or sweet foods. ? Take an over-the-counter or prescription medicine for constipation. 3. Do not use any products that contain nicotine or tobacco, such as cigarettes and e-cigarettes. These can delay bone healing after surgery. If you need  help quitting, ask your health care provider. 4. Take over-the-counter and prescription medicines only as told by your health care provider. 5. Keep all follow-up visits as told by your health care provider. This is important. Contact a health care provider if:  You have lasting pain.  You have redness around your incision.  You have more swelling or pain around your incision.  You have more fluid or blood coming from your incision.  Your incision feels warm to the touch.  You have pus or a bad smell coming from your incision.  You are unable to do exercises or physical activity as told by your health care provider. Get help right away if:  You have difficulty breathing.  You have chest pain.  You have severe pain.  You have a fever or chills.  You have numbness for more than 24 hours in the area where the hardware was removed. Summary  After the procedure, it is common to have some pain and swelling in the area where the hardware was removed.  Follow instructions from your health care provider about how to take care of your incision.  Return to your normal activities as told by your health care provider. Ask your health care provider what activities are safe for you. This information is not intended to replace advice given to you by your health care provider. Make sure you discuss any questions you have with your health care provider. 

## 2020-12-11 NOTE — Anesthesia Preprocedure Evaluation (Signed)
Anesthesia Evaluation  Patient identified by MRN, date of birth, ID band Patient awake    Reviewed: Allergy & Precautions, NPO status , Patient's Chart, lab work & pertinent test results  Airway Mallampati: II  TM Distance: >3 FB Neck ROM: Full    Dental  (+) Teeth Intact, Chipped,    Pulmonary sleep apnea , former smoker,    Pulmonary exam normal        Cardiovascular hypertension,  Rhythm:Regular Rate:Normal     Neuro/Psych  Headaches, Depression    GI/Hepatic Neg liver ROS, GERD  Medicated,  Endo/Other  Hypothyroidism   Renal/GU negative Renal ROS  negative genitourinary   Musculoskeletal  (+) Arthritis , Osteoarthritis,    Abdominal (+)  Abdomen: soft. Bowel sounds: normal.  Peds  Hematology  (+) anemia ,   Anesthesia Other Findings   Reproductive/Obstetrics                             Anesthesia Physical Anesthesia Plan  ASA: II  Anesthesia Plan: General   Post-op Pain Management:    Induction: Intravenous  PONV Risk Score and Plan: 3 and Ondansetron, Dexamethasone, Midazolam and Treatment may vary due to age or medical condition  Airway Management Planned: Mask and Oral ETT  Additional Equipment: None  Intra-op Plan:   Post-operative Plan: Extubation in OR  Informed Consent: I have reviewed the patients History and Physical, chart, labs and discussed the procedure including the risks, benefits and alternatives for the proposed anesthesia with the patient or authorized representative who has indicated his/her understanding and acceptance.     Dental advisory given  Plan Discussed with: CRNA  Anesthesia Plan Comments: (Lab Results      Component                Value               Date                      WBC                      10.3                12/08/2020                HGB                      11.3 (L)            12/08/2020                HCT                       37.6                12/08/2020                MCV                      77.2 (L)            12/08/2020                PLT                      299  12/08/2020           Lab Results      Component                Value               Date                      NA                       137                 12/08/2020                K                        3.8                 12/08/2020                CO2                      28                  12/08/2020                GLUCOSE                  91                  12/08/2020                BUN                      10                  12/08/2020                CREATININE               0.63                12/08/2020                CALCIUM                  9.2                 12/08/2020                GFRNONAA                 >60                 12/08/2020          )        Anesthesia Quick Evaluation

## 2020-12-11 NOTE — Brief Op Note (Signed)
12/11/2020  11:28 AM  PATIENT:  Kendra [name]  75 y.o. female  PRE-OPERATIVE DIAGNOSIS:  Chronic pain syndrome  POST-OPERATIVE DIAGNOSIS:  Chronic pain syndrome  PROCEDURE:  Procedure(s): SPINAL CORD STIMULATOR INSERTION (N/A)  SURGEON:  Surgeon(s) and Role:    Melina Schools, MD - Primary   ASSISTANTS: April Green, RNFA  ANESTHESIA:   general  EBL:  75 mL   BLOOD ADMINISTERED:none  DRAINS: none   LOCAL MEDICATIONS USED:  MARCAINE , and Exparel  SPECIMEN:  No Specimen  DISPOSITION OF SPECIMEN:  N/A  COUNTS:  YES  TOURNIQUET:  * No tourniquets in log *  DICTATION: .Dragon Dictation  PLAN OF CARE: Admit for overnight observation  PATIENT DISPOSITION:  PACU - hemodynamically stable.

## 2020-12-11 NOTE — Anesthesia Procedure Notes (Signed)
Procedure Name: Intubation Date/Time: 12/11/2020 8:55 AM Performed by: Renato Shin, CRNA Pre-anesthesia Checklist: Patient identified, Emergency Drugs available, Suction available and Patient being monitored Patient Re-evaluated:Patient Re-evaluated prior to induction Oxygen Delivery Method: Circle system utilized Preoxygenation: Pre-oxygenation with 100% oxygen Induction Type: IV induction Ventilation: Mask ventilation without difficulty and Oral airway inserted - appropriate to patient size Laryngoscope Size: Sabra Heck and 2 Grade View: Grade II Tube type: Oral Tube size: 7.0 mm Number of attempts: 1 Airway Equipment and Method: Stylet and Oral airway Placement Confirmation: ETT inserted through vocal cords under direct vision,  positive ETCO2 and breath sounds checked- equal and bilateral Secured at: 21 cm Tube secured with: Tape Dental Injury: Teeth and Oropharynx as per pre-operative assessment

## 2020-12-11 NOTE — Anesthesia Postprocedure Evaluation (Signed)
Anesthesia Post Note  Patient: Kendra [name]  Procedure(s) Performed: SPINAL CORD STIMULATOR INSERTION (N/A Back)     Patient location during evaluation: PACU Anesthesia Type: General Level of consciousness: awake and alert Pain management: pain level controlled Vital Signs Assessment: post-procedure vital signs reviewed and stable Respiratory status: spontaneous breathing, nonlabored ventilation, respiratory function stable and patient connected to nasal cannula oxygen Cardiovascular status: blood pressure returned to baseline and stable Postop Assessment: no apparent nausea or vomiting Anesthetic complications: no   No complications documented.  Last Vitals:  Vitals:   12/11/20 1205 12/11/20 1248  BP: (!) 119/59 134/77  Pulse: 64 69  Resp: 13 20  Temp: 36.8 C 36.6 C  SpO2: 92% 95%    Last Pain:  Vitals:   12/11/20 1340  TempSrc:   PainSc: 8                   P 

## 2020-12-11 NOTE — H&P (Signed)
Addendum H&P: There is been no change in the patient's clinical exam since her last office visit of 12/01/2020.  Plan on moving forward with a spinal cord stimulator placement for chronic pain syndrome.  All appropriate risks, benefits, alternatives to surgery were discussed and the patient is expressed an understanding of these risks as well as willingness to move forward with surgery

## 2020-12-12 DIAGNOSIS — Z79899 Other long term (current) drug therapy: Secondary | ICD-10-CM | POA: Diagnosis not present

## 2020-12-12 DIAGNOSIS — G894 Chronic pain syndrome: Secondary | ICD-10-CM | POA: Diagnosis not present

## 2020-12-12 DIAGNOSIS — I10 Essential (primary) hypertension: Secondary | ICD-10-CM | POA: Diagnosis not present

## 2020-12-12 DIAGNOSIS — E039 Hypothyroidism, unspecified: Secondary | ICD-10-CM | POA: Diagnosis not present

## 2020-12-12 DIAGNOSIS — R7303 Prediabetes: Secondary | ICD-10-CM | POA: Diagnosis not present

## 2020-12-12 DIAGNOSIS — R262 Difficulty in walking, not elsewhere classified: Secondary | ICD-10-CM | POA: Diagnosis not present

## 2020-12-12 MED ORDER — TAMSULOSIN HCL 0.4 MG PO CAPS
0.4000 mg | ORAL_CAPSULE | Freq: Once | ORAL | Status: AC
  Administered 2020-12-12: 0.4 mg via ORAL
  Filled 2020-12-12: qty 1

## 2020-12-12 NOTE — Progress Notes (Signed)
    Subjective: Procedure(s) (LRB): SPINAL CORD STIMULATOR INSERTION (N/A) 1 Day Post-Op  Patient reports pain as 4 on 0-10 scale.  Reports decreased leg pain reports incisional back pain   Positive void Negative bowel movement Positive flatus Negative chest pain or shortness of breath  Objective: Vital signs in last 24 hours: Temp:  [97.6 F (36.4 C)-98.7 F (37.1 C)] 98.7 F (37.1 C) (05/19 2359) Pulse Rate:  [62-89] 75 (05/19 2359) Resp:  [13-20] 20 (05/19 2359) BP: (114-151)/(59-101) 145/101 (05/19 2359) SpO2:  [92 %-98 %] 98 % (05/19 2359) Weight:  [131.3 kg] 131.3 kg (05/19 0756)  Intake/Output from previous day: 05/19 0701 - 05/20 0700 In: 1050 [I.V.:800; IV Piggyback:250] Out: 75 [Blood:75]  Labs: No results for input(s): WBC, RBC, HCT, PLT in the last 72 hours. No results for input(s): NA, K, CL, CO2, BUN, CREATININE, GLUCOSE, CALCIUM in the last 72 hours. No results for input(s): LABPT, INR in the last 72 hours.  Physical Exam: Neurologically intact ABD soft Intact pulses distally Incision: dressing C/D/I and no drainage Compartment soft Body mass index is 44.01 kg/m.   Assessment/Plan: Patient stable  xrays n/a Continue mobilization with physical therapy Continue care  Advance diet Up with therapy  Patient denies any incontinence of bowel and bladder but she does have a sensation that she is not emptying her bladder.  Bladder scan yesterday was positive for 275.  We will repeat this today. Encourage ambulation and mobilization.  Patient will work with physical therapy today. Plan on discharge to home provided her postvoid residual is improving and she is cleared by PT.  Melina Schools, MD [facility] [phone]

## 2020-12-12 NOTE — Evaluation (Signed)
Occupational Therapy Evaluation Patient Details Name: Kendra [name] MRN: [id number] DOB: 14-Feb-1946 Today's Date: 12/12/2020    History of Present Illness 75 yo female s/p spinal cord stimulator placement for chronic pain on 5/19. PMH includes RLS, preDM, HTN, HLD, depression, sleep apnea, GERD, anemia, peripheral edema.   Clinical Impression   Patient admitted for the above diagnosis and procedure.  PTA she lives alone and needed no assist with ADL, IADL, community mobility, or ambulation.  Post op pain is her biggest barrier.  Her pain is significantly worse compared to yesterday.  Of note: her stimulator has not been turned on as of this evaluation.  Currently, she is needing Min A for lower body ADL, but as pain lessen it is expected she will return to her baseline.  She is expected to go home today, recommend follow up as prescribed by MD.  No further needs for OT in the acute setting.  Her daughter will be staying with her to assist as needed.     Follow Up Recommendations  No OT follow up    Equipment Recommendations  None recommended by OT    Recommendations for Other Services       Precautions / Restrictions Precautions Precautions: Fall Restrictions Weight Bearing Restrictions: No      Mobility Bed Mobility   Bed Mobility: Sidelying to Sit;Sit to Sidelying   Sidelying to sit: Min assist     Sit to sidelying: Min assist   Patient Response: Restless  Transfers Overall transfer level: Needs assistance   Transfers: Sit to/from Stand;Stand Pivot Transfers Sit to Stand: Min guard Stand pivot transfers: Min guard            Balance Overall balance assessment: Mild deficits observed, not formally tested                                         ADL either performed or assessed with clinical judgement   ADL                                         General ADL Comments: Patient needing Min A for LB dressing due to pain.  She  should return to baseline as pain declines.     Vision Patient Visual Report: No change from baseline       Perception     Praxis      Pertinent Vitals/Pain Faces Pain Scale: Hurts whole lot Pain Location: back Pain Descriptors / Indicators: Sore;Discomfort;Grimacing Pain Intervention(s): Monitored during session;Premedicated before session     Hand Dominance Right   Extremity/Trunk Assessment Upper Extremity Assessment Upper Extremity Assessment: Overall WFL for tasks assessed   Lower Extremity Assessment Lower Extremity Assessment: Defer to PT evaluation   Cervical / Trunk Assessment Cervical / Trunk Assessment: Normal   Communication Communication Communication: No difficulties   Cognition Arousal/Alertness: Awake/alert Behavior During Therapy: WFL for tasks assessed/performed Overall Cognitive Status: Within Functional Limits for tasks assessed                                                      Home Living Family/patient expects to be discharged  to:: Private residence Living Arrangements: Alone Available Help at Discharge: Family;Available 24 hours/day Type of Home: House Home Access: Stairs to enter [employer] of Steps: 2   Home Layout: Two level;Able to live on main level with bedroom/bathroom Alternate Level Stairs-Number of Steps: flight, walk in shower upstairs and tub shower downstairs Alternate Level Stairs-Rails: Left Bathroom Shower/Tub: Walk-in shower;Tub/shower Editor, commissioning: Standard     Home Equipment: Financial controller: Reacher;Long-handled sponge Additional Comments: walking stick      Prior Functioning/Environment Level of Independence: Independent        Comments: pt reports complete independence with ADLs, mobility, and reports 0 falls        OT Problem List: Pain;Impaired balance (sitting and/or standing)      OT Treatment/Interventions:      OT  Goals(Current goals can be found in the care plan section) Acute Rehab OT Goals Patient Stated Goal: I need this pain to let up OT Goal Formulation: With patient Time For Goal Achievement: 12/12/20 Potential to Achieve Goals: Good  OT Frequency:     Barriers to D/C:  none noted          Co-evaluation              AM-PAC OT "6 Clicks" Daily Activity     Outcome Measure Help from another person eating meals?: None Help from another person taking care of personal grooming?: None Help from another person toileting, which includes using toliet, bedpan, or urinal?: None Help from another person bathing (including washing, rinsing, drying)?: A Little Help from another person to put on and taking off regular upper body clothing?: None Help from another person to put on and taking off regular lower body clothing?: A Little 6 Click Score: 22   End of Session Nurse Communication: Mobility status  Activity Tolerance: Patient tolerated treatment well Patient left: in chair;with call bell/phone within reach  OT Visit Diagnosis: Unsteadiness on feet (R26.81);Pain                Time: 1030-1048 OT Time Calculation (min): 18 min Charges:  OT General Charges $OT Visit: 1 Visit OT Evaluation $OT Eval Moderate Complexity: 1 Mod  12/12/2020  Rich, OTR/L  Acute Rehabilitation Services  Office:  [place] 12/12/2020, 10:54 AM

## 2020-12-12 NOTE — Progress Notes (Addendum)
Patient c/o not emptying bladder this morning. Bladder scanned twice and results showed less than 400 post void. PRN lasix given. MD notified and Flomax ordered with good output. See chart for output record. Patient states she now feels she is emptying her bladder and ready to go home.Discharged from room 3C11 at this time. Alert and in stable condition. IV site d/c'd and instructions read to patient with understanding verbalized and all questions answered. Left unit via wheelchair with all belongings at side.

## 2020-12-13 DIAGNOSIS — G8918 Other acute postprocedural pain: Secondary | ICD-10-CM | POA: Diagnosis not present

## 2020-12-13 DIAGNOSIS — M549 Dorsalgia, unspecified: Secondary | ICD-10-CM | POA: Diagnosis not present

## 2020-12-13 DIAGNOSIS — I700 Atherosclerosis of aorta: Secondary | ICD-10-CM | POA: Diagnosis not present

## 2020-12-13 DIAGNOSIS — K449 Diaphragmatic hernia without obstruction or gangrene: Secondary | ICD-10-CM | POA: Diagnosis not present

## 2020-12-13 DIAGNOSIS — R109 Unspecified abdominal pain: Secondary | ICD-10-CM | POA: Diagnosis not present

## 2020-12-14 DIAGNOSIS — I700 Atherosclerosis of aorta: Secondary | ICD-10-CM | POA: Diagnosis not present

## 2020-12-14 DIAGNOSIS — G8918 Other acute postprocedural pain: Secondary | ICD-10-CM | POA: Diagnosis not present

## 2020-12-14 DIAGNOSIS — K449 Diaphragmatic hernia without obstruction or gangrene: Secondary | ICD-10-CM | POA: Diagnosis not present

## 2020-12-14 DIAGNOSIS — R109 Unspecified abdominal pain: Secondary | ICD-10-CM | POA: Diagnosis not present

## 2020-12-15 NOTE — Discharge Summary (Signed)
Patient ID: Kendra [name] MRN: [id number] DOB/AGE: 73-19-1947 73 y.o.  Admit date: 12/11/2020 Discharge date: 12/15/2020  Admission Diagnoses:  Active Problems:   Chronic pain   Discharge Diagnoses:  Active Problems:   Chronic pain  status post Procedure(s): SPINAL CORD STIMULATOR INSERTION  Past Medical History:  Diagnosis Date  . Anemia   . Degenerative disc disease, lumbar   . Depression   . GERD (gastroesophageal reflux disease)   . Hypertension   . Hypothyroidism   . Mixed hyperlipidemia   . Morbid obesity ([place])   . Peripheral edema   . Pre-diabetes   . Restless leg syndrome   . Sleep apnea    CPAP  . Spinal headache    from epidural during childbirth  . Spinal stenosis at L4-L5 level     Surgeries: Procedure(s): SPINAL CORD STIMULATOR INSERTION on 12/11/2020   Consultants:   Discharged Condition: Improved  Hospital Course: Kendra [name] is an 75 y.o. female who was admitted 12/11/2020 for operative treatment of Chronic pain syndrome.  Status post successful trial spinal cord stimulator placement. Patient failed conservative treatments (please see the history and physical for the specifics) and had severe unremitting pain that affects sleep, daily activities and work/hobbies. After pre-op clearance, the patient was taken to the operating room on 12/11/2020 and underwent  Procedure(s): [place].    Patient was given perioperative antibiotics:  Anti-infectives (From admission, onward)   Start     Dose/Rate Route Frequency Ordered Stop   12/11/20 1330  ceFAZolin (ANCEF) IVPB 1 g/50 mL premix        1 g 100 mL/hr over 30 Minutes Intravenous Every 8 hours 12/11/20 1237 12/12/20 1316   12/11/20 0752  ceFAZolin (ANCEF) IVPB 2g/100 mL premix        2 g 200 mL/hr over 30 Minutes Intravenous 30 min pre-op 12/11/20 0752 12/11/20 0929       Patient was given sequential compression devices and early ambulation to prevent DVT.   Patient  benefited maximally from hospital stay and there were no complications. At the time of discharge, the patient was urinating/moving their bowels without difficulty, tolerating a regular diet, pain is controlled with oral pain medications and they have been cleared by PT/OT.   Recent vital signs: No data found.   Recent laboratory studies: No results for input(s): WBC, HGB, HCT, PLT, NA, K, CL, CO2, BUN, CREATININE, GLUCOSE, INR, CALCIUM in the last 72 hours.  Invalid input(s): PT, 2   Discharge Medications:   Allergies as of 12/12/2020      Reactions   Cephalexin Rash   Felodipine Rash      Medication List    STOP taking these medications   valACYclovir 1000 MG tablet Commonly known as: VALTREX     TAKE these medications   ALPRAZolam 0.5 MG tablet Commonly known as: XANAX Take 0.5 mg by mouth at bedtime as needed for anxiety or sleep.   FLUoxetine 20 MG capsule Commonly known as: PROZAC Take 20 mg by mouth daily.   furosemide 40 MG tablet Commonly known as: LASIX Take 40 mg by mouth daily as needed for edema.   glycopyrrolate 1 MG tablet Commonly known as: ROBINUL Take 1 mg by mouth daily.   levothyroxine 88 MCG tablet Commonly known as: SYNTHROID Take 88 mcg by mouth daily before breakfast.   methocarbamol 500 MG tablet Commonly known as: Robaxin Take 1 tablet (500 mg total) by mouth every 8 (eight) hours as  needed for up to 5 days for muscle spasms.   omeprazole 20 MG capsule Commonly known as: PRILOSEC Take 20 mg by mouth daily.   ondansetron 4 MG tablet Commonly known as: Zofran Take 1 tablet (4 mg total) by mouth every 8 (eight) hours as needed for nausea or vomiting.   oxyCODONE-acetaminophen 10-325 MG tablet Commonly known as: Percocet Take 1 tablet by mouth every 6 (six) hours as needed for up to 5 days for pain.   pramipexole 1 MG tablet Commonly known as: MIRAPEX Take 2 mg by mouth 3 (three) times daily.   SUMAtriptan 100 MG tablet Commonly  known as: IMITREX Take 100 mg by mouth every 2 (two) hours as needed for migraine. May repeat in 2 hours if headache persists or recurs.       Diagnostic Studies: DG Chest 2 View  Result Date: 12/09/2020 CLINICAL DATA:  75 year old female under preoperative evaluation prior to spinal stimulator placement. EXAM: CHEST - 2 VIEW COMPARISON:  Chest x-ray 06/03/2018. FINDINGS: Lung volumes are normal. No consolidative airspace disease. No pleural effusions. No pneumothorax. No pulmonary nodule or mass noted. Large hiatal hernia. Pulmonary vasculature and the cardiomediastinal silhouette are otherwise within normal limits. IMPRESSION: 1. No radiographic evidence of acute cardiopulmonary disease. 2. Large hiatal hernia. Electronically Signed   By: Vinnie Langton M.D.   On: 12/09/2020 09:32   DG THORACOLUMABAR SPINE  Result Date: 12/11/2020 CLINICAL DATA:  Surgery, elective. Additional history provided: Spinal cord stimulator insertion for chronic pain syndrome. Provided fluoroscopy time 50 seconds (51.39 mGy). EXAM: THORACOLUMBAR SPINE 1V COMPARISON:  Chest radiograph 12/08/2020. Radiographs of the thoracic spine 10/08/2015. FINDINGS: AP and lateral view intraprocedural fluoroscopic images of the thoracic spine are submitted. On the provided images, two spinal cord stimulator leads project within the dorsal aspect of the thoracic spinal canal. The exact position of lead termination is difficult to ascertain given the provided field of view. Correlate with the procedural history. IMPRESSION: Two intraprocedural fluoroscopic images from spinal cord stimulator lead placement, as described. Electronically Signed   By: Kellie Simmering DO   On: 12/11/2020 11:39   DG C-Arm 1-60 Min  Result Date: 12/11/2020 CLINICAL DATA:  Surgery, elective. Additional history provided: Spinal cord stimulator insertion for chronic pain syndrome. Provided fluoroscopy time 50 seconds (51.39 mGy). EXAM: THORACOLUMBAR SPINE 1V  COMPARISON:  Chest radiograph 12/08/2020. Radiographs of the thoracic spine 10/08/2015. FINDINGS: AP and lateral view intraprocedural fluoroscopic images of the thoracic spine are submitted. On the provided images, two spinal cord stimulator leads project within the dorsal aspect of the thoracic spinal canal. The exact position of lead termination is difficult to ascertain given the provided field of view. Correlate with the procedural history. IMPRESSION: Two intraprocedural fluoroscopic images from spinal cord stimulator lead placement, as described. Electronically Signed   By: Kellie Simmering DO   On: 12/11/2020 11:39    Discharge Instructions    Incentive spirometry RT   Complete by: As directed        Follow-up Information    Melina Schools, MD In 2 weeks.   Specialty: Orthopedic Surgery Why: If symptoms worsen, For suture removal, For wound re-check Contact information: [address] [place] [address] [phone]               Discharge Plan:  discharge to home  Disposition: stable    Signed: Charlyne Petrin for [facility] PA-C [facility] [phone] 12/15/2020, 8:38 AM

## 2020-12-17 ENCOUNTER — Encounter (HOSPITAL_COMMUNITY): Payer: Self-pay | Admitting: Orthopedic Surgery

## 2020-12-26 DIAGNOSIS — M5136 Other intervertebral disc degeneration, lumbar region: Secondary | ICD-10-CM | POA: Diagnosis not present

## 2020-12-29 DIAGNOSIS — G2581 Restless legs syndrome: Secondary | ICD-10-CM | POA: Diagnosis not present

## 2020-12-29 DIAGNOSIS — R609 Edema, unspecified: Secondary | ICD-10-CM | POA: Diagnosis not present

## 2020-12-29 DIAGNOSIS — R7303 Prediabetes: Secondary | ICD-10-CM | POA: Diagnosis not present

## 2020-12-29 DIAGNOSIS — Z0000 Encounter for general adult medical examination without abnormal findings: Secondary | ICD-10-CM | POA: Diagnosis not present

## 2020-12-29 DIAGNOSIS — R5383 Other fatigue: Secondary | ICD-10-CM | POA: Diagnosis not present

## 2020-12-29 DIAGNOSIS — E039 Hypothyroidism, unspecified: Secondary | ICD-10-CM | POA: Diagnosis not present

## 2020-12-29 DIAGNOSIS — M4310 Spondylolisthesis, site unspecified: Secondary | ICD-10-CM | POA: Diagnosis not present

## 2020-12-29 DIAGNOSIS — R61 Generalized hyperhidrosis: Secondary | ICD-10-CM | POA: Diagnosis not present

## 2020-12-29 DIAGNOSIS — K219 Gastro-esophageal reflux disease without esophagitis: Secondary | ICD-10-CM | POA: Diagnosis not present

## 2020-12-29 DIAGNOSIS — E782 Mixed hyperlipidemia: Secondary | ICD-10-CM | POA: Diagnosis not present

## 2020-12-29 DIAGNOSIS — R5381 Other malaise: Secondary | ICD-10-CM | POA: Diagnosis not present

## 2020-12-29 DIAGNOSIS — N952 Postmenopausal atrophic vaginitis: Secondary | ICD-10-CM | POA: Diagnosis not present

## 2020-12-29 DIAGNOSIS — M5136 Other intervertebral disc degeneration, lumbar region: Secondary | ICD-10-CM | POA: Diagnosis not present

## 2020-12-29 DIAGNOSIS — I10 Essential (primary) hypertension: Secondary | ICD-10-CM | POA: Diagnosis not present

## 2020-12-29 DIAGNOSIS — M48061 Spinal stenosis, lumbar region without neurogenic claudication: Secondary | ICD-10-CM | POA: Diagnosis not present

## 2020-12-29 DIAGNOSIS — D509 Iron deficiency anemia, unspecified: Secondary | ICD-10-CM

## 2020-12-29 DIAGNOSIS — Z23 Encounter for immunization: Secondary | ICD-10-CM | POA: Diagnosis not present

## 2020-12-29 HISTORY — DX: Iron deficiency anemia, unspecified: D50.9

## 2020-12-30 DIAGNOSIS — E611 Iron deficiency: Secondary | ICD-10-CM

## 2020-12-30 HISTORY — DX: Iron deficiency: E61.1

## 2021-01-01 DIAGNOSIS — M25561 Pain in right knee: Secondary | ICD-10-CM | POA: Diagnosis not present

## 2021-01-01 DIAGNOSIS — M1711 Unilateral primary osteoarthritis, right knee: Secondary | ICD-10-CM | POA: Diagnosis not present

## 2021-01-08 DIAGNOSIS — R195 Other fecal abnormalities: Secondary | ICD-10-CM | POA: Diagnosis not present

## 2021-01-08 DIAGNOSIS — D500 Iron deficiency anemia secondary to blood loss (chronic): Secondary | ICD-10-CM | POA: Diagnosis not present

## 2021-01-13 DIAGNOSIS — H26492 Other secondary cataract, left eye: Secondary | ICD-10-CM | POA: Diagnosis not present

## 2021-01-13 DIAGNOSIS — Z9842 Cataract extraction status, left eye: Secondary | ICD-10-CM | POA: Diagnosis not present

## 2021-01-13 DIAGNOSIS — Z961 Presence of intraocular lens: Secondary | ICD-10-CM | POA: Diagnosis not present

## 2021-01-13 DIAGNOSIS — H52222 Regular astigmatism, left eye: Secondary | ICD-10-CM | POA: Diagnosis not present

## 2021-02-10 DIAGNOSIS — D171 Benign lipomatous neoplasm of skin and subcutaneous tissue of trunk: Secondary | ICD-10-CM | POA: Diagnosis not present

## 2021-02-16 DIAGNOSIS — D171 Benign lipomatous neoplasm of skin and subcutaneous tissue of trunk: Secondary | ICD-10-CM | POA: Diagnosis not present

## 2021-03-04 DIAGNOSIS — E039 Hypothyroidism, unspecified: Secondary | ICD-10-CM | POA: Diagnosis not present

## 2021-03-04 DIAGNOSIS — I10 Essential (primary) hypertension: Secondary | ICD-10-CM | POA: Diagnosis not present

## 2021-03-04 DIAGNOSIS — U071 COVID-19: Secondary | ICD-10-CM | POA: Diagnosis not present

## 2021-03-13 DIAGNOSIS — R059 Cough, unspecified: Secondary | ICD-10-CM | POA: Diagnosis not present

## 2021-03-13 DIAGNOSIS — R0602 Shortness of breath: Secondary | ICD-10-CM | POA: Diagnosis not present

## 2021-03-13 DIAGNOSIS — I517 Cardiomegaly: Secondary | ICD-10-CM | POA: Diagnosis not present

## 2021-03-13 DIAGNOSIS — U071 COVID-19: Secondary | ICD-10-CM | POA: Diagnosis not present

## 2021-03-13 DIAGNOSIS — I700 Atherosclerosis of aorta: Secondary | ICD-10-CM | POA: Diagnosis not present

## 2021-03-13 DIAGNOSIS — J9811 Atelectasis: Secondary | ICD-10-CM | POA: Diagnosis not present

## 2021-03-26 DIAGNOSIS — M5136 Other intervertebral disc degeneration, lumbar region: Secondary | ICD-10-CM | POA: Diagnosis not present

## 2021-03-26 DIAGNOSIS — Z5181 Encounter for therapeutic drug level monitoring: Secondary | ICD-10-CM | POA: Diagnosis not present

## 2021-03-26 DIAGNOSIS — M48061 Spinal stenosis, lumbar region without neurogenic claudication: Secondary | ICD-10-CM | POA: Diagnosis not present

## 2021-03-26 DIAGNOSIS — Z79899 Other long term (current) drug therapy: Secondary | ICD-10-CM | POA: Diagnosis not present

## 2021-04-17 DIAGNOSIS — K5730 Diverticulosis of large intestine without perforation or abscess without bleeding: Secondary | ICD-10-CM | POA: Diagnosis not present

## 2021-04-17 DIAGNOSIS — R195 Other fecal abnormalities: Secondary | ICD-10-CM | POA: Diagnosis not present

## 2021-04-17 DIAGNOSIS — D123 Benign neoplasm of transverse colon: Secondary | ICD-10-CM | POA: Diagnosis not present

## 2021-04-17 DIAGNOSIS — D500 Iron deficiency anemia secondary to blood loss (chronic): Secondary | ICD-10-CM | POA: Diagnosis not present

## 2021-04-17 DIAGNOSIS — K635 Polyp of colon: Secondary | ICD-10-CM | POA: Diagnosis not present

## 2021-05-01 DIAGNOSIS — H47233 Glaucomatous optic atrophy, bilateral: Secondary | ICD-10-CM | POA: Diagnosis not present

## 2021-05-01 DIAGNOSIS — H40013 Open angle with borderline findings, low risk, bilateral: Secondary | ICD-10-CM | POA: Diagnosis not present

## 2021-05-04 ENCOUNTER — Other Ambulatory Visit (HOSPITAL_BASED_OUTPATIENT_CLINIC_OR_DEPARTMENT_OTHER): Payer: Self-pay

## 2021-05-05 DIAGNOSIS — M1711 Unilateral primary osteoarthritis, right knee: Secondary | ICD-10-CM | POA: Diagnosis not present

## 2021-05-12 DIAGNOSIS — M25562 Pain in left knee: Secondary | ICD-10-CM | POA: Diagnosis not present

## 2021-07-02 DIAGNOSIS — K219 Gastro-esophageal reflux disease without esophagitis: Secondary | ICD-10-CM | POA: Diagnosis not present

## 2021-07-02 DIAGNOSIS — R61 Generalized hyperhidrosis: Secondary | ICD-10-CM | POA: Diagnosis not present

## 2021-07-02 DIAGNOSIS — I10 Essential (primary) hypertension: Secondary | ICD-10-CM | POA: Diagnosis not present

## 2021-07-02 DIAGNOSIS — E782 Mixed hyperlipidemia: Secondary | ICD-10-CM | POA: Diagnosis not present

## 2021-07-02 DIAGNOSIS — E039 Hypothyroidism, unspecified: Secondary | ICD-10-CM | POA: Diagnosis not present

## 2021-07-02 DIAGNOSIS — D509 Iron deficiency anemia, unspecified: Secondary | ICD-10-CM | POA: Diagnosis not present

## 2021-07-02 DIAGNOSIS — M5136 Other intervertebral disc degeneration, lumbar region: Secondary | ICD-10-CM | POA: Diagnosis not present

## 2021-07-02 DIAGNOSIS — Z79899 Other long term (current) drug therapy: Secondary | ICD-10-CM | POA: Diagnosis not present

## 2021-07-02 DIAGNOSIS — Z6841 Body Mass Index (BMI) 40.0 and over, adult: Secondary | ICD-10-CM | POA: Diagnosis not present

## 2021-07-02 DIAGNOSIS — R5383 Other fatigue: Secondary | ICD-10-CM | POA: Diagnosis not present

## 2021-07-02 DIAGNOSIS — M4310 Spondylolisthesis, site unspecified: Secondary | ICD-10-CM | POA: Diagnosis not present

## 2021-07-02 DIAGNOSIS — Z23 Encounter for immunization: Secondary | ICD-10-CM | POA: Diagnosis not present

## 2021-07-02 DIAGNOSIS — F331 Major depressive disorder, recurrent, moderate: Secondary | ICD-10-CM | POA: Diagnosis not present

## 2021-07-02 DIAGNOSIS — R5381 Other malaise: Secondary | ICD-10-CM | POA: Diagnosis not present

## 2021-07-14 DIAGNOSIS — R0600 Dyspnea, unspecified: Secondary | ICD-10-CM | POA: Diagnosis not present

## 2021-07-14 DIAGNOSIS — R0609 Other forms of dyspnea: Secondary | ICD-10-CM | POA: Diagnosis not present

## 2021-07-14 DIAGNOSIS — I517 Cardiomegaly: Secondary | ICD-10-CM | POA: Diagnosis not present

## 2021-07-15 DIAGNOSIS — I38 Endocarditis, valve unspecified: Secondary | ICD-10-CM | POA: Insufficient documentation

## 2021-07-15 HISTORY — DX: Endocarditis, valve unspecified: I38

## 2021-07-16 DIAGNOSIS — M48061 Spinal stenosis, lumbar region without neurogenic claudication: Secondary | ICD-10-CM | POA: Insufficient documentation

## 2021-07-16 DIAGNOSIS — D500 Iron deficiency anemia secondary to blood loss (chronic): Secondary | ICD-10-CM | POA: Diagnosis not present

## 2021-07-16 DIAGNOSIS — R600 Localized edema: Secondary | ICD-10-CM | POA: Insufficient documentation

## 2021-07-16 DIAGNOSIS — I10 Essential (primary) hypertension: Secondary | ICD-10-CM | POA: Insufficient documentation

## 2021-07-16 DIAGNOSIS — G971 Other reaction to spinal and lumbar puncture: Secondary | ICD-10-CM | POA: Insufficient documentation

## 2021-07-16 DIAGNOSIS — R7303 Prediabetes: Secondary | ICD-10-CM | POA: Insufficient documentation

## 2021-07-16 DIAGNOSIS — F32A Depression, unspecified: Secondary | ICD-10-CM | POA: Insufficient documentation

## 2021-07-16 DIAGNOSIS — G4730 Sleep apnea, unspecified: Secondary | ICD-10-CM | POA: Insufficient documentation

## 2021-07-16 DIAGNOSIS — R609 Edema, unspecified: Secondary | ICD-10-CM | POA: Insufficient documentation

## 2021-07-16 DIAGNOSIS — G2581 Restless legs syndrome: Secondary | ICD-10-CM | POA: Insufficient documentation

## 2021-07-16 DIAGNOSIS — M5136 Other intervertebral disc degeneration, lumbar region: Secondary | ICD-10-CM | POA: Insufficient documentation

## 2021-07-16 DIAGNOSIS — D649 Anemia, unspecified: Secondary | ICD-10-CM | POA: Insufficient documentation

## 2021-07-16 DIAGNOSIS — Z1212 Encounter for screening for malignant neoplasm of rectum: Secondary | ICD-10-CM | POA: Diagnosis not present

## 2021-07-16 DIAGNOSIS — E782 Mixed hyperlipidemia: Secondary | ICD-10-CM | POA: Insufficient documentation

## 2021-07-16 DIAGNOSIS — R195 Other fecal abnormalities: Secondary | ICD-10-CM | POA: Diagnosis not present

## 2021-08-03 ENCOUNTER — Other Ambulatory Visit: Payer: Self-pay

## 2021-08-03 ENCOUNTER — Ambulatory Visit (INDEPENDENT_AMBULATORY_CARE_PROVIDER_SITE_OTHER): Payer: Medicare Other | Admitting: Cardiology

## 2021-08-03 ENCOUNTER — Encounter: Payer: Self-pay | Admitting: Cardiology

## 2021-08-03 DIAGNOSIS — G4730 Sleep apnea, unspecified: Secondary | ICD-10-CM

## 2021-08-03 DIAGNOSIS — Z6841 Body Mass Index (BMI) 40.0 and over, adult: Secondary | ICD-10-CM

## 2021-08-03 DIAGNOSIS — R0609 Other forms of dyspnea: Secondary | ICD-10-CM | POA: Diagnosis not present

## 2021-08-03 DIAGNOSIS — R079 Chest pain, unspecified: Secondary | ICD-10-CM

## 2021-08-03 DIAGNOSIS — Z8616 Personal history of COVID-19: Secondary | ICD-10-CM

## 2021-08-03 HISTORY — DX: Other forms of dyspnea: R06.09

## 2021-08-03 HISTORY — DX: Personal history of COVID-19: Z86.16

## 2021-08-03 LAB — BASIC METABOLIC PANEL
BUN/Creatinine Ratio: 22 (ref 12–28)
BUN: 13 mg/dL (ref 8–27)
CO2: 27 mmol/L (ref 20–29)
Calcium: 9.2 mg/dL (ref 8.7–10.3)
Chloride: 100 mmol/L (ref 96–106)
Creatinine, Ser: 0.58 mg/dL (ref 0.57–1.00)
Glucose: 82 mg/dL (ref 70–99)
Potassium: 4.3 mmol/L (ref 3.5–5.2)
Sodium: 138 mmol/L (ref 134–144)
eGFR: 95 mL/min/{1.73_m2} (ref >59)

## 2021-08-03 LAB — D-DIMER, QUANTITATIVE: D-DIMER: 0.29 mg/L FEU (ref 0.00–0.49)

## 2021-08-03 NOTE — Progress Notes (Signed)
Cardiology Office Note:    Date:  08/03/2021   ID:  JAQUITTA DUPRIEST, DOB 22-Jul-1946, MRN [id number]  PCP:  Mayer Camel, NP  Cardiologist:  Jenean Lindau, MD   Referring MD: Bess Harvest*    ASSESSMENT:    1. Morbid obesity with BMI of 40.0-44.9, adult ([place])   2. Dyspnea on exertion   3. History of COVID-19   4. Sleep apnea, unspecified type    PLAN:    In order of problems listed above:  Primary prevention stressed with the patient.  Importance of this with diet medication stressed and she vocalized understanding. Dyspnea on exertion: Could be multifactorial.  This could be her COVID-19 infection sequelae however in view of these findings I would like to evaluate her with a D-dimer to see if there is any history of pulmonary thromboembolism.  If this is negative we will do a Lexiscan sestamibi to assess for any objective evidence of coronary artery disease.  She is agreeable for this. Morbid obesity: Weight reduction stressed diet was emphasized.  Lifestyle modification urged.  Risks of obesity explained and she promises to do better. History of mixed dyslipidemia: Diet emphasized.  Lipids followed by primary care. Sleep apnea: Sleep health issues were discussed Patient will be seen in follow-up appointment in 2 months or earlier if the patient has any concerns.  She knows to go to the nearest emergency room for any concerning symptoms.   Medication Adjustments/Labs and Tests Ordered: Current medicines are reviewed at length with the patient today.  Concerns regarding medicines are outlined above.  No orders of the defined types were placed in this encounter.  No orders of the defined types were placed in this encounter.    History of Present Illness:    LYNDALL BELLOT is a 76 y.o. female who is being seen today for the evaluation of dyspnea on exertion at the request of Brown-Patram, Marland Kitchen*.  Patient is a pleasant 77 year old female.  She has  past medical history of morbid obesity.  She leads a sedentary lifestyle because of orthopedic issues involving her lower back.  She mentions to me that after the COVID-19 infection she has been having dyspnea on exertion since October.  No chest pain orthopnea or PND.  She does not exercise regularly, not sexually active and therefore dyspnea on exertion cannot be completely clarified.  She tells me with activities of daily living she gets short of breath.  At the time of my evaluation, the patient is alert awake oriented and in no distress.  Past Medical History:  Diagnosis Date   Anemia    Atrophic vaginitis 12/26/2019   Formatting of this note might be different from the original. Follows along with urology uses bioidentical hormone   Benign essential hypertension 10/08/2015   Chest pain in adult 07/02/2015   Formatting of this note might be different from the original. 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   Hypertension    Hypothyroidism    Insomnia 11/27/2013   Iron deficiency 12/30/2020   Iron deficiency anemia 12/29/2020   Lumbar spondylosis 05/21/2020   Malaise and fatigue 05/25/2018   Mixed hyperlipidemia    Moderate recurrent major depression ([place])  06/26/2019   Morbid obesity ([place])    Morbid obesity with BMI of 40.0-44.9, adult ([employer]) 11/17/2016   Peripheral edema    Popliteal pain 07/23/2019   Pre-diabetes    Primary hypothyroidism 10/08/2015   Restless leg syndrome    Sleep apnea    CPAP   Spinal headache    from epidural during childbirth   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 L3/4 7.2 mm with L4/5 mod to severe spinal stenosis,  L5/S1 with slight disc on the right Formatting of this note might be different from the original. Overview:  L3/4 7.2 mm with L4/5 mod to severe spinal stenosis, L5/S1 with slight disc on the right    Past Surgical History:  Procedure Laterality Date   ABDOMINAL HYSTERECTOMY     CESAREAN SECTION     x2   SPINAL CORD STIMULATOR INSERTION N/A 12/11/2020   Procedure: SPINAL CORD STIMULATOR INSERTION;  Surgeon: Melina Schools, MD;  Location: [place];  Service: Orthopedics;  Laterality: N/A;    Current Medications: Current Meds  Medication Sig   ALPRAZolam (XANAX) 0.5 MG tablet Take 0.5 mg by mouth at bedtime as needed for anxiety or sleep.   ferrous sulfate 325 (65 FE) MG EC tablet Take 325 mg by mouth daily with breakfast.   FLUoxetine (PROZAC) 20 MG capsule Take 20 mg by mouth daily.   furosemide (LASIX) 40 MG tablet Take 40 mg by mouth daily as needed for edema.   glycopyrrolate (ROBINUL) 1 MG tablet Take 1 mg by mouth daily.   HYDROcodone-acetaminophen (NORCO/VICODIN) 5-325 MG tablet Take 0.5 tablets by mouth every 6 (six) hours as needed for moderate pain. Take 1/2 tab as needed for back pain   levothyroxine (SYNTHROID) 88 MCG tablet Take 88 mcg by mouth daily before breakfast.   omeprazole (PRILOSEC) 20 MG capsule Take 20 mg by mouth daily.   pramipexole (MIRAPEX) 1 MG tablet Take 2 mg by mouth 3 (three) times daily.   SUMAtriptan (IMITREX) 100 MG tablet Take 100 mg by mouth every 2 (two) hours as needed for migraine. May repeat in 2 hours if headache persists or recurs.     Allergies:   Bee venom, Cephalosporins, Cyclobenzaprine, Piroxicam, Cephalexin, and Felodipine   Social History   Socioeconomic History   Marital status: Married    Spouse name: Not on file   Number of children: Not on file   Years of education: Not on file   Highest education level: Not on file  Occupational History   Not on file  Tobacco Use   Smoking status: Former   Smokeless tobacco: Never  Vaping  Use   Vaping Use: Never used  Substance and Sexual Activity   Alcohol use: Yes    Alcohol/week: 6.0 standard drinks    Types: 2 Glasses of wine, 2 Cans of beer, 2 Shots of liquor per week    Comment: 2   Drug use: Never   Sexual activity: Not on file  Other Topics Concern   Not on file  Social History Narrative   Not on file   Social Determinants of Health   Financial Resource Strain: Not on file  Food Insecurity: Not on file  Transportation Needs: Not on file  Physical Activity: Not on file  Stress: Not on file  Social Connections: Not on file     Family History: The patient's family history includes Cancer -  Prostate in her father.  ROS:   Please see the history of present illness.    All other systems reviewed and are negative.  EKGs/Labs/Other Studies Reviewed:    The following studies were reviewed today: I discussed my findings with the patient at length.  EKG reveals sinus rhythm and nonspecific ST-T changes   Recent Labs: 12/08/2020: ALT 21; BUN 10; Creatinine, Ser 0.63; Hemoglobin 11.3; Platelets 299; Potassium 3.8; Sodium 137  Recent Lipid Panel No results found for: CHOL, TRIG, HDL, CHOLHDL, VLDL, LDLCALC, LDLDIRECT  Physical Exam:    VS:  BP (!) 160/100 (BP Location: Left Arm, Patient Position: Sitting, Cuff Size: Normal)    Pulse 71    Ht 5\' 8"  (1.727 m)    Wt 294 lb (133.4 kg)    SpO2 98%    BMI 44.70 kg/m     Wt Readings from Last 3 Encounters:  08/03/21 294 lb (133.4 kg)  12/11/20 289 lb 7.4 oz (131.3 kg)  12/08/20 289 lb 8 oz (131.3 kg)     GEN: Patient is in no acute distress HEENT: Normal NECK: No JVD; No carotid bruits LYMPHATICS: No lymphadenopathy CARDIAC: S1 S2 regular, 2/6 systolic murmur at the apex. RESPIRATORY:  Clear to auscultation without rales, wheezing or rhonchi  ABDOMEN: Soft, non-tender, non-distended MUSCULOSKELETAL:  No edema; No deformity  SKIN: Warm and dry NEUROLOGIC:  Alert and oriented x 3 PSYCHIATRIC:  Normal  affect    Signed, Jenean Lindau, MD  08/03/2021 10:16 AM    [place]

## 2021-08-03 NOTE — Patient Instructions (Addendum)
Medication Instructions:  Your physician recommends that you continue on your current medications as directed. Please refer to the Current Medication list given to you today.  *If you need a refill on your cardiac medications before your next appointment, please call your pharmacy*   Lab Work: Your physician recommends that you return for lab work in: TODAY D-dimer, BMP If you have labs (blood work) drawn today and your tests are completely normal, you will receive your results only by: [place] (if you have [place]) OR A paper copy in the mail If you have any lab test that is abnormal or we need to change your treatment, we will call you to review the results.   Testing/Procedures:   [facility] [address] [place], [place] [address] Phone:  [phone]    Please arrive 15 minutes prior to your appointment time for registration and insurance purposes.  The test will take approximately 3 to 4 hours to complete; you may bring reading material.  If someone comes with you to your appointment, they will need to remain in the main lobby due to limited space in the testing area. **If you are pregnant or breastfeeding, please notify the nuclear lab prior to your appointment**  How to prepare for your Myocardial Perfusion Test: Do not eat or drink 3 hours prior to your test, except you may have water. Do not consume products containing caffeine (regular or decaffeinated) 12 hours prior to your test. (ex: coffee, chocolate, sodas, tea). Do bring a list of your current medications with you.  If not listed below, you may take your medications as normal. Do wear comfortable clothes (no dresses or overalls) and walking shoes, tennis shoes preferred (No heels or open toe shoes are allowed). Do NOT wear cologne, perfume, aftershave, or lotions (deodorant is allowed). If these instructions are not followed, your test will have to be rescheduled.  Please  report to [address] for your test.  If you have questions or concerns about your appointment, you can call the [place] [facility] at [phone].  If you cannot keep your appointment, please provide 24 hours notification to the Nuclear Lab, to avoid a possible $50 charge to your account.    Follow-Up: At [facility], you and your health needs are our priority.  As part of our continuing mission to provide you with exceptional heart care, we have created designated Provider Care Teams.  These Care Teams include your primary Cardiologist (physician) and Advanced Practice Providers (APPs -  Physician Assistants and Nurse Practitioners) who all work together to provide you with the care you need, when you need it.  We recommend signing up for the patient portal called "MyChart".  Sign up information is provided on this After Visit Summary.  MyChart is used to connect with patients for Virtual Visits (Telemedicine).  Patients are able to view lab/test results, encounter notes, upcoming appointments, etc.  Non-urgent messages can be sent to your provider as well.   To learn more about what you can do with MyChart, go to [email or web address].    Your next appointment:   2 month(s)  The format for your next appointment:   In Person  Provider:   Dr. Geraldo Pitter   Other Instructions

## 2021-08-04 ENCOUNTER — Telehealth (HOSPITAL_COMMUNITY): Payer: Self-pay | Admitting: *Deleted

## 2021-08-04 NOTE — Telephone Encounter (Signed)
Patient given detailed instructions per Myocardial Perfusion Study Information Sheet for the test on 08/11/21 Patient notified to arrive 15 minutes early and that it is imperative to arrive on time for appointment to keep from having the test rescheduled.  If you need to cancel or reschedule your appointment, please call the office within 24 hours of your appointment. . Patient verbalized understanding. Kirstie Peri

## 2021-08-11 ENCOUNTER — Other Ambulatory Visit: Payer: Self-pay

## 2021-08-11 ENCOUNTER — Telehealth: Payer: Self-pay

## 2021-08-11 ENCOUNTER — Ambulatory Visit (INDEPENDENT_AMBULATORY_CARE_PROVIDER_SITE_OTHER): Payer: Medicare Other

## 2021-08-11 DIAGNOSIS — R079 Chest pain, unspecified: Secondary | ICD-10-CM

## 2021-08-11 DIAGNOSIS — R0609 Other forms of dyspnea: Secondary | ICD-10-CM | POA: Diagnosis not present

## 2021-08-11 MED ORDER — TECHNETIUM TC 99M TETROFOSMIN IV KIT
32.3000 | PACK | Freq: Once | INTRAVENOUS | Status: AC | PRN
  Administered 2021-08-11: 32.3 via INTRAVENOUS

## 2021-08-11 MED ORDER — REGADENOSON 0.4 MG/5ML IV SOLN
0.4000 mg | Freq: Once | INTRAVENOUS | Status: AC
  Administered 2021-08-11: 0.4 mg via INTRAVENOUS

## 2021-08-11 NOTE — Telephone Encounter (Signed)
Called pt and advised to discuss with PCP. Pt states she already had an appointment. No additional questions.

## 2021-08-11 NOTE — Telephone Encounter (Signed)
Patient has a really bad headache and wants to know if it would be okay for her to take imitrex? CB # [phone]

## 2021-08-12 ENCOUNTER — Ambulatory Visit: Payer: Medicare Other

## 2021-08-12 LAB — MYOCARDIAL PERFUSION IMAGING
LV dias vol: 135 mL (ref 46–106)
LV sys vol: 52 mL
Nuc Stress EF: 61 %
Peak HR: 82 {beats}/min
Rest HR: 57 {beats}/min
SDS: 3
SRS: 2
SSS: 5
Stress Nuclear Isotope Dose: 32.3 mCi
TID: 1.29

## 2021-08-27 DIAGNOSIS — M1711 Unilateral primary osteoarthritis, right knee: Secondary | ICD-10-CM | POA: Diagnosis not present

## 2021-09-15 DIAGNOSIS — R195 Other fecal abnormalities: Secondary | ICD-10-CM | POA: Diagnosis not present

## 2021-10-06 ENCOUNTER — Other Ambulatory Visit: Payer: Self-pay

## 2021-10-06 ENCOUNTER — Ambulatory Visit (INDEPENDENT_AMBULATORY_CARE_PROVIDER_SITE_OTHER): Payer: Medicare Other | Admitting: Cardiology

## 2021-10-06 ENCOUNTER — Encounter: Payer: Self-pay | Admitting: Cardiology

## 2021-10-06 VITALS — BP 158/102 | HR 70 | Ht 68.0 in | Wt 304.8 lb

## 2021-10-06 DIAGNOSIS — I10 Essential (primary) hypertension: Secondary | ICD-10-CM

## 2021-10-06 DIAGNOSIS — Z6841 Body Mass Index (BMI) 40.0 and over, adult: Secondary | ICD-10-CM

## 2021-10-06 DIAGNOSIS — E782 Mixed hyperlipidemia: Secondary | ICD-10-CM

## 2021-10-06 NOTE — Patient Instructions (Addendum)

## 2021-10-06 NOTE — Progress Notes (Signed)
?Cardiology Office Note:   ? ?Date:  10/06/2021  ? ?ID:  Kendra [name], DOB Jul 24, 1946, MRN [id number] ? ?PCP:  Mayer Camel, NP  ?Cardiologist:  Jenean Lindau, MD  ? ?Referring MD: Bess Harvest*  ? ? ?ASSESSMENT:   ? ?1. Benign essential hypertension   ?2. Mixed hyperlipidemia   ?3. Morbid obesity with BMI of 40.0-44.9, adult ([place])   ? ?PLAN:   ? ?In order of problems listed above: ? ?Primary prevention stressed with patient.  Importance of compliance with diet medication stressed and she vocalized understanding.  Results of stress test and echocardiogram were discussed with her at length. ?Elevated blood pressure:.  Patient mentions to me that she has a migraine today and also back pain issues.  I told her to go home and take her medications and rest.  She keeps a track of her blood pressures and mentions to me that they are fine. ?Mixed dyslipidemia and morbid obesity: Diet was emphasized lifestyle modification urged lipids followed by primary care.  History of obesity explained and she promises to do better. ?Patient will be seen in follow-up appointment in 9 months or earlier if the patient has any concerns ? ? ? ?Medication Adjustments/Labs and Tests Ordered: ?Current medicines are reviewed at length with the patient today.  Concerns regarding medicines are outlined above.  ?No orders of the defined types were placed in this encounter. ? ?No orders of the defined types were placed in this encounter. ? ? ? ?No chief complaint on file. ?  ? ?History of Present Illness:   ? ?Kendra [name] is a 76 y.o. female Patient has past medical history of essential hypertension, dyslipidemia and morbid obesity.  She mentions to me that she cannot do much because of orthopedic issues such as spinal stenosis.  For this reason she is not happy.  She has gained weight.  At the time of my evaluation, the patient is alert awake oriented and in no distress. ? ?Past Medical History:  ?Diagnosis Date  ?  Anemia   ? Atrophic vaginitis 12/26/2019  ? Formatting of this note might be different from the original. Follows along with urology uses bioidentical hormone  ? Benign essential hypertension 10/08/2015  ? Chest pain in adult 07/02/2015  ? Formatting of this note might be different from the original. Seen at [facility] ED 06/24/15 with chest pain, EKG normal, 2 T normal and discharged  ? Chronic pain 12/11/2020  ? Degenerative disc disease, lumbar   ? Depression   ? Dyspnea on exertion 08/03/2021  ? GERD without esophagitis 10/08/2015  ? High risk medication use 10/08/2015  ? History of COVID-19 08/03/2021  ? 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 Hypertension   ? Insomnia 11/27/2013  ? Iron deficiency 12/30/2020  ? Iron deficiency anemia 12/29/2020  ? Lumbar spondylosis 05/21/2020  ? Malaise and fatigue 05/25/2018  ? Mixed hyperlipidemia   ? Moderate recurrent major depression ([place]) 06/26/2019  ? Morbid obesity ([place])   ? Morbid obesity with BMI of 40.0-44.9, adult ([place]) 11/17/2016  ? Peripheral edema   ? Popliteal pain 07/23/2019  ? Pre-diabetes   ? Primary hypothyroidism 10/08/2015  ? Restless leg syndrome   ? Sleep apnea   ? CPAP  ? Spinal headache   ? from epidural during childbirth  ? Spinal stenosis at L4-L5 level   ? Spondylolisthesis, grade 2 11/27/2013  ? Formatting of this note might be different from the original. L3/4 7.2 mm with L4/5 mod to severe  spinal stenosis, L5/S1 with slight disc on the right Formatting of this note might be different from the original. Overview:  L3/4 7.2 mm with L4/5 mod to severe spinal stenosis, L5/S1 with slight disc on the right  ? Valvular heart disease 07/15/2021  ? ? ?Past Surgical History:  ?Procedure Laterality Date  ? ABDOMINAL HYSTERECTOMY    ? CESAREAN SECTION    ? x2  ? SPINAL CORD STIMULATOR INSERTION N/A 12/11/2020  ? Procedure: SPINAL CORD STIMULATOR INSERTION;  Surgeon: Melina Schools, MD;  Location: [place];  Service: Orthopedics;  Laterality: N/A;  ? ? ?Current Medications: ?Current  Meds  ?Medication Sig  ? ALPRAZolam (XANAX) 0.5 MG tablet Take 0.5 mg by mouth at bedtime as needed for anxiety or sleep.  ? ferrous sulfate 325 (65 FE) MG EC tablet Take 325 mg by mouth daily with breakfast.  ? FLUoxetine (PROZAC) 20 MG capsule Take 20 mg by mouth daily.  ? furosemide (LASIX) 40 MG tablet Take 40 mg by mouth daily as needed for edema.  ? glycopyrrolate (ROBINUL) 1 MG tablet Take 1 mg by mouth daily.  ? HYDROcodone-acetaminophen (NORCO/VICODIN) 5-325 MG tablet Take 0.5 tablets by mouth every 6 (six) hours as needed for moderate pain. Take 1/2 tab as needed for back pain  ? levothyroxine (SYNTHROID) 88 MCG tablet Take 88 mcg by mouth daily before breakfast.  ? omeprazole (PRILOSEC) 20 MG capsule Take 20 mg by mouth daily.  ? pramipexole (MIRAPEX) 1 MG tablet Take 2 mg by mouth 3 (three) times daily.  ? SUMAtriptan (IMITREX) 100 MG tablet Take 100 mg by mouth every 2 (two) hours as needed for migraine. May repeat in 2 hours if headache persists or recurs.  ?  ? ?Allergies:   Bee venom, Cephalosporins, Cyclobenzaprine, Piroxicam, Cephalexin, and Felodipine  ? ?Social History  ? ?Socioeconomic History  ? Marital status: Married  ?  Spouse name: Not on file  ? Number of children: Not on file  ? Years of education: Not on file  ? Highest education level: Not on file  ?Occupational History  ? Not on file  ?Tobacco Use  ? Smoking status: Former  ? Smokeless tobacco: Never  ?Vaping Use  ? Vaping Use: Never used  ?Substance and Sexual Activity  ? Alcohol use: Yes  ?  Alcohol/week: 6.0 standard drinks  ?  Types: 2 Glasses of wine, 2 Cans of beer, 2 Shots of liquor per week  ?  Comment: 2  ? Drug use: Never  ? Sexual activity: Not on file  ?Other Topics Concern  ? Not on file  ?Social History Narrative  ? Not on file  ? ?Social Determinants of Health  ? ?Financial Resource Strain: Not on file  ?Food Insecurity: Not on file  ?Transportation Needs: Not on file  ?Physical Activity: Not on file  ?Stress: Not on  file  ?Social Connections: Not on file  ?  ? ?Family History: ?The patient's family history includes Cancer - Prostate in her father. ? ?ROS:   ?Please see the history of present illness.    ?All other systems reviewed and are negative. ? ?EKGs/Labs/Other Studies Reviewed:   ? ?The following studies were reviewed today: ?Study Highlights ? ?  ?  The study is normal. ?  Left ventricular function is normal. Nuclear stress EF: 61 %. The left ventricular ejection fraction is normal (55-65%). End diastolic cavity size is normal. ?  Prior study not available for comparison. ? ? ?Recent Labs: ?12/08/2020: ALT  21; Hemoglobin 11.3; Platelets 299 ?08/03/2021: BUN 13; Creatinine, Ser 0.58; Potassium 4.3; Sodium 138  ?Recent Lipid Panel ?No results found for: CHOL, TRIG, HDL, CHOLHDL, VLDL, LDLCALC, LDLDIRECT ? ?Physical Exam:   ? ?VS:  BP (!) 158/102   Pulse 70   Ht '5\' 8"'$  (1.727 m)   Wt (!) 304 lb 12.8 oz (138.3 kg)   BMI 46.34 kg/m?    ? ?Wt Readings from Last 3 Encounters:  ?10/06/21 (!) 304 lb 12.8 oz (138.3 kg)  ?08/11/21 294 lb (133.4 kg)  ?08/03/21 294 lb (133.4 kg)  ?  ? ?GEN: Patient is in no acute distress ?HEENT: Normal ?NECK: No JVD; No carotid bruits ?LYMPHATICS: No lymphadenopathy ?CARDIAC: Hear sounds regular, 2/6 systolic murmur at the apex. ?RESPIRATORY:  Clear to auscultation without rales, wheezing or rhonchi  ?ABDOMEN: Soft, non-tender, non-distended ?MUSCULOSKELETAL:  No edema; No deformity  ?SKIN: Warm and dry ?NEUROLOGIC:  Alert and oriented x 3 ?PSYCHIATRIC:  Normal affect  ? ?Signed, ?Jenean Lindau, MD  ?10/06/2021 10:50 AM    ?[place]  ?

## 2021-10-25 DIAGNOSIS — S63501A Unspecified sprain of right wrist, initial encounter: Secondary | ICD-10-CM | POA: Diagnosis not present

## 2021-10-26 DIAGNOSIS — Z9849 Cataract extraction status, unspecified eye: Secondary | ICD-10-CM | POA: Diagnosis not present

## 2021-10-26 DIAGNOSIS — H5213 Myopia, bilateral: Secondary | ICD-10-CM | POA: Diagnosis not present

## 2021-10-26 DIAGNOSIS — H40013 Open angle with borderline findings, low risk, bilateral: Secondary | ICD-10-CM | POA: Diagnosis not present

## 2021-10-26 DIAGNOSIS — Z961 Presence of intraocular lens: Secondary | ICD-10-CM | POA: Diagnosis not present

## 2021-10-26 DIAGNOSIS — H52223 Regular astigmatism, bilateral: Secondary | ICD-10-CM | POA: Diagnosis not present

## 2021-10-26 DIAGNOSIS — H524 Presbyopia: Secondary | ICD-10-CM | POA: Diagnosis not present

## 2021-10-26 DIAGNOSIS — H40003 Preglaucoma, unspecified, bilateral: Secondary | ICD-10-CM | POA: Diagnosis not present

## 2021-10-26 DIAGNOSIS — H47233 Glaucomatous optic atrophy, bilateral: Secondary | ICD-10-CM | POA: Diagnosis not present

## 2021-11-03 DIAGNOSIS — M25531 Pain in right wrist: Secondary | ICD-10-CM | POA: Diagnosis not present

## 2021-11-03 DIAGNOSIS — S52309A Unspecified fracture of shaft of unspecified radius, initial encounter for closed fracture: Secondary | ICD-10-CM | POA: Diagnosis not present

## 2021-11-03 DIAGNOSIS — S52301A Unspecified fracture of shaft of right radius, initial encounter for closed fracture: Secondary | ICD-10-CM | POA: Diagnosis not present

## 2021-11-03 DIAGNOSIS — S52501A Unspecified fracture of the lower end of right radius, initial encounter for closed fracture: Secondary | ICD-10-CM | POA: Diagnosis not present

## 2021-12-01 DIAGNOSIS — M25531 Pain in right wrist: Secondary | ICD-10-CM | POA: Diagnosis not present

## 2021-12-01 DIAGNOSIS — S52131D Displaced fracture of neck of right radius, subsequent encounter for closed fracture with routine healing: Secondary | ICD-10-CM | POA: Diagnosis not present

## 2021-12-01 DIAGNOSIS — S52309A Unspecified fracture of shaft of unspecified radius, initial encounter for closed fracture: Secondary | ICD-10-CM | POA: Diagnosis not present

## 2021-12-01 DIAGNOSIS — S52301A Unspecified fracture of shaft of right radius, initial encounter for closed fracture: Secondary | ICD-10-CM | POA: Diagnosis not present

## 2021-12-19 IMAGING — RF DG THORACOLUMBAR SPINE 2V
1 series · 2 of 2 positions shown · non-contrast
Comparison: Chest radiograph 12/08/2020. Radiographs of the
thoracic spine 10/08/2015.

CLINICAL DATA: Surgery, elective. Additional history provided:
Spinal cord stimulator insertion for chronic pain syndrome. Provided
fluoroscopy time 50 seconds (51.39 mGy).

EXAM:
THORACOLUMBAR SPINE 1V

[Series 1: run · 2 of 2 slices shown]
[im 1/2]
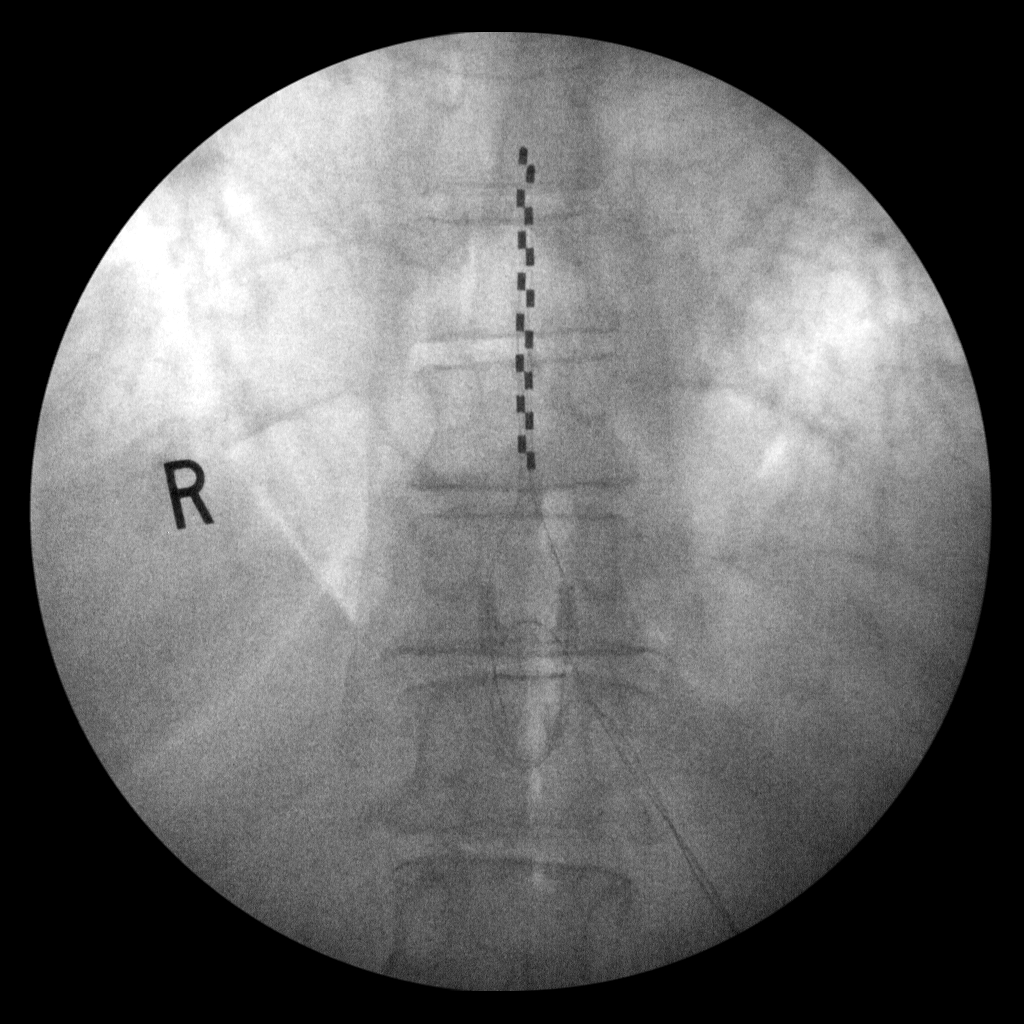
[im 2/2]
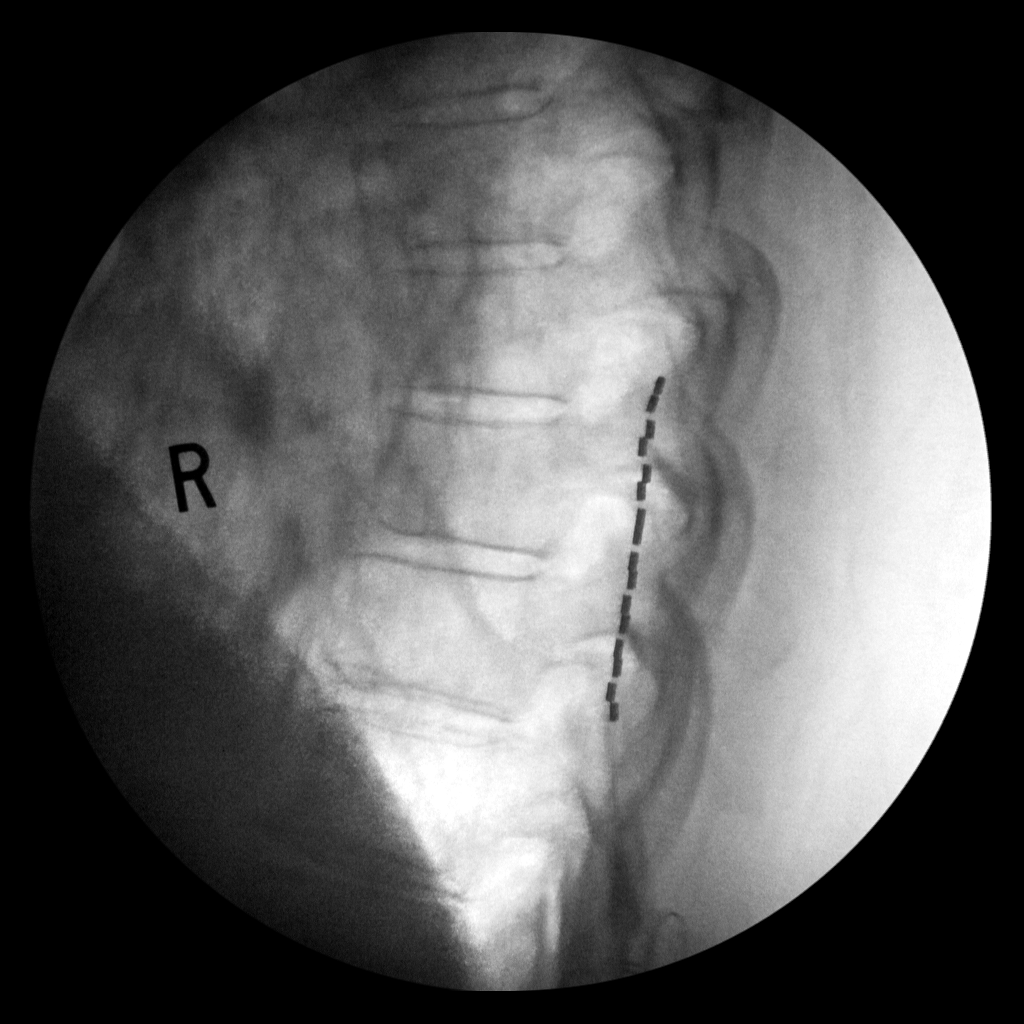

[2 of 2 positions shown; findings below may reference images not displayed]

FINDINGS: AP and lateral view intraprocedural fluoroscopic images of the
thoracic spine are submitted. On the provided images, two spinal
cord stimulator leads project within the dorsal aspect of the
thoracic spinal canal. The exact position of lead termination is
difficult to ascertain given the provided field of view. Correlate
with the procedural history.
IMPRESSION: Two intraprocedural fluoroscopic images from spinal cord stimulator
lead placement, as described.

## 2021-12-19 IMAGING — RF DG C-ARM 1-60 MIN
1 series · 2 of 2 positions shown · non-contrast
Comparison: Chest radiograph 12/08/2020. Radiographs of the
thoracic spine 10/08/2015.

CLINICAL DATA: Surgery, elective. Additional history provided:
Spinal cord stimulator insertion for chronic pain syndrome. Provided
fluoroscopy time 50 seconds (51.39 mGy).

EXAM:
THORACOLUMBAR SPINE 1V

[Series 1: run · 2 of 2 slices shown]
[im 1/2]
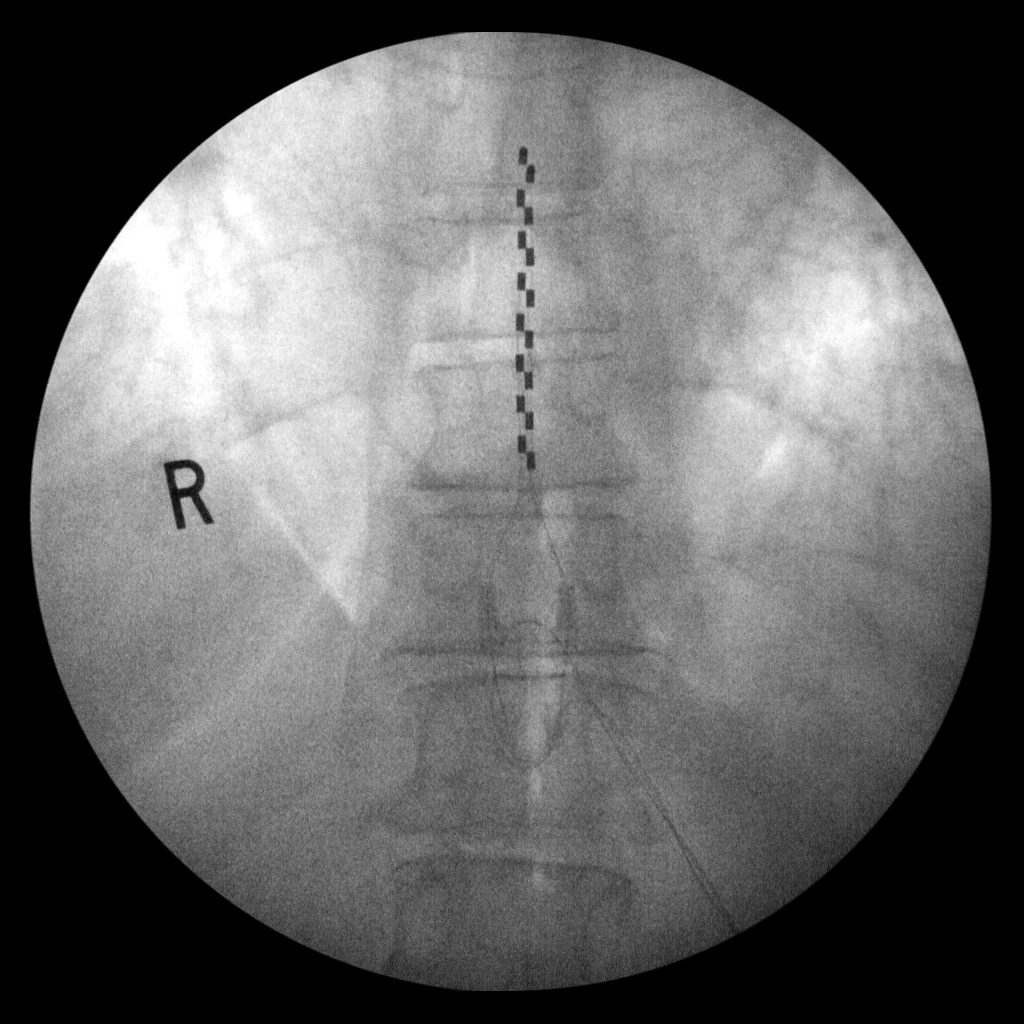
[im 2/2]
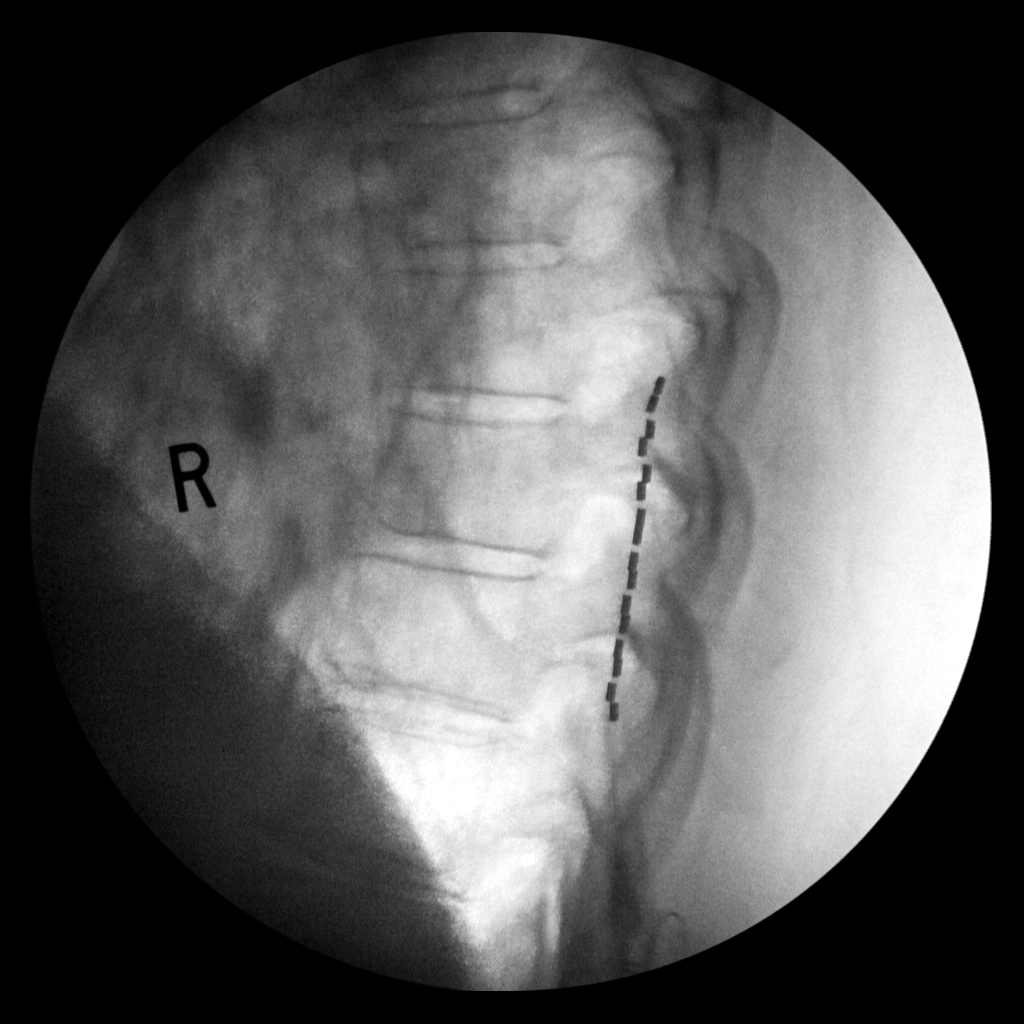

[2 of 2 positions shown; findings below may reference images not displayed]

FINDINGS: AP and lateral view intraprocedural fluoroscopic images of the
thoracic spine are submitted. On the provided images, two spinal
cord stimulator leads project within the dorsal aspect of the
thoracic spinal canal. The exact position of lead termination is
difficult to ascertain given the provided field of view. Correlate
with the procedural history.
IMPRESSION: Two intraprocedural fluoroscopic images from spinal cord stimulator
lead placement, as described.

## 2021-12-29 DIAGNOSIS — M1711 Unilateral primary osteoarthritis, right knee: Secondary | ICD-10-CM | POA: Diagnosis not present

## 2021-12-29 DIAGNOSIS — M1712 Unilateral primary osteoarthritis, left knee: Secondary | ICD-10-CM | POA: Diagnosis not present

## 2021-12-29 DIAGNOSIS — M170 Bilateral primary osteoarthritis of knee: Secondary | ICD-10-CM | POA: Diagnosis not present

## 2021-12-29 DIAGNOSIS — M25562 Pain in left knee: Secondary | ICD-10-CM | POA: Diagnosis not present

## 2022-02-05 DIAGNOSIS — N3001 Acute cystitis with hematuria: Secondary | ICD-10-CM | POA: Diagnosis not present

## 2022-02-05 DIAGNOSIS — Z1231 Encounter for screening mammogram for malignant neoplasm of breast: Secondary | ICD-10-CM | POA: Diagnosis not present

## 2022-02-08 DIAGNOSIS — N200 Calculus of kidney: Secondary | ICD-10-CM | POA: Diagnosis not present

## 2022-02-08 DIAGNOSIS — M47816 Spondylosis without myelopathy or radiculopathy, lumbar region: Secondary | ICD-10-CM | POA: Diagnosis not present

## 2022-02-08 DIAGNOSIS — M4186 Other forms of scoliosis, lumbar region: Secondary | ICD-10-CM | POA: Diagnosis not present

## 2022-02-08 DIAGNOSIS — Z87442 Personal history of urinary calculi: Secondary | ICD-10-CM | POA: Diagnosis not present

## 2022-02-08 DIAGNOSIS — M47814 Spondylosis without myelopathy or radiculopathy, thoracic region: Secondary | ICD-10-CM | POA: Diagnosis not present

## 2022-02-12 DIAGNOSIS — N390 Urinary tract infection, site not specified: Secondary | ICD-10-CM | POA: Diagnosis not present

## 2022-02-12 DIAGNOSIS — D509 Iron deficiency anemia, unspecified: Secondary | ICD-10-CM | POA: Diagnosis not present

## 2022-02-12 DIAGNOSIS — I10 Essential (primary) hypertension: Secondary | ICD-10-CM | POA: Diagnosis not present

## 2022-02-12 DIAGNOSIS — R5383 Other fatigue: Secondary | ICD-10-CM | POA: Diagnosis not present

## 2022-02-12 DIAGNOSIS — Z0000 Encounter for general adult medical examination without abnormal findings: Secondary | ICD-10-CM | POA: Diagnosis not present

## 2022-02-12 DIAGNOSIS — Z79899 Other long term (current) drug therapy: Secondary | ICD-10-CM | POA: Diagnosis not present

## 2022-02-12 DIAGNOSIS — N952 Postmenopausal atrophic vaginitis: Secondary | ICD-10-CM | POA: Diagnosis not present

## 2022-02-12 DIAGNOSIS — R7303 Prediabetes: Secondary | ICD-10-CM | POA: Diagnosis not present

## 2022-02-12 DIAGNOSIS — K219 Gastro-esophageal reflux disease without esophagitis: Secondary | ICD-10-CM | POA: Diagnosis not present

## 2022-02-12 DIAGNOSIS — E611 Iron deficiency: Secondary | ICD-10-CM | POA: Diagnosis not present

## 2022-02-12 DIAGNOSIS — F331 Major depressive disorder, recurrent, moderate: Secondary | ICD-10-CM | POA: Diagnosis not present

## 2022-02-12 DIAGNOSIS — R609 Edema, unspecified: Secondary | ICD-10-CM | POA: Diagnosis not present

## 2022-02-12 DIAGNOSIS — I38 Endocarditis, valve unspecified: Secondary | ICD-10-CM | POA: Diagnosis not present

## 2022-02-12 DIAGNOSIS — E782 Mixed hyperlipidemia: Secondary | ICD-10-CM | POA: Diagnosis not present

## 2022-02-12 DIAGNOSIS — R5381 Other malaise: Secondary | ICD-10-CM | POA: Diagnosis not present

## 2022-02-12 DIAGNOSIS — M4310 Spondylolisthesis, site unspecified: Secondary | ICD-10-CM | POA: Diagnosis not present

## 2022-02-12 DIAGNOSIS — E039 Hypothyroidism, unspecified: Secondary | ICD-10-CM | POA: Diagnosis not present

## 2022-02-17 DIAGNOSIS — Z86010 Personal history of colonic polyps: Secondary | ICD-10-CM | POA: Diagnosis not present

## 2022-02-17 DIAGNOSIS — R195 Other fecal abnormalities: Secondary | ICD-10-CM | POA: Diagnosis not present

## 2022-02-17 DIAGNOSIS — D500 Iron deficiency anemia secondary to blood loss (chronic): Secondary | ICD-10-CM | POA: Diagnosis not present

## 2022-02-24 DIAGNOSIS — N952 Postmenopausal atrophic vaginitis: Secondary | ICD-10-CM | POA: Diagnosis not present

## 2022-02-24 DIAGNOSIS — N390 Urinary tract infection, site not specified: Secondary | ICD-10-CM | POA: Diagnosis not present

## 2022-02-24 DIAGNOSIS — Z87442 Personal history of urinary calculi: Secondary | ICD-10-CM | POA: Diagnosis not present

## 2022-02-24 DIAGNOSIS — R1032 Left lower quadrant pain: Secondary | ICD-10-CM | POA: Diagnosis not present

## 2022-02-24 DIAGNOSIS — N281 Cyst of kidney, acquired: Secondary | ICD-10-CM | POA: Diagnosis not present

## 2022-02-24 DIAGNOSIS — R1012 Left upper quadrant pain: Secondary | ICD-10-CM | POA: Diagnosis not present

## 2022-02-24 DIAGNOSIS — R3989 Other symptoms and signs involving the genitourinary system: Secondary | ICD-10-CM | POA: Diagnosis not present

## 2022-02-24 DIAGNOSIS — Z79899 Other long term (current) drug therapy: Secondary | ICD-10-CM | POA: Diagnosis not present

## 2022-02-24 DIAGNOSIS — D1771 Benign lipomatous neoplasm of kidney: Secondary | ICD-10-CM | POA: Diagnosis not present

## 2022-03-09 DIAGNOSIS — H1045 Other chronic allergic conjunctivitis: Secondary | ICD-10-CM | POA: Diagnosis not present

## 2022-03-09 DIAGNOSIS — H1031 Unspecified acute conjunctivitis, right eye: Secondary | ICD-10-CM | POA: Diagnosis not present

## 2022-03-22 DIAGNOSIS — K621 Rectal polyp: Secondary | ICD-10-CM | POA: Diagnosis not present

## 2022-03-22 DIAGNOSIS — D128 Benign neoplasm of rectum: Secondary | ICD-10-CM | POA: Diagnosis not present

## 2022-03-22 DIAGNOSIS — K5730 Diverticulosis of large intestine without perforation or abscess without bleeding: Secondary | ICD-10-CM | POA: Diagnosis not present

## 2022-03-22 DIAGNOSIS — Z86010 Personal history of colonic polyps: Secondary | ICD-10-CM | POA: Diagnosis not present

## 2022-03-22 DIAGNOSIS — Z1211 Encounter for screening for malignant neoplasm of colon: Secondary | ICD-10-CM | POA: Diagnosis not present

## 2022-03-22 DIAGNOSIS — K635 Polyp of colon: Secondary | ICD-10-CM | POA: Diagnosis not present

## 2022-04-08 DIAGNOSIS — N390 Urinary tract infection, site not specified: Secondary | ICD-10-CM | POA: Diagnosis not present

## 2022-04-08 DIAGNOSIS — N952 Postmenopausal atrophic vaginitis: Secondary | ICD-10-CM | POA: Diagnosis not present

## 2022-04-13 DIAGNOSIS — M1711 Unilateral primary osteoarthritis, right knee: Secondary | ICD-10-CM | POA: Diagnosis not present

## 2022-04-30 DIAGNOSIS — H47233 Glaucomatous optic atrophy, bilateral: Secondary | ICD-10-CM | POA: Diagnosis not present

## 2022-04-30 DIAGNOSIS — H40013 Open angle with borderline findings, low risk, bilateral: Secondary | ICD-10-CM | POA: Diagnosis not present

## 2022-05-07 DIAGNOSIS — M545 Low back pain, unspecified: Secondary | ICD-10-CM | POA: Diagnosis not present

## 2022-05-07 DIAGNOSIS — M7062 Trochanteric bursitis, left hip: Secondary | ICD-10-CM | POA: Diagnosis not present

## 2022-06-08 DIAGNOSIS — H401131 Primary open-angle glaucoma, bilateral, mild stage: Secondary | ICD-10-CM | POA: Diagnosis not present

## 2022-06-08 DIAGNOSIS — H47233 Glaucomatous optic atrophy, bilateral: Secondary | ICD-10-CM | POA: Diagnosis not present

## 2022-07-09 DIAGNOSIS — H47233 Glaucomatous optic atrophy, bilateral: Secondary | ICD-10-CM | POA: Diagnosis not present

## 2022-07-09 DIAGNOSIS — H401131 Primary open-angle glaucoma, bilateral, mild stage: Secondary | ICD-10-CM | POA: Diagnosis not present

## 2022-07-12 DIAGNOSIS — N3941 Urge incontinence: Secondary | ICD-10-CM | POA: Diagnosis not present

## 2022-07-12 DIAGNOSIS — N390 Urinary tract infection, site not specified: Secondary | ICD-10-CM | POA: Diagnosis not present

## 2022-07-12 DIAGNOSIS — N952 Postmenopausal atrophic vaginitis: Secondary | ICD-10-CM | POA: Diagnosis not present

## 2022-07-13 DIAGNOSIS — M1712 Unilateral primary osteoarthritis, left knee: Secondary | ICD-10-CM | POA: Diagnosis not present

## 2022-07-13 DIAGNOSIS — M1711 Unilateral primary osteoarthritis, right knee: Secondary | ICD-10-CM | POA: Diagnosis not present

## 2022-07-13 DIAGNOSIS — M25552 Pain in left hip: Secondary | ICD-10-CM | POA: Diagnosis not present

## 2022-07-16 DIAGNOSIS — Z23 Encounter for immunization: Secondary | ICD-10-CM | POA: Diagnosis not present

## 2022-08-05 ENCOUNTER — Ambulatory Visit: Payer: Medicare Other | Attending: Cardiology | Admitting: Cardiology

## 2022-08-05 ENCOUNTER — Encounter: Payer: Self-pay | Admitting: Cardiology

## 2022-08-05 VITALS — BP 128/78 | HR 75 | Ht 68.0 in | Wt 293.6 lb

## 2022-08-05 DIAGNOSIS — I10 Essential (primary) hypertension: Secondary | ICD-10-CM | POA: Diagnosis not present

## 2022-08-05 DIAGNOSIS — Z6841 Body Mass Index (BMI) 40.0 and over, adult: Secondary | ICD-10-CM

## 2022-08-05 DIAGNOSIS — G4730 Sleep apnea, unspecified: Secondary | ICD-10-CM | POA: Insufficient documentation

## 2022-08-05 NOTE — Progress Notes (Signed)
Cardiology Office Note:    Date:  08/05/2022   ID:  Kendra [name], DOB [birth year], MRN [id number]  PCP:  Mayer Camel, NP  Cardiologist:  Jenean Lindau, MD   Referring MD: Bess Harvest*    ASSESSMENT:    1. Benign essential hypertension   2. Sleep apnea, unspecified type   3. Morbid obesity with BMI of 40.0-44.9, adult ([place])    PLAN:    In order of problems listed above:  Primary prevention stressed with the patient.  Importance of compliance with diet medication stressed and she vocalized understanding. Sleep apnea: Sleep health issues were discussed. She does not have any specific cardiovascular issues at this point such as shortness of breath or chest pain.  Overall she leads a sedentary lifestyle and I encouraged her to be ambulatory.  Her back pain is a significant issue for her. Morbid obesity: Weight reduction stressed.  Diet emphasized.  She promises to do better. Patient will be seen in follow-up appointment in 6 months or earlier if the patient has any concerns    Medication Adjustments/Labs and Tests Ordered: Current medicines are reviewed at length with the patient today.  Concerns regarding medicines are outlined above.  No orders of the defined types were placed in this encounter.  No orders of the defined types were placed in this encounter.    No chief complaint on file.    History of Present Illness:    Kendra [name] is a 77 y.o. female.  Patient has history of essential hypertension, morbid obesity and sleep apnea.  She has significant pain issues with her back and has a stimulator and tells me that it does not help much.  She has done well with blood pressure management with lifestyle modification and salt intake issues in the diet.  Her blood pressure is now stable and normal without any medicines.  At the time of my evaluation, the patient is alert awake oriented and in no distress.  Past Medical History:  Diagnosis Date    Anemia    Atrophic vaginitis 12/26/2019   Formatting of this note might be different from the original. Follows along with urology uses bioidentical hormone   Benign essential hypertension 10/08/2015   Chest pain in adult 07/02/2015   Formatting of this note might be different from the original. 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   Hypertension    Insomnia 11/27/2013   Iron deficiency 12/30/2020   Iron deficiency anemia 12/29/2020   Lumbar spondylosis 05/21/2020   Malaise and fatigue 05/25/2018   Mixed hyperlipidemia    Moderate recurrent major depression ([place]) 06/26/2019   Morbid obesity ([place])    Morbid obesity with BMI of 40.0-44.9, adult ([place]) 11/17/2016   Peripheral edema    Popliteal pain 07/23/2019   Pre-diabetes    Primary hypothyroidism 10/08/2015   Restless leg syndrome    Sleep apnea    CPAP   Spinal headache    from epidural during childbirth   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 L3/4 7.2 mm with L4/5 mod to severe  spinal stenosis, L5/S1 with slight disc on the right Formatting of this note might be different from the original. Overview:  L3/4 7.2 mm with L4/5 mod to severe spinal stenosis, L5/S1 with slight disc on the right   Valvular heart disease 07/15/2021    Past Surgical History:  Procedure Laterality Date   ABDOMINAL HYSTERECTOMY     CESAREAN SECTION     x2   SPINAL CORD STIMULATOR INSERTION N/A 12/11/2020   Procedure: SPINAL CORD STIMULATOR INSERTION;  Surgeon: Melina Schools, MD;  Location: [place];  Service: Orthopedics;  Laterality: N/A;    Current  Medications: Current Meds  Medication Sig   ALPRAZolam (XANAX) 0.5 MG tablet Take 0.5 mg by mouth at bedtime as needed for anxiety or sleep.   celecoxib (CELEBREX) 200 MG capsule Take 200 mg by mouth daily.   EPINEPHrine 0.3 mg/0.3 mL IJ SOAJ injection Inject 0.3 mg into the muscle as needed for anaphylaxis.   ferrous sulfate 325 (65 FE) MG EC tablet Take 325 mg by mouth daily.   FLUoxetine (PROZAC) 20 MG capsule Take 20 mg by mouth daily.   furosemide (LASIX) 40 MG tablet Take 40 mg by mouth daily as needed for edema.   glycopyrrolate (ROBINUL) 1 MG tablet Take 1 mg by mouth as needed (excessive sweating).   HYDROcodone-acetaminophen (NORCO/VICODIN) 5-325 MG tablet Take 0.5 tablets by mouth every 6 (six) hours as needed for moderate pain. Take 1/2 tab as needed for back pain   latanoprost (XALATAN) 0.005 % ophthalmic solution Place 1 drop into both eyes at bedtime.   levothyroxine (SYNTHROID) 88 MCG tablet Take 88 mcg by mouth daily before breakfast.   omeprazole (PRILOSEC) 20 MG capsule Take 20 mg by mouth daily.   pramipexole (MIRAPEX) 1 MG tablet Take 2 mg by mouth 3 (three) times daily.   SUMAtriptan (IMITREX) 100 MG tablet Take 100 mg by mouth every 2 (two) hours as needed for migraine. May repeat in 2 hours if headache persists or recurs.     Allergies:   Bee venom, Cephalosporins, Cyclobenzaprine, Piroxicam, Cephalexin, and Felodipine   Social History   Socioeconomic History   Marital status: Married    Spouse name: Not on file   Number of children: Not on file   Years of education: Not on file   Highest education level: Not on file  Occupational History   Not on file  Tobacco Use   Smoking status: Former   Smokeless tobacco: Never  Vaping Use   Vaping Use: Never used  Substance and Sexual Activity   Alcohol use: Yes    Alcohol/week: 6.0 standard drinks of alcohol    Types: 2 Glasses of wine, 2 Cans of beer, 2 Shots of liquor per week    Comment: 2   Drug use: Never    Sexual activity: Not on file  Other Topics Concern   Not on file  Social History Narrative   Not on file   Social Determinants of Health   Financial Resource Strain: Not on file  Food Insecurity: Not on file  Transportation Needs: Not on file  Physical Activity: Not on file  Stress: Not on file  Social Connections: Not on file     Family History: The patient's family history includes Cancer - Prostate in her father.  ROS:   Please see the history of present illness.    All other systems reviewed and are negative.  EKGs/Labs/Other Studies Reviewed:    The following studies were reviewed today:  EKG reveals sinus rhythm and nonspecific ST-T changes   Recent Labs: No results found for requested labs within last 365 days.  Recent Lipid Panel No results found for: "CHOL", "TRIG", "HDL", "CHOLHDL", "VLDL", "LDLCALC", "LDLDIRECT"  Physical Exam:    VS:  BP 128/78   Pulse 75   Ht '5\' 8"'$  (1.727 m)   Wt 293 lb 9.6 oz (133.2 kg)   SpO2 90%   BMI 44.64 kg/m     Wt Readings from Last 3 Encounters:  08/05/22 293 lb 9.6 oz (133.2 kg)  10/06/21 (!) 304 lb 12.8 oz (138.3 kg)  08/11/21 294 lb (133.4 kg)     GEN: Patient is in no acute distress HEENT: Normal NECK: No JVD; No carotid bruits LYMPHATICS: No lymphadenopathy CARDIAC: Hear sounds regular, 2/6 systolic murmur at the apex. RESPIRATORY:  Clear to auscultation without rales, wheezing or rhonchi  ABDOMEN: Soft, non-tender, non-distended MUSCULOSKELETAL:  No edema; No deformity  SKIN: Warm and dry NEUROLOGIC:  Alert and oriented x 3 PSYCHIATRIC:  Normal affect   Signed, Jenean Lindau, MD  08/05/2022 1:51 PM    [facility]

## 2022-08-05 NOTE — Patient Instructions (Signed)
Medication Instructions:  Your physician recommends that you continue on your current medications as directed. Please refer to the Current Medication list given to you today.  *If you need a refill on your cardiac medications before your next appointment, please call your pharmacy*   Lab Work: None ordered If you have labs (blood work) drawn today and your tests are completely normal, you will receive your results only by: MyChart Message (if you have MyChart) OR A paper copy in the mail If you have any lab test that is abnormal or we need to change your treatment, we will call you to review the results.   Testing/Procedures: None ordered   Follow-Up: At [facility], you and your health needs are our priority.  As part of our continuing mission to provide you with exceptional heart care, we have created designated Provider Care Teams.  These Care Teams include your primary Cardiologist (physician) and Advanced Practice Providers (APPs -  Physician Assistants and Nurse Practitioners) who all work together to provide you with the care you need, when you need it.  We recommend signing up for the patient portal called "MyChart".  Sign up information is provided on this After Visit Summary.  MyChart is used to connect with patients for Virtual Visits (Telemedicine).  Patients are able to view lab/test results, encounter notes, upcoming appointments, etc.  Non-urgent messages can be sent to your provider as well.   To learn more about what you can do with MyChart, go to [email or web address].    Your next appointment:   12 month(s)  The format for your next appointment:   In Person  Provider:   Rajan Revankar, MD    Other Instructions none  Important Information About Sugar      

## 2022-08-10 DIAGNOSIS — N3941 Urge incontinence: Secondary | ICD-10-CM | POA: Diagnosis not present

## 2022-08-10 DIAGNOSIS — N952 Postmenopausal atrophic vaginitis: Secondary | ICD-10-CM | POA: Diagnosis not present

## 2022-08-10 DIAGNOSIS — N390 Urinary tract infection, site not specified: Secondary | ICD-10-CM | POA: Diagnosis not present

## 2022-08-16 DIAGNOSIS — R5383 Other fatigue: Secondary | ICD-10-CM | POA: Diagnosis not present

## 2022-08-16 DIAGNOSIS — Z6841 Body Mass Index (BMI) 40.0 and over, adult: Secondary | ICD-10-CM | POA: Diagnosis not present

## 2022-08-16 DIAGNOSIS — R7303 Prediabetes: Secondary | ICD-10-CM | POA: Diagnosis not present

## 2022-08-16 DIAGNOSIS — R609 Edema, unspecified: Secondary | ICD-10-CM | POA: Diagnosis not present

## 2022-08-16 DIAGNOSIS — E782 Mixed hyperlipidemia: Secondary | ICD-10-CM | POA: Diagnosis not present

## 2022-08-16 DIAGNOSIS — F331 Major depressive disorder, recurrent, moderate: Secondary | ICD-10-CM | POA: Diagnosis not present

## 2022-08-16 DIAGNOSIS — D509 Iron deficiency anemia, unspecified: Secondary | ICD-10-CM | POA: Diagnosis not present

## 2022-08-16 DIAGNOSIS — M4310 Spondylolisthesis, site unspecified: Secondary | ICD-10-CM | POA: Diagnosis not present

## 2022-08-16 DIAGNOSIS — Z79899 Other long term (current) drug therapy: Secondary | ICD-10-CM | POA: Diagnosis not present

## 2022-08-16 DIAGNOSIS — R5381 Other malaise: Secondary | ICD-10-CM | POA: Diagnosis not present

## 2022-08-16 DIAGNOSIS — I10 Essential (primary) hypertension: Secondary | ICD-10-CM | POA: Diagnosis not present

## 2022-08-16 DIAGNOSIS — E039 Hypothyroidism, unspecified: Secondary | ICD-10-CM | POA: Diagnosis not present

## 2022-08-16 DIAGNOSIS — K219 Gastro-esophageal reflux disease without esophagitis: Secondary | ICD-10-CM | POA: Diagnosis not present

## 2022-08-16 DIAGNOSIS — E611 Iron deficiency: Secondary | ICD-10-CM | POA: Diagnosis not present

## 2022-08-19 DIAGNOSIS — M48061 Spinal stenosis, lumbar region without neurogenic claudication: Secondary | ICD-10-CM | POA: Diagnosis not present

## 2022-08-23 DIAGNOSIS — M47816 Spondylosis without myelopathy or radiculopathy, lumbar region: Secondary | ICD-10-CM | POA: Diagnosis not present

## 2022-08-23 DIAGNOSIS — M48061 Spinal stenosis, lumbar region without neurogenic claudication: Secondary | ICD-10-CM | POA: Diagnosis not present

## 2022-08-23 DIAGNOSIS — M47896 Other spondylosis, lumbar region: Secondary | ICD-10-CM | POA: Diagnosis not present

## 2022-08-23 DIAGNOSIS — M5136 Other intervertebral disc degeneration, lumbar region: Secondary | ICD-10-CM | POA: Diagnosis not present

## 2022-08-26 DIAGNOSIS — M48061 Spinal stenosis, lumbar region without neurogenic claudication: Secondary | ICD-10-CM | POA: Diagnosis not present

## 2022-08-26 DIAGNOSIS — M47816 Spondylosis without myelopathy or radiculopathy, lumbar region: Secondary | ICD-10-CM | POA: Diagnosis not present

## 2022-08-26 DIAGNOSIS — M5136 Other intervertebral disc degeneration, lumbar region: Secondary | ICD-10-CM | POA: Diagnosis not present

## 2022-09-07 DIAGNOSIS — M4316 Spondylolisthesis, lumbar region: Secondary | ICD-10-CM | POA: Diagnosis not present

## 2022-09-13 DIAGNOSIS — Z79899 Other long term (current) drug therapy: Secondary | ICD-10-CM | POA: Diagnosis not present

## 2022-09-13 DIAGNOSIS — Z5181 Encounter for therapeutic drug level monitoring: Secondary | ICD-10-CM | POA: Diagnosis not present

## 2022-09-13 DIAGNOSIS — M25552 Pain in left hip: Secondary | ICD-10-CM | POA: Diagnosis not present

## 2022-09-14 DIAGNOSIS — M79652 Pain in left thigh: Secondary | ICD-10-CM | POA: Diagnosis not present

## 2022-09-17 DIAGNOSIS — M79652 Pain in left thigh: Secondary | ICD-10-CM | POA: Diagnosis not present

## 2022-09-21 DIAGNOSIS — M79652 Pain in left thigh: Secondary | ICD-10-CM | POA: Diagnosis not present

## 2022-09-24 DIAGNOSIS — M79652 Pain in left thigh: Secondary | ICD-10-CM | POA: Diagnosis not present

## 2022-09-28 DIAGNOSIS — M79652 Pain in left thigh: Secondary | ICD-10-CM | POA: Diagnosis not present

## 2022-09-29 DIAGNOSIS — M1612 Unilateral primary osteoarthritis, left hip: Secondary | ICD-10-CM | POA: Diagnosis not present

## 2022-09-29 DIAGNOSIS — Z6841 Body Mass Index (BMI) 40.0 and over, adult: Secondary | ICD-10-CM | POA: Diagnosis not present

## 2022-10-12 DIAGNOSIS — M1711 Unilateral primary osteoarthritis, right knee: Secondary | ICD-10-CM | POA: Diagnosis not present

## 2022-10-12 DIAGNOSIS — M1712 Unilateral primary osteoarthritis, left knee: Secondary | ICD-10-CM | POA: Diagnosis not present

## 2022-10-26 DIAGNOSIS — M25552 Pain in left hip: Secondary | ICD-10-CM | POA: Diagnosis not present

## 2022-11-02 DIAGNOSIS — M7062 Trochanteric bursitis, left hip: Secondary | ICD-10-CM | POA: Diagnosis not present

## 2022-11-02 DIAGNOSIS — M1612 Unilateral primary osteoarthritis, left hip: Secondary | ICD-10-CM | POA: Diagnosis not present

## 2022-11-02 DIAGNOSIS — M25552 Pain in left hip: Secondary | ICD-10-CM | POA: Diagnosis not present

## 2022-11-18 DIAGNOSIS — R7303 Prediabetes: Secondary | ICD-10-CM | POA: Diagnosis not present

## 2022-11-18 DIAGNOSIS — I10 Essential (primary) hypertension: Secondary | ICD-10-CM | POA: Diagnosis not present

## 2022-11-18 DIAGNOSIS — E039 Hypothyroidism, unspecified: Secondary | ICD-10-CM | POA: Diagnosis not present

## 2022-11-18 DIAGNOSIS — Z1331 Encounter for screening for depression: Secondary | ICD-10-CM | POA: Diagnosis not present

## 2022-11-18 DIAGNOSIS — F5081 Binge eating disorder: Secondary | ICD-10-CM | POA: Diagnosis not present

## 2022-11-18 DIAGNOSIS — E8889 Other specified metabolic disorders: Secondary | ICD-10-CM | POA: Diagnosis not present

## 2022-11-18 DIAGNOSIS — E559 Vitamin D deficiency, unspecified: Secondary | ICD-10-CM | POA: Diagnosis not present

## 2022-11-18 DIAGNOSIS — E782 Mixed hyperlipidemia: Secondary | ICD-10-CM | POA: Diagnosis not present

## 2022-11-18 DIAGNOSIS — G4733 Obstructive sleep apnea (adult) (pediatric): Secondary | ICD-10-CM | POA: Diagnosis not present

## 2022-11-18 DIAGNOSIS — Z6841 Body Mass Index (BMI) 40.0 and over, adult: Secondary | ICD-10-CM | POA: Diagnosis not present

## 2022-11-22 DIAGNOSIS — M47816 Spondylosis without myelopathy or radiculopathy, lumbar region: Secondary | ICD-10-CM | POA: Diagnosis not present

## 2022-11-22 DIAGNOSIS — M48061 Spinal stenosis, lumbar region without neurogenic claudication: Secondary | ICD-10-CM | POA: Diagnosis not present

## 2022-11-22 DIAGNOSIS — M5136 Other intervertebral disc degeneration, lumbar region: Secondary | ICD-10-CM | POA: Diagnosis not present

## 2022-11-22 DIAGNOSIS — Z5181 Encounter for therapeutic drug level monitoring: Secondary | ICD-10-CM | POA: Diagnosis not present

## 2022-11-25 DIAGNOSIS — N390 Urinary tract infection, site not specified: Secondary | ICD-10-CM | POA: Diagnosis not present

## 2022-11-25 DIAGNOSIS — N3941 Urge incontinence: Secondary | ICD-10-CM | POA: Diagnosis not present

## 2022-11-25 DIAGNOSIS — N952 Postmenopausal atrophic vaginitis: Secondary | ICD-10-CM | POA: Diagnosis not present

## 2022-12-07 ENCOUNTER — Other Ambulatory Visit (HOSPITAL_COMMUNITY): Payer: Self-pay

## 2022-12-07 DIAGNOSIS — Z6841 Body Mass Index (BMI) 40.0 and over, adult: Secondary | ICD-10-CM | POA: Diagnosis not present

## 2022-12-07 DIAGNOSIS — E559 Vitamin D deficiency, unspecified: Secondary | ICD-10-CM | POA: Diagnosis not present

## 2022-12-07 DIAGNOSIS — I10 Essential (primary) hypertension: Secondary | ICD-10-CM | POA: Diagnosis not present

## 2022-12-07 DIAGNOSIS — E039 Hypothyroidism, unspecified: Secondary | ICD-10-CM | POA: Diagnosis not present

## 2022-12-07 DIAGNOSIS — F5081 Binge eating disorder: Secondary | ICD-10-CM | POA: Diagnosis not present

## 2022-12-07 DIAGNOSIS — R7303 Prediabetes: Secondary | ICD-10-CM | POA: Diagnosis not present

## 2022-12-07 DIAGNOSIS — G4733 Obstructive sleep apnea (adult) (pediatric): Secondary | ICD-10-CM | POA: Diagnosis not present

## 2022-12-07 DIAGNOSIS — E782 Mixed hyperlipidemia: Secondary | ICD-10-CM | POA: Diagnosis not present

## 2022-12-07 DIAGNOSIS — G894 Chronic pain syndrome: Secondary | ICD-10-CM | POA: Diagnosis not present

## 2022-12-07 MED ORDER — OZEMPIC (2 MG/DOSE) 8 MG/3ML ~~LOC~~ SOPN
2.0000 mg | PEN_INJECTOR | SUBCUTANEOUS | 0 refills | Status: DC
  Filled 2022-12-07: qty 3, 28d supply, fill #0

## 2022-12-21 DIAGNOSIS — I10 Essential (primary) hypertension: Secondary | ICD-10-CM | POA: Diagnosis not present

## 2022-12-21 DIAGNOSIS — E782 Mixed hyperlipidemia: Secondary | ICD-10-CM | POA: Diagnosis not present

## 2022-12-21 DIAGNOSIS — Z6841 Body Mass Index (BMI) 40.0 and over, adult: Secondary | ICD-10-CM | POA: Diagnosis not present

## 2022-12-21 DIAGNOSIS — E559 Vitamin D deficiency, unspecified: Secondary | ICD-10-CM | POA: Diagnosis not present

## 2022-12-21 DIAGNOSIS — G4733 Obstructive sleep apnea (adult) (pediatric): Secondary | ICD-10-CM | POA: Diagnosis not present

## 2022-12-21 DIAGNOSIS — E039 Hypothyroidism, unspecified: Secondary | ICD-10-CM | POA: Diagnosis not present

## 2022-12-21 DIAGNOSIS — F5081 Binge eating disorder: Secondary | ICD-10-CM | POA: Diagnosis not present

## 2022-12-21 DIAGNOSIS — R7303 Prediabetes: Secondary | ICD-10-CM | POA: Diagnosis not present

## 2022-12-21 DIAGNOSIS — G894 Chronic pain syndrome: Secondary | ICD-10-CM | POA: Diagnosis not present

## 2022-12-25 DIAGNOSIS — S81802A Unspecified open wound, left lower leg, initial encounter: Secondary | ICD-10-CM | POA: Diagnosis not present

## 2022-12-25 DIAGNOSIS — L03116 Cellulitis of left lower limb: Secondary | ICD-10-CM | POA: Diagnosis not present

## 2022-12-27 DIAGNOSIS — L03116 Cellulitis of left lower limb: Secondary | ICD-10-CM | POA: Diagnosis not present

## 2022-12-27 DIAGNOSIS — B379 Candidiasis, unspecified: Secondary | ICD-10-CM | POA: Diagnosis not present

## 2022-12-28 DIAGNOSIS — H52223 Regular astigmatism, bilateral: Secondary | ICD-10-CM | POA: Diagnosis not present

## 2022-12-28 DIAGNOSIS — H401131 Primary open-angle glaucoma, bilateral, mild stage: Secondary | ICD-10-CM | POA: Diagnosis not present

## 2022-12-28 DIAGNOSIS — H5231 Anisometropia: Secondary | ICD-10-CM | POA: Diagnosis not present

## 2022-12-28 DIAGNOSIS — Z9841 Cataract extraction status, right eye: Secondary | ICD-10-CM | POA: Diagnosis not present

## 2022-12-28 DIAGNOSIS — Z9842 Cataract extraction status, left eye: Secondary | ICD-10-CM | POA: Diagnosis not present

## 2022-12-28 DIAGNOSIS — H524 Presbyopia: Secondary | ICD-10-CM | POA: Diagnosis not present

## 2022-12-28 DIAGNOSIS — Z961 Presence of intraocular lens: Secondary | ICD-10-CM | POA: Diagnosis not present

## 2022-12-28 DIAGNOSIS — H47233 Glaucomatous optic atrophy, bilateral: Secondary | ICD-10-CM | POA: Diagnosis not present

## 2022-12-29 DIAGNOSIS — M1612 Unilateral primary osteoarthritis, left hip: Secondary | ICD-10-CM | POA: Diagnosis not present

## 2022-12-29 DIAGNOSIS — G5621 Lesion of ulnar nerve, right upper limb: Secondary | ICD-10-CM | POA: Diagnosis not present

## 2022-12-29 DIAGNOSIS — M7061 Trochanteric bursitis, right hip: Secondary | ICD-10-CM | POA: Diagnosis not present

## 2022-12-31 DIAGNOSIS — Z5189 Encounter for other specified aftercare: Secondary | ICD-10-CM | POA: Diagnosis not present

## 2022-12-31 DIAGNOSIS — L03116 Cellulitis of left lower limb: Secondary | ICD-10-CM | POA: Diagnosis not present

## 2023-01-03 DIAGNOSIS — M79601 Pain in right arm: Secondary | ICD-10-CM | POA: Diagnosis not present

## 2023-01-03 DIAGNOSIS — M79631 Pain in right forearm: Secondary | ICD-10-CM | POA: Diagnosis not present

## 2023-01-03 DIAGNOSIS — G5681 Other specified mononeuropathies of right upper limb: Secondary | ICD-10-CM | POA: Diagnosis not present

## 2023-01-03 DIAGNOSIS — S52131K Displaced fracture of neck of right radius, subsequent encounter for closed fracture with nonunion: Secondary | ICD-10-CM | POA: Diagnosis not present

## 2023-01-03 DIAGNOSIS — M25521 Pain in right elbow: Secondary | ICD-10-CM | POA: Diagnosis not present

## 2023-01-05 DIAGNOSIS — G894 Chronic pain syndrome: Secondary | ICD-10-CM | POA: Diagnosis not present

## 2023-01-05 DIAGNOSIS — G4733 Obstructive sleep apnea (adult) (pediatric): Secondary | ICD-10-CM | POA: Diagnosis not present

## 2023-01-05 DIAGNOSIS — E039 Hypothyroidism, unspecified: Secondary | ICD-10-CM | POA: Diagnosis not present

## 2023-01-05 DIAGNOSIS — Z9189 Other specified personal risk factors, not elsewhere classified: Secondary | ICD-10-CM | POA: Diagnosis not present

## 2023-01-05 DIAGNOSIS — E559 Vitamin D deficiency, unspecified: Secondary | ICD-10-CM | POA: Diagnosis not present

## 2023-01-05 DIAGNOSIS — I10 Essential (primary) hypertension: Secondary | ICD-10-CM | POA: Diagnosis not present

## 2023-01-05 DIAGNOSIS — Z6841 Body Mass Index (BMI) 40.0 and over, adult: Secondary | ICD-10-CM | POA: Diagnosis not present

## 2023-01-05 DIAGNOSIS — K5903 Drug induced constipation: Secondary | ICD-10-CM | POA: Diagnosis not present

## 2023-01-05 DIAGNOSIS — E782 Mixed hyperlipidemia: Secondary | ICD-10-CM | POA: Diagnosis not present

## 2023-01-05 DIAGNOSIS — F5081 Binge eating disorder: Secondary | ICD-10-CM | POA: Diagnosis not present

## 2023-01-05 DIAGNOSIS — R7303 Prediabetes: Secondary | ICD-10-CM | POA: Diagnosis not present

## 2023-01-12 DIAGNOSIS — M79601 Pain in right arm: Secondary | ICD-10-CM | POA: Diagnosis not present

## 2023-01-12 DIAGNOSIS — M7989 Other specified soft tissue disorders: Secondary | ICD-10-CM | POA: Diagnosis not present

## 2023-01-12 DIAGNOSIS — R936 Abnormal findings on diagnostic imaging of limbs: Secondary | ICD-10-CM | POA: Diagnosis not present

## 2023-01-12 DIAGNOSIS — R600 Localized edema: Secondary | ICD-10-CM | POA: Diagnosis not present

## 2023-01-13 DIAGNOSIS — M25552 Pain in left hip: Secondary | ICD-10-CM | POA: Diagnosis not present

## 2023-01-13 DIAGNOSIS — M7062 Trochanteric bursitis, left hip: Secondary | ICD-10-CM | POA: Diagnosis not present

## 2023-01-13 DIAGNOSIS — M1612 Unilateral primary osteoarthritis, left hip: Secondary | ICD-10-CM | POA: Diagnosis not present

## 2023-01-14 DIAGNOSIS — G5681 Other specified mononeuropathies of right upper limb: Secondary | ICD-10-CM | POA: Diagnosis not present

## 2023-01-17 DIAGNOSIS — M25521 Pain in right elbow: Secondary | ICD-10-CM | POA: Diagnosis not present

## 2023-01-17 DIAGNOSIS — G5681 Other specified mononeuropathies of right upper limb: Secondary | ICD-10-CM | POA: Diagnosis not present

## 2023-01-19 DIAGNOSIS — S52121A Displaced fracture of head of right radius, initial encounter for closed fracture: Secondary | ICD-10-CM | POA: Diagnosis not present

## 2023-01-19 DIAGNOSIS — M79631 Pain in right forearm: Secondary | ICD-10-CM | POA: Diagnosis not present

## 2023-01-19 DIAGNOSIS — M25421 Effusion, right elbow: Secondary | ICD-10-CM | POA: Diagnosis not present

## 2023-01-20 DIAGNOSIS — I10 Essential (primary) hypertension: Secondary | ICD-10-CM | POA: Diagnosis not present

## 2023-01-20 DIAGNOSIS — G4733 Obstructive sleep apnea (adult) (pediatric): Secondary | ICD-10-CM | POA: Diagnosis not present

## 2023-01-20 DIAGNOSIS — M170 Bilateral primary osteoarthritis of knee: Secondary | ICD-10-CM | POA: Diagnosis not present

## 2023-01-20 DIAGNOSIS — K5903 Drug induced constipation: Secondary | ICD-10-CM | POA: Diagnosis not present

## 2023-01-20 DIAGNOSIS — M25562 Pain in left knee: Secondary | ICD-10-CM | POA: Diagnosis not present

## 2023-01-20 DIAGNOSIS — M25561 Pain in right knee: Secondary | ICD-10-CM | POA: Diagnosis not present

## 2023-01-20 DIAGNOSIS — M1612 Unilateral primary osteoarthritis, left hip: Secondary | ICD-10-CM | POA: Diagnosis not present

## 2023-01-20 DIAGNOSIS — R7303 Prediabetes: Secondary | ICD-10-CM | POA: Diagnosis not present

## 2023-01-20 DIAGNOSIS — G894 Chronic pain syndrome: Secondary | ICD-10-CM | POA: Diagnosis not present

## 2023-01-20 DIAGNOSIS — E039 Hypothyroidism, unspecified: Secondary | ICD-10-CM | POA: Diagnosis not present

## 2023-01-20 DIAGNOSIS — Z6841 Body Mass Index (BMI) 40.0 and over, adult: Secondary | ICD-10-CM | POA: Diagnosis not present

## 2023-01-20 DIAGNOSIS — F5081 Binge eating disorder: Secondary | ICD-10-CM | POA: Diagnosis not present

## 2023-01-20 DIAGNOSIS — E559 Vitamin D deficiency, unspecified: Secondary | ICD-10-CM | POA: Diagnosis not present

## 2023-01-20 DIAGNOSIS — M25552 Pain in left hip: Secondary | ICD-10-CM | POA: Diagnosis not present

## 2023-01-20 DIAGNOSIS — E782 Mixed hyperlipidemia: Secondary | ICD-10-CM | POA: Diagnosis not present

## 2023-01-20 DIAGNOSIS — G8929 Other chronic pain: Secondary | ICD-10-CM | POA: Diagnosis not present

## 2023-01-24 DIAGNOSIS — E039 Hypothyroidism, unspecified: Secondary | ICD-10-CM | POA: Diagnosis not present

## 2023-01-24 DIAGNOSIS — E782 Mixed hyperlipidemia: Secondary | ICD-10-CM | POA: Diagnosis not present

## 2023-01-24 DIAGNOSIS — M5136 Other intervertebral disc degeneration, lumbar region: Secondary | ICD-10-CM | POA: Diagnosis not present

## 2023-01-24 DIAGNOSIS — I10 Essential (primary) hypertension: Secondary | ICD-10-CM | POA: Diagnosis not present

## 2023-01-24 DIAGNOSIS — Z6841 Body Mass Index (BMI) 40.0 and over, adult: Secondary | ICD-10-CM | POA: Diagnosis not present

## 2023-01-24 DIAGNOSIS — K219 Gastro-esophageal reflux disease without esophagitis: Secondary | ICD-10-CM | POA: Diagnosis not present

## 2023-01-24 DIAGNOSIS — R7303 Prediabetes: Secondary | ICD-10-CM | POA: Diagnosis not present

## 2023-01-24 DIAGNOSIS — D508 Other iron deficiency anemias: Secondary | ICD-10-CM | POA: Diagnosis not present

## 2023-01-24 DIAGNOSIS — Z79899 Other long term (current) drug therapy: Secondary | ICD-10-CM | POA: Diagnosis not present

## 2023-01-24 DIAGNOSIS — F331 Major depressive disorder, recurrent, moderate: Secondary | ICD-10-CM | POA: Diagnosis not present

## 2023-01-24 DIAGNOSIS — G2581 Restless legs syndrome: Secondary | ICD-10-CM | POA: Diagnosis not present

## 2023-01-31 DIAGNOSIS — G5681 Other specified mononeuropathies of right upper limb: Secondary | ICD-10-CM | POA: Diagnosis not present

## 2023-01-31 DIAGNOSIS — M25521 Pain in right elbow: Secondary | ICD-10-CM | POA: Diagnosis not present

## 2023-01-31 DIAGNOSIS — M25552 Pain in left hip: Secondary | ICD-10-CM | POA: Diagnosis not present

## 2023-01-31 DIAGNOSIS — M5136 Other intervertebral disc degeneration, lumbar region: Secondary | ICD-10-CM | POA: Diagnosis not present

## 2023-01-31 DIAGNOSIS — M5459 Other low back pain: Secondary | ICD-10-CM | POA: Diagnosis not present

## 2023-02-08 DIAGNOSIS — G629 Polyneuropathy, unspecified: Secondary | ICD-10-CM | POA: Diagnosis not present

## 2023-02-08 DIAGNOSIS — G5631 Lesion of radial nerve, right upper limb: Secondary | ICD-10-CM | POA: Diagnosis not present

## 2023-02-08 DIAGNOSIS — R29898 Other symptoms and signs involving the musculoskeletal system: Secondary | ICD-10-CM | POA: Diagnosis not present

## 2023-02-08 DIAGNOSIS — S52131K Displaced fracture of neck of right radius, subsequent encounter for closed fracture with nonunion: Secondary | ICD-10-CM | POA: Diagnosis not present

## 2023-02-15 NOTE — Progress Notes (Addendum)
COVID Vaccine Completed:  Date of COVID positive in last 90 days:  PCP - Adela Lank, NP Cardiologist - Belva Crome, MD LOV 08/05/22  Chest x-ray -  EKG -  Stress Test - 08/12/21 Epic ECHO - 07/14/21 Epic Cardiac Cath -  Pacemaker/ICD device last checked: Spinal Cord Stimulator:  Bowel Prep -   Sleep Study -  CPAP -   Fasting Blood Sugar - preDM Checks Blood Sugar _____ times a day  Last dose of GLP1 agonist-  Ozempic GLP1 instructions:     Last dose of SGLT-2 inhibitors-  N/A SGLT-2 instructions: N/A   Blood Thinner Instructions:  Time Aspirin Instructions: Last Dose:  Activity level:  Can go up a flight of stairs and perform activities of daily living without stopping and without symptoms of chest pain or shortness of breath.  Able to exercise without symptoms  Unable to go up a flight of stairs without symptoms of     Anesthesia review: Htn valvular heart disease, OSA  Patient denies shortness of breath, fever, cough and chest pain at PAT appointment  Patient verbalized understanding of instructions that were given to them at the PAT appointment. Patient was also instructed that they will need to review over the PAT instructions again at home before surgery.

## 2023-02-15 NOTE — Patient Instructions (Signed)
SURGICAL WAITING ROOM VISITATION  Patients having surgery or a procedure may have no more than 2 support people in the waiting area - these visitors may rotate.    Children under the age of 38 must have an adult with them who is not the patient.  Due to an increase in RSV and influenza rates and associated hospitalizations, children ages 40 and under may not visit patients in [facility] hospitals.  If the patient needs to stay at the hospital during part of their recovery, the visitor guidelines for inpatient rooms apply. Pre-op nurse will coordinate an appropriate time for 1 support person to accompany patient in pre-op.  This support person may not rotate.    Please refer to the [facility] website for the visitor guidelines for Inpatients (after your surgery is over and you are in a regular room).    Your procedure is scheduled on: 03/01/23   Report to [facility] Main Entrance    Report to admitting at 1:15 PM   Call this number if you have problems the morning of surgery [phone]   Do not eat food :After Midnight.   After Midnight you may have the following liquids until 12:45 PM DAY OF SURGERY  Water Non-Citrus Juices (without pulp, NO RED-Apple, White grape, White cranberry) Black Coffee (NO MILK/CREAM OR CREAMERS, sugar ok)  Clear Tea (NO MILK/CREAM OR CREAMERS, sugar ok) regular and decaf                             Plain Jell-O (NO RED)                                           Fruit ices (not with fruit pulp, NO RED)                                     Popsicles (NO RED)                                                               Sports drinks like Gatorade (NO RED)    The day of surgery:  Drink ONE (1) Pre-Surgery G2 at 12:45 PM the morning of surgery. Drink in one sitting. Do not sip.  This drink was given to you during your hospital  pre-op appointment visit. Nothing else to drink after completing the  Pre-Surgery G2.          If you have questions,  please contact your surgeon's office.   FOLLOW BOWEL PREP AND ANY ADDITIONAL PRE OP INSTRUCTIONS YOU RECEIVED FROM YOUR SURGEON'S OFFICE!!!     Oral Hygiene is also important to reduce your risk of infection.                                    Remember - BRUSH YOUR TEETH THE MORNING OF SURGERY WITH YOUR REGULAR TOOTHPASTE  DENTURES WILL BE REMOVED PRIOR TO SURGERY PLEASE DO NOT APPLY "Poly grip" OR ADHESIVES!!!  Take these medicines the morning of surgery with A SIP OF WATER: Xanax, Fluoxetine, Levothyroxine, Oxycodone   How to Manage Your Diabetes Before and After Surgery  Why is it important to control my blood sugar before and after surgery? Improving blood sugar levels before and after surgery helps healing and can limit problems. A way of improving blood sugar control is eating a healthy diet by:  Eating less sugar and carbohydrates  Increasing activity/exercise  Talking with your doctor about reaching your blood sugar goals High blood sugars (greater than 180 mg/dL) can raise your risk of infections and slow your recovery, so you will need to focus on controlling your diabetes during the weeks before surgery. Make sure that the doctor who takes care of your diabetes knows about your planned surgery including the date and location.  How do I manage my blood sugar before surgery? Check your blood sugar at least 4 times a day, starting 2 days before surgery, to make sure that the level is not too high or low. Check your blood sugar the morning of your surgery when you wake up and every 2 hours until you get to the Short Stay unit. If your blood sugar is less than 70 mg/dL, you will need to treat for low blood sugar: Do not take insulin. Treat a low blood sugar (less than 70 mg/dL) with  cup of clear juice (cranberry or apple), 4 glucose tablets, OR glucose gel. Recheck blood sugar in 15 minutes after treatment (to make sure it is greater than 70 mg/dL). If your blood sugar is not  greater than 70 mg/dL on recheck, call [phone] for further instructions. Report your blood sugar to the short stay nurse when you get to Short Stay.  If you are admitted to the hospital after surgery: Your blood sugar will be checked by the staff and you will probably be given insulin after surgery (instead of oral diabetes medicines) to make sure you have good blood sugar levels. The goal for blood sugar control after surgery is 80-180 mg/dL.   DO NOT TAKE THE FOLLOWING 7 DAYS PRIOR TO SURGERY: Ozempic, Wegovy, Rybelsus (Semaglutide), Byetta (exenatide), Bydureon (exenatide ER), Victoza, Saxenda (liraglutide), or Trulicity (dulaglutide) Mounjaro (Tirzepatide) Adlyxin (Lixisenatide), Polyethylene Glycol Loxenatide  DO NOT TAKE Ozempic 02/22/23.  Reviewed and Endorsed by [facility] Patient Education Committee, August 2015  Bring CPAP mask and tubing day of surgery.                              You may not have any metal on your body including hair pins, jewelry, and body piercing             Do not wear make-up, lotions, powders, perfumes, or deodorant  Do not wear nail polish including gel and S&S, artificial/acrylic nails, or any other type of covering on natural nails including finger and toenails. If you have artificial nails, gel coating, etc. that needs to be removed by a nail salon please have this removed prior to surgery or surgery may need to be canceled/ delayed if the surgeon/ anesthesia feels like they are unable to be safely monitored.   Do not shave  48 hours prior to surgery.    Do not bring valuables to the hospital. [facility] IS NOT             RESPONSIBLE   FOR VALUABLES.   Contacts, glasses, dentures or bridgework may not  be worn into surgery.   Bring small overnight bag day of surgery.   DO NOT BRING YOUR HOME MEDICATIONS TO THE HOSPITAL. PHARMACY WILL DISPENSE MEDICATIONS LISTED ON YOUR MEDICATION LIST TO YOU DURING YOUR ADMISSION IN THE HOSPITAL!               Please read over the following fact sheets you were given: IF YOU HAVE QUESTIONS ABOUT YOUR PRE-OP INSTRUCTIONS PLEASE CALL [phone] [place]ras    If you received a COVID test during your pre-op visit  it is requested that you wear a mask when out in public, stay away from anyone that may not be feeling well and notify your surgeon if you develop symptoms. If you test positive for Covid or have been in contact with anyone that has tested positive in the last 10 days please notify you surgeon.      Pre-operative 5 CHG Bath Instructions   You can play a key role in reducing the risk of infection after surgery. Your skin needs to be as free of germs as possible. You can reduce the number of germs on your skin by washing with CHG (chlorhexidine gluconate) soap before surgery. CHG is an antiseptic soap that kills germs and continues to kill germs even after washing.   DO NOT use if you have an allergy to chlorhexidine/CHG or antibacterial soaps. If your skin becomes reddened or irritated, stop using the CHG and notify one of our RNs at [phone].   Please shower with the CHG soap starting 4 days before surgery using the following schedule:     Please keep in mind the following:  DO NOT shave, including legs and underarms, starting the day of your first shower.   You may shave your face at any point before/day of surgery.  Place clean sheets on your bed the day you start using CHG soap. Use a clean washcloth (not used since being washed) for each shower. DO NOT sleep with pets once you start using the CHG.   CHG Shower Instructions:  If you choose to wash your hair and private area, wash first with your normal shampoo/soap.  After you use shampoo/soap, rinse your hair and body thoroughly to remove shampoo/soap residue.  Turn the water OFF and apply about 3 tablespoons (45 ml) of CHG soap to a CLEAN washcloth.  Apply CHG soap ONLY FROM YOUR NECK DOWN TO YOUR TOES (washing for 3-5  minutes)  DO NOT use CHG soap on face, private areas, open wounds, or sores.  Pay special attention to the area where your surgery is being performed.  If you are having back surgery, having someone wash your back for you may be helpful. Wait 2 minutes after CHG soap is applied, then you may rinse off the CHG soap.  Pat dry with a clean towel  Put on clean clothes/pajamas   If you choose to wear lotion, please use ONLY the CHG-compatible lotions on the back of this paper.     Additional instructions for the day of surgery: DO NOT APPLY any lotions, deodorants, cologne, or perfumes.   Put on clean/comfortable clothes.  Brush your teeth.  Ask your nurse before applying any prescription medications to the skin.      CHG Compatible Lotions   Aveeno Moisturizing lotion  Cetaphil Moisturizing Cream  Cetaphil Moisturizing Lotion  Clairol Herbal Essence Moisturizing Lotion, Dry Skin  Clairol Herbal Essence Moisturizing Lotion, Extra Dry Skin  Clairol Herbal Essence Moisturizing Lotion, Normal Skin  Curel  Age Defying Therapeutic Moisturizing Lotion with Alpha Hydroxy  Curel Extreme Care Body Lotion  Curel Soothing Hands Moisturizing Hand Lotion  Curel Therapeutic Moisturizing Cream, Fragrance-Free  Curel Therapeutic Moisturizing Lotion, Fragrance-Free  Curel Therapeutic Moisturizing Lotion, Original Formula  Eucerin Daily Replenishing Lotion  Eucerin Dry Skin Therapy Plus Alpha Hydroxy Crme  Eucerin Dry Skin Therapy Plus Alpha Hydroxy Lotion  Eucerin Original Crme  Eucerin Original Lotion  Eucerin Plus Crme Eucerin Plus Lotion  Eucerin TriLipid Replenishing Lotion  Keri Anti-Bacterial Hand Lotion  Keri Deep Conditioning Original Lotion Dry Skin Formula Softly Scented  Keri Deep Conditioning Original Lotion, Fragrance Free Sensitive Skin Formula  Keri Lotion Fast Absorbing Fragrance Free Sensitive Skin Formula  Keri Lotion Fast Absorbing Softly Scented Dry Skin Formula  Keri  Original Lotion  Keri Skin Renewal Lotion Keri Silky Smooth Lotion  Keri Silky Smooth Sensitive Skin Lotion  Nivea Body Creamy Conditioning Oil  Nivea Body Extra Enriched Lotion  Nivea Body Original Lotion  Nivea Body Sheer Moisturizing Lotion Nivea Crme  Nivea Skin Firming Lotion  NutraDerm 30 Skin Lotion  NutraDerm Skin Lotion  NutraDerm Therapeutic Skin Cream  NutraDerm Therapeutic Skin Lotion  ProShield Protective Hand Cream  Provon moisturizing lotion   Incentive Spirometer  An incentive spirometer is a tool that can help keep your lungs clear and active. This tool measures how well you are filling your lungs with each breath. Taking long deep breaths may help reverse or decrease the chance of developing breathing (pulmonary) problems (especially infection) following: A long period of time when you are unable to move or be active. BEFORE THE PROCEDURE  If the spirometer includes an indicator to show your best effort, your nurse or respiratory therapist will set it to a desired goal. If possible, sit up straight or lean slightly forward. Try not to slouch. Hold the incentive spirometer in an upright position. INSTRUCTIONS FOR USE  Sit on the edge of your bed if possible, or sit up as far as you can in bed or on a chair. Hold the incentive spirometer in an upright position. Breathe out normally. Place the mouthpiece in your mouth and seal your lips tightly around it. Breathe in slowly and as deeply as possible, raising the piston or the ball toward the top of the column. Hold your breath for 3-5 seconds or for as long as possible. Allow the piston or ball to fall to the bottom of the column. Remove the mouthpiece from your mouth and breathe out normally. Rest for a few seconds and repeat Steps 1 through 7 at least 10 times every 1-2 hours when you are awake. Take your time and take a few normal breaths between deep breaths. The spirometer may include an indicator to show your best  effort. Use the indicator as a goal to work toward during each repetition. After each set of 10 deep breaths, practice coughing to be sure your lungs are clear. If you have an incision (the cut made at the time of surgery), support your incision when coughing by placing a pillow or rolled up towels firmly against it. Once you are able to get out of bed, walk around indoors and cough well. You may stop using the incentive spirometer when instructed by your caregiver.  RISKS AND COMPLICATIONS Take your time so you do not get dizzy or light-headed. If you are in pain, you may need to take or ask for pain medication before doing incentive spirometry. It is harder to take a deep  breath if you are having pain. AFTER USE Rest and breathe slowly and easily. It can be helpful to keep track of a log of your progress. Your caregiver can provide you with a simple table to help with this. If you are using the spirometer at home, follow these instructions: SEEK MEDICAL CARE IF:  You are having difficultly using the spirometer. You have trouble using the spirometer as often as instructed. Your pain medication is not giving enough relief while using the spirometer. You develop fever of 100.5 F (38.1 C) or higher. SEEK IMMEDIATE MEDICAL CARE IF:  You cough up bloody sputum that had not been present before. You develop fever of 102 F (38.9 C) or greater. You develop worsening pain at or near the incision site. MAKE SURE YOU:  Understand these instructions. Will watch your condition. Will get help right away if you are not doing well or get worse. Document Released: 11/22/2006 Document Revised: 10/04/2011 Document Reviewed: 01/23/2007 Exit[employer] Patient Information 2014 Exit[employer], [place].   ________________________________________________________________________ WHAT IS A BLOOD TRANSFUSION? Blood Transfusion Information  A transfusion is the replacement of blood or some of its parts. Blood is made up of  multiple cells which provide different functions. Red blood cells carry oxygen and are used for blood loss replacement. White blood cells fight against infection. Platelets control bleeding. Plasma helps clot blood. Other blood products are available for specialized needs, such as hemophilia or other clotting disorders. BEFORE THE TRANSFUSION  Who gives blood for transfusions?  Healthy volunteers who are fully evaluated to make sure their blood is safe. This is blood bank blood. Transfusion therapy is the safest it has ever been in the practice of medicine. Before blood is taken from a donor, a complete history is taken to make sure that person has no history of diseases nor engages in risky social behavior (examples are intravenous drug use or sexual activity with multiple partners). The donor's travel history is screened to minimize risk of transmitting infections, such as malaria. The donated blood is tested for signs of infectious diseases, such as HIV and hepatitis. The blood is then tested to be sure it is compatible with you in order to minimize the chance of a transfusion reaction. If you or a relative donates blood, this is often done in anticipation of surgery and is not appropriate for emergency situations. It takes many days to process the donated blood. RISKS AND COMPLICATIONS Although transfusion therapy is very safe and saves many lives, the main dangers of transfusion include:  Getting an infectious disease. Developing a transfusion reaction. This is an allergic reaction to something in the blood you were given. Every precaution is taken to prevent this. The decision to have a blood transfusion has been considered carefully by your caregiver before blood is given. Blood is not given unless the benefits outweigh the risks. AFTER THE TRANSFUSION Right after receiving a blood transfusion, you will usually feel much better and more energetic. This is especially true if your red blood  cells have gotten low (anemic). The transfusion raises the level of the red blood cells which carry oxygen, and this usually causes an energy increase. The nurse administering the transfusion will monitor you carefully for complications. HOME CARE INSTRUCTIONS  No special instructions are needed after a transfusion. You may find your energy is better. Speak with your caregiver about any limitations on activity for underlying diseases you may have. SEEK MEDICAL CARE IF:  Your condition is not improving after your transfusion. You  develop redness or irritation at the intravenous (IV) site. SEEK IMMEDIATE MEDICAL CARE IF:  Any of the following symptoms occur over the next 12 hours: Shaking chills. You have a temperature by mouth above 102 F (38.9 C), not controlled by medicine. Chest, back, or muscle pain. People around you feel you are not acting correctly or are confused. Shortness of breath or difficulty breathing. Dizziness and fainting. You get a rash or develop hives. You have a decrease in urine output. Your urine turns a dark color or changes to pink, red, or brown. Any of the following symptoms occur over the next 10 days: You have a temperature by mouth above 102 F (38.9 C), not controlled by medicine. Shortness of breath. Weakness after normal activity. The white part of the eye turns yellow (jaundice). You have a decrease in the amount of urine or are urinating less often. Your urine turns a dark color or changes to pink, red, or brown. Document Released: 07/09/2000 Document Revised: 10/04/2011 Document Reviewed: 02/26/2008 [facility] Patient Information 2014 [place], [place].  _______________________________________________________________________

## 2023-02-16 ENCOUNTER — Encounter (HOSPITAL_COMMUNITY)
Admission: RE | Admit: 2023-02-16 | Discharge: 2023-02-16 | Disposition: A | Discharge status: Home / Self Care | Payer: Medicare Other | Source: Ambulatory Visit | Attending: Orthopedic Surgery | Admitting: Orthopedic Surgery

## 2023-02-16 ENCOUNTER — Other Ambulatory Visit: Payer: Self-pay

## 2023-02-16 ENCOUNTER — Encounter (HOSPITAL_COMMUNITY): Payer: Self-pay

## 2023-02-16 VITALS — BP 142/80 | HR 72 | Temp 98.4°F | Resp 16 | Ht 68.0 in | Wt 250.0 lb

## 2023-02-16 DIAGNOSIS — I10 Essential (primary) hypertension: Secondary | ICD-10-CM | POA: Insufficient documentation

## 2023-02-16 DIAGNOSIS — R9431 Abnormal electrocardiogram [ECG] [EKG]: Secondary | ICD-10-CM | POA: Diagnosis not present

## 2023-02-16 DIAGNOSIS — Z01818 Encounter for other preprocedural examination: Secondary | ICD-10-CM

## 2023-02-16 DIAGNOSIS — Z01810 Encounter for preprocedural cardiovascular examination: Secondary | ICD-10-CM | POA: Insufficient documentation

## 2023-02-16 DIAGNOSIS — Z01812 Encounter for preprocedural laboratory examination: Secondary | ICD-10-CM | POA: Insufficient documentation

## 2023-02-16 HISTORY — DX: Personal history of urinary calculi: Z87.442

## 2023-02-16 HISTORY — DX: Other specified postprocedural states: Z98.890

## 2023-02-16 LAB — CBC
HCT: 39.5 % (ref 36.0–46.0)
Hemoglobin: 13.1 g/dL (ref 12.0–15.0)
MCH: 31.4 pg (ref 26.0–34.0)
MCHC: 33.2 g/dL (ref 30.0–36.0)
MCV: 94.7 fL (ref 80.0–100.0)
Platelets: 272 10*3/uL (ref 150–400)
RBC: 4.17 MIL/uL (ref 3.87–5.11)
RDW: 13.4 % (ref 11.5–15.5)
WBC: 7.9 10*3/uL (ref 4.0–10.5)
nRBC: 0 % (ref 0.0–0.2)

## 2023-02-16 LAB — TYPE AND SCREEN
ABO/RH(D): O POS
Antibody Screen: NEGATIVE

## 2023-02-16 LAB — BASIC METABOLIC PANEL
Anion gap: 11 (ref 5–15)
BUN: 14 mg/dL (ref 8–23)
CO2: 25 mmol/L (ref 22–32)
Calcium: 9.1 mg/dL (ref 8.9–10.3)
Chloride: 98 mmol/L (ref 98–111)
Creatinine, Ser: 0.57 mg/dL (ref 0.44–1.00)
GFR, Estimated: >60 mL/min (ref >60)
Glucose, Bld: 91 mg/dL (ref 70–99)
Potassium: 3.5 mmol/L (ref 3.5–5.1)
Sodium: 134 mmol/L — ABNORMAL LOW (ref 135–145)

## 2023-02-16 LAB — SURGICAL PCR SCREEN
MRSA, PCR: NEGATIVE
Staphylococcus aureus: NEGATIVE

## 2023-02-22 NOTE — Progress Notes (Signed)
Anesthesia Chart Review   Case: [id number] Date/Time: 03/01/23 1528   Procedure: TOTAL HIP ARTHROPLASTY ANTERIOR APPROACH (Left: Hip)   Anesthesia type: Spinal   Pre-op diagnosis: Left hip osteoarthritis   Location: [facility] ROOM 09 / [facility]   Surgeons: Durene Romans, MD       DISCUSSION:77 y.o. former smoker with h/o PONV, HTN, sleep apnea on CPAP, left hip OA scheduled for above procedure 03/01/2023 with Dr. Durene Romans.   Pt last seen by cardiology 08/05/2022. Per exam pt with 2/6 systolic murmur. Last Echo 06/2021 no valvular problems.  VS: BP (!) 142/80   Pulse 72   Temp 36.9 C (Oral)   Resp 16   Ht 5\' 8"  (1.727 m)   Wt 113.4 kg   SpO2 96%   BMI 38.01 kg/m   PROVIDERS: Brown-Patram, Servando Salina, NP is PCP   Revankar, Aundra Dubin, MD is Cardiologist  LABS: Labs reviewed: Acceptable for surgery. (all labs ordered are listed, but only abnormal results are displayed)  Labs Reviewed  BASIC METABOLIC PANEL - Abnormal; Notable for the following components:      Result Value   Sodium 134 (*)    All other components within normal limits  SURGICAL PCR SCREEN  CBC  TYPE AND SCREEN     IMAGES:   EKG:   CV: Myocardial Perfusion 08/12/2021   The study is normal.   Left ventricular function is normal. Nuclear stress EF: 61 %. The left ventricular ejection fraction is normal (55-65%). End diastolic cavity size is normal.   Prior study not available for comparison.  Echo 07/14/2021 There is borderline concentric left ventricular hypertrophy There is normal global left ventricular contractility Overall left ventricular systolic function is normal with, an EF between 60-65% The diastolic filling pattern indicates impaired relaxation The inferior vena cava is dilated with no significant inspiratory collapse which is consistent estimated right atrial pressure of 15 mmHg Past Medical History:  Diagnosis Date   Anemia    Atrophic vaginitis 12/26/2019   Formatting of this note  might be different from the original. Follows along with urology uses bioidentical hormone   Benign essential hypertension 10/08/2015   Chest pain in adult 07/02/2015   Formatting of this note might be different from the original. 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   Hypertension    Insomnia 11/27/2013   Iron deficiency 12/30/2020   Iron deficiency anemia 12/29/2020   Lumbar spondylosis 05/21/2020   Malaise and fatigue 05/25/2018   Mixed hyperlipidemia    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 L3/4 7.2 mm with L4/5 mod  to severe spinal stenosis, L5/S1 with slight disc on the right Formatting of this note might be different from the original. Overview:  L3/4 7.2 mm with L4/5 mod to severe spinal stenosis, L5/S1 with slight disc on the right   Valvular heart disease 07/15/2021    Past Surgical History:  Procedure Laterality Date   ABDOMINAL HYSTERECTOMY     partial   CATARACT EXTRACTION Bilateral    CESAREAN SECTION     x2   RHINOPLASTY     SPINAL CORD STIMULATOR INSERTION N/A 12/11/2020   Procedure: SPINAL CORD STIMULATOR INSERTION;  Surgeon: Venita Lick, MD;  Location: [facility] OR;   Service: Orthopedics;  Laterality: N/A;   TONSILLECTOMY      MEDICATIONS:  ALPRAZolam (XANAX) 0.5 MG tablet   celecoxib (CELEBREX) 200 MG capsule   cholecalciferol (VITAMIN D3) 25 MCG (1000 UNIT) tablet   doxycycline (VIBRAMYCIN) 100 MG capsule   EPINEPHrine 0.3 mg/0.3 mL IJ SOAJ injection   ferrous sulfate 325 (65 FE) MG EC tablet   FLUoxetine (PROZAC) 20 MG capsule   furosemide (LASIX) 40 MG tablet   latanoprost (XALATAN) 0.005 % ophthalmic solution   levothyroxine (SYNTHROID) 88 MCG tablet   omeprazole (PRILOSEC) 20 MG capsule   oxyCODONE (OXY IR/ROXICODONE) 5 MG immediate release tablet   pramipexole (MIRAPEX) 1 MG tablet   Semaglutide, 2 MG/DOSE, (OZEMPIC, 2 MG/DOSE,) 8 MG/3ML SOPN   SUMAtriptan (IMITREX) 100 MG tablet   No current facility-administered medications for this encounter.   Jodell Cipro Ward, PA-C [facility] Pre-Surgical Testing [phone]

## 2023-02-23 DIAGNOSIS — M47896 Other spondylosis, lumbar region: Secondary | ICD-10-CM | POA: Diagnosis not present

## 2023-02-23 DIAGNOSIS — S52131K Displaced fracture of neck of right radius, subsequent encounter for closed fracture with nonunion: Secondary | ICD-10-CM | POA: Diagnosis not present

## 2023-02-23 DIAGNOSIS — Z4789 Encounter for other orthopedic aftercare: Secondary | ICD-10-CM | POA: Diagnosis not present

## 2023-02-23 DIAGNOSIS — Z4889 Encounter for other specified surgical aftercare: Secondary | ICD-10-CM | POA: Diagnosis not present

## 2023-02-23 DIAGNOSIS — M48061 Spinal stenosis, lumbar region without neurogenic claudication: Secondary | ICD-10-CM | POA: Diagnosis not present

## 2023-02-23 DIAGNOSIS — M5136 Other intervertebral disc degeneration, lumbar region: Secondary | ICD-10-CM | POA: Diagnosis not present

## 2023-03-01 ENCOUNTER — Encounter (HOSPITAL_COMMUNITY)
Admission: RE | Disposition: A | Discharge status: Home / Self Care | Payer: Self-pay | Source: Ambulatory Visit | Attending: Orthopedic Surgery

## 2023-03-01 ENCOUNTER — Observation Stay (HOSPITAL_COMMUNITY): Payer: Medicare Other

## 2023-03-01 ENCOUNTER — Encounter (HOSPITAL_COMMUNITY): Payer: Self-pay | Admitting: Orthopedic Surgery

## 2023-03-01 ENCOUNTER — Other Ambulatory Visit: Payer: Self-pay

## 2023-03-01 ENCOUNTER — Ambulatory Visit (HOSPITAL_BASED_OUTPATIENT_CLINIC_OR_DEPARTMENT_OTHER): Payer: Medicare Other | Admitting: Anesthesiology

## 2023-03-01 ENCOUNTER — Ambulatory Visit (HOSPITAL_COMMUNITY): Payer: Medicare Other | Admitting: Physician Assistant

## 2023-03-01 ENCOUNTER — Ambulatory Visit (HOSPITAL_COMMUNITY): Payer: Medicare Other

## 2023-03-01 ENCOUNTER — Observation Stay (HOSPITAL_COMMUNITY)
Admission: RE | Admit: 2023-03-01 | Discharge: 2023-03-02 | Disposition: A | Discharge status: Home / Self Care | Payer: Medicare Other | Source: Ambulatory Visit | Attending: Orthopedic Surgery | Admitting: Orthopedic Surgery

## 2023-03-01 DIAGNOSIS — Z87891 Personal history of nicotine dependence: Secondary | ICD-10-CM | POA: Diagnosis not present

## 2023-03-01 DIAGNOSIS — E782 Mixed hyperlipidemia: Secondary | ICD-10-CM | POA: Diagnosis not present

## 2023-03-01 DIAGNOSIS — M1612 Unilateral primary osteoarthritis, left hip: Principal | ICD-10-CM

## 2023-03-01 DIAGNOSIS — Z8616 Personal history of COVID-19: Secondary | ICD-10-CM | POA: Diagnosis not present

## 2023-03-01 DIAGNOSIS — Z79899 Other long term (current) drug therapy: Secondary | ICD-10-CM | POA: Insufficient documentation

## 2023-03-01 DIAGNOSIS — I10 Essential (primary) hypertension: Secondary | ICD-10-CM | POA: Diagnosis not present

## 2023-03-01 DIAGNOSIS — D509 Iron deficiency anemia, unspecified: Secondary | ICD-10-CM | POA: Diagnosis not present

## 2023-03-01 DIAGNOSIS — M1632 Unilateral osteoarthritis resulting from hip dysplasia, left hip: Secondary | ICD-10-CM | POA: Diagnosis not present

## 2023-03-01 DIAGNOSIS — E039 Hypothyroidism, unspecified: Secondary | ICD-10-CM | POA: Diagnosis not present

## 2023-03-01 DIAGNOSIS — Z471 Aftercare following joint replacement surgery: Secondary | ICD-10-CM | POA: Diagnosis not present

## 2023-03-01 DIAGNOSIS — Z96642 Presence of left artificial hip joint: Secondary | ICD-10-CM | POA: Diagnosis not present

## 2023-03-01 HISTORY — PX: TOTAL HIP ARTHROPLASTY: SHX124

## 2023-03-01 LAB — TYPE AND SCREEN
ABO/RH(D): O POS
Antibody Screen: NEGATIVE

## 2023-03-01 SURGERY — ARTHROPLASTY, HIP, TOTAL, ANTERIOR APPROACH
Anesthesia: General | Site: Hip | Laterality: Left

## 2023-03-01 MED ORDER — MENTHOL 3 MG MT LOZG: 1.0000 | LOZENGE | OROMUCOSAL | Status: DC | PRN

## 2023-03-01 MED ORDER — ACETAMINOPHEN 500 MG PO TABS
1000.0000 mg | ORAL_TABLET | Freq: Four times a day (QID) | ORAL | Status: DC
  Administered 2023-03-02 (×3): 1000 mg via ORAL
  Filled 2023-03-01 (×3): qty 2

## 2023-03-01 MED ORDER — SODIUM CHLORIDE 0.9 % IV SOLN: INTRAVENOUS | Status: DC

## 2023-03-01 MED ORDER — ALBUMIN HUMAN 5 % IV SOLN
INTRAVENOUS | Status: AC
  Filled 2023-03-01: qty 250

## 2023-03-01 MED ORDER — ORAL CARE MOUTH RINSE: 15.0000 mL | Freq: Once | OROMUCOSAL | Status: AC

## 2023-03-01 MED ORDER — VANCOMYCIN HCL IN DEXTROSE 1-5 GM/200ML-% IV SOLN
1000.0000 mg | INTRAVENOUS | Status: AC
  Administered 2023-03-01: 1000 mg via INTRAVENOUS
  Filled 2023-03-01: qty 200

## 2023-03-01 MED ORDER — LABETALOL HCL 5 MG/ML IV SOLN
INTRAVENOUS | Status: DC | PRN
Start: 2023-03-01 — End: 2023-03-01
  Administered 2023-03-01: 5 mg via INTRAVENOUS

## 2023-03-01 MED ORDER — LABETALOL HCL 5 MG/ML IV SOLN
INTRAVENOUS | Status: AC
  Filled 2023-03-01: qty 4

## 2023-03-01 MED ORDER — SUGAMMADEX SODIUM 200 MG/2ML IV SOLN
INTRAVENOUS | Status: DC | PRN
Start: 2023-03-01 — End: 2023-03-01
  Administered 2023-03-01: 200 mg via INTRAVENOUS

## 2023-03-01 MED ORDER — DIPHENHYDRAMINE HCL 12.5 MG/5ML PO ELIX
12.5000 mg | ORAL_SOLUTION | ORAL | Status: DC | PRN
  Administered 2023-03-02: 25 mg via ORAL
  Filled 2023-03-01: qty 10

## 2023-03-01 MED ORDER — FENTANYL CITRATE (PF) 100 MCG/2ML IJ SOLN
INTRAMUSCULAR | Status: AC
  Filled 2023-03-01: qty 2

## 2023-03-01 MED ORDER — SUMATRIPTAN SUCCINATE 50 MG PO TABS: 100.0000 mg | ORAL_TABLET | ORAL | Status: DC | PRN

## 2023-03-01 MED ORDER — KETOROLAC TROMETHAMINE 30 MG/ML IJ SOLN
INTRAMUSCULAR | Status: AC
  Filled 2023-03-01: qty 1

## 2023-03-01 MED ORDER — ACETAMINOPHEN 160 MG/5ML PO SOLN: 325.0000 mg | Freq: Once | ORAL | Status: DC | PRN

## 2023-03-01 MED ORDER — METOCLOPRAMIDE HCL 5 MG PO TABS: 5.0000 mg | ORAL_TABLET | Freq: Three times a day (TID) | ORAL | Status: DC | PRN

## 2023-03-01 MED ORDER — LIDOCAINE HCL (PF) 2 % IJ SOLN
INTRAMUSCULAR | Status: AC
  Filled 2023-03-01: qty 5

## 2023-03-01 MED ORDER — FENTANYL CITRATE PF 50 MCG/ML IJ SOSY
PREFILLED_SYRINGE | INTRAMUSCULAR | Status: AC
  Administered 2023-03-01: 50 ug via INTRAVENOUS
  Filled 2023-03-01: qty 1

## 2023-03-01 MED ORDER — OXYCODONE HCL 5 MG PO TABS
10.0000 mg | ORAL_TABLET | ORAL | Status: DC | PRN
  Administered 2023-03-01 – 2023-03-02 (×3): 15 mg via ORAL
  Filled 2023-03-01 (×2): qty 3

## 2023-03-01 MED ORDER — ACETAMINOPHEN 10 MG/ML IV SOLN
1000.0000 mg | Freq: Once | INTRAVENOUS | Status: DC | PRN
  Administered 2023-03-01: 1000 mg via INTRAVENOUS

## 2023-03-01 MED ORDER — ASPIRIN 81 MG PO CHEW
81.0000 mg | CHEWABLE_TABLET | Freq: Two times a day (BID) | ORAL | Status: DC
  Administered 2023-03-01 – 2023-03-02 (×2): 81 mg via ORAL
  Filled 2023-03-01 (×2): qty 1

## 2023-03-01 MED ORDER — HYDROMORPHONE HCL 2 MG/ML IJ SOLN
INTRAMUSCULAR | Status: AC
  Filled 2023-03-01: qty 1

## 2023-03-01 MED ORDER — POVIDONE-IODINE 10 % EX SWAB: 2.0000 | Freq: Once | CUTANEOUS | Status: DC

## 2023-03-01 MED ORDER — TRANEXAMIC ACID-NACL 1000-0.7 MG/100ML-% IV SOLN
1000.0000 mg | INTRAVENOUS | Status: AC
  Administered 2023-03-01: 1000 mg via INTRAVENOUS
  Filled 2023-03-01: qty 100

## 2023-03-01 MED ORDER — DEXAMETHASONE SODIUM PHOSPHATE 10 MG/ML IJ SOLN
INTRAMUSCULAR | Status: AC
  Filled 2023-03-01: qty 1

## 2023-03-01 MED ORDER — HYDROMORPHONE HCL 1 MG/ML IJ SOLN
0.5000 mg | INTRAMUSCULAR | Status: DC | PRN
  Administered 2023-03-01 – 2023-03-02 (×2): 1 mg via INTRAVENOUS
  Filled 2023-03-01 (×2): qty 1

## 2023-03-01 MED ORDER — POLYETHYLENE GLYCOL 3350 17 G PO PACK
17.0000 g | PACK | Freq: Two times a day (BID) | ORAL | Status: DC
  Administered 2023-03-01 – 2023-03-02 (×2): 17 g via ORAL
  Filled 2023-03-01 (×2): qty 1

## 2023-03-01 MED ORDER — OXYCODONE HCL 5 MG PO TABS
ORAL_TABLET | ORAL | Status: AC
  Filled 2023-03-01: qty 3

## 2023-03-01 MED ORDER — ROCURONIUM BROMIDE 100 MG/10ML IV SOLN
INTRAVENOUS | Status: DC | PRN
  Administered 2023-03-01: 60 mg via INTRAVENOUS

## 2023-03-01 MED ORDER — PANTOPRAZOLE SODIUM 40 MG PO TBEC
40.0000 mg | DELAYED_RELEASE_TABLET | Freq: Every day | ORAL | Status: DC
  Administered 2023-03-02: 40 mg via ORAL
  Filled 2023-03-01: qty 1

## 2023-03-01 MED ORDER — HYDROMORPHONE HCL 1 MG/ML IJ SOLN
INTRAMUSCULAR | Status: AC
  Administered 2023-03-01: 0.5 mg via INTRAVENOUS
  Filled 2023-03-01: qty 2

## 2023-03-01 MED ORDER — SODIUM CHLORIDE (PF) 0.9 % IJ SOLN
INTRAMUSCULAR | Status: DC | PRN
  Administered 2023-03-01: 30 mL

## 2023-03-01 MED ORDER — FENTANYL CITRATE PF 50 MCG/ML IJ SOSY: 50.0000 ug | PREFILLED_SYRINGE | Freq: Once | INTRAMUSCULAR | Status: AC

## 2023-03-01 MED ORDER — METOCLOPRAMIDE HCL 5 MG/ML IJ SOLN: 5.0000 mg | Freq: Three times a day (TID) | INTRAMUSCULAR | Status: DC | PRN

## 2023-03-01 MED ORDER — LACTATED RINGERS IV SOLN: INTRAVENOUS | Status: DC

## 2023-03-01 MED ORDER — FERROUS SULFATE 325 (65 FE) MG PO TABS
325.0000 mg | ORAL_TABLET | Freq: Every day | ORAL | Status: DC
  Administered 2023-03-02: 325 mg via ORAL
  Filled 2023-03-01: qty 1

## 2023-03-01 MED ORDER — CHLORHEXIDINE GLUCONATE 0.12 % MT SOLN
15.0000 mL | Freq: Once | OROMUCOSAL | Status: AC
  Administered 2023-03-01: 15 mL via OROMUCOSAL

## 2023-03-01 MED ORDER — METHOCARBAMOL 500 MG PO TABS
500.0000 mg | ORAL_TABLET | Freq: Four times a day (QID) | ORAL | Status: DC | PRN
  Administered 2023-03-01: 500 mg via ORAL
  Filled 2023-03-01 (×2): qty 1

## 2023-03-01 MED ORDER — EPHEDRINE SULFATE (PRESSORS) 50 MG/ML IJ SOLN
INTRAMUSCULAR | Status: DC | PRN
  Administered 2023-03-01 (×2): 7.5 mg via INTRAVENOUS

## 2023-03-01 MED ORDER — LATANOPROST 0.005 % OP SOLN
1.0000 [drp] | Freq: Every day | OPHTHALMIC | Status: DC
  Administered 2023-03-01: 1 [drp] via OPHTHALMIC
  Filled 2023-03-01: qty 2.5

## 2023-03-01 MED ORDER — TRANEXAMIC ACID-NACL 1000-0.7 MG/100ML-% IV SOLN
1000.0000 mg | Freq: Once | INTRAVENOUS | Status: AC
  Administered 2023-03-01: 1000 mg via INTRAVENOUS
  Filled 2023-03-01: qty 100

## 2023-03-01 MED ORDER — DEXAMETHASONE SODIUM PHOSPHATE 10 MG/ML IJ SOLN
8.0000 mg | Freq: Once | INTRAMUSCULAR | Status: AC
  Administered 2023-03-01: 8 mg via INTRAVENOUS

## 2023-03-01 MED ORDER — ACETAMINOPHEN 325 MG PO TABS: 325.0000 mg | ORAL_TABLET | Freq: Four times a day (QID) | ORAL | Status: DC | PRN

## 2023-03-01 MED ORDER — SODIUM CHLORIDE (PF) 0.9 % IJ SOLN
INTRAMUSCULAR | Status: AC
  Filled 2023-03-01: qty 50

## 2023-03-01 MED ORDER — BUPIVACAINE-EPINEPHRINE (PF) 0.25% -1:200000 IJ SOLN
INTRAMUSCULAR | Status: DC | PRN
  Administered 2023-03-01: 30 mL

## 2023-03-01 MED ORDER — EPINEPHRINE 0.3 MG/0.3ML IJ SOAJ: 0.3000 mg | INTRAMUSCULAR | Status: DC | PRN

## 2023-03-01 MED ORDER — ACETAMINOPHEN 325 MG PO TABS: 325.0000 mg | ORAL_TABLET | Freq: Once | ORAL | Status: DC | PRN

## 2023-03-01 MED ORDER — DOCUSATE SODIUM 100 MG PO CAPS
100.0000 mg | ORAL_CAPSULE | Freq: Two times a day (BID) | ORAL | Status: DC
  Administered 2023-03-01 – 2023-03-02 (×2): 100 mg via ORAL
  Filled 2023-03-01 (×2): qty 1

## 2023-03-01 MED ORDER — FUROSEMIDE 40 MG PO TABS: 40.0000 mg | ORAL_TABLET | Freq: Every day | ORAL | Status: DC | PRN

## 2023-03-01 MED ORDER — LEVOTHYROXINE SODIUM 88 MCG PO TABS
88.0000 ug | ORAL_TABLET | Freq: Every day | ORAL | Status: DC
  Administered 2023-03-02: 88 ug via ORAL
  Filled 2023-03-01: qty 1

## 2023-03-01 MED ORDER — ONDANSETRON HCL 4 MG/2ML IJ SOLN
INTRAMUSCULAR | Status: AC
  Filled 2023-03-01: qty 2

## 2023-03-01 MED ORDER — HYDROMORPHONE HCL 1 MG/ML IJ SOLN
0.2500 mg | INTRAMUSCULAR | Status: DC | PRN
  Administered 2023-03-01 (×3): 0.5 mg via INTRAVENOUS

## 2023-03-01 MED ORDER — STERILE WATER FOR IRRIGATION IR SOLN
Status: DC | PRN
  Administered 2023-03-01: 2000 mL

## 2023-03-01 MED ORDER — PROPOFOL 10 MG/ML IV BOLUS
INTRAVENOUS | Status: DC | PRN
Start: 2023-03-01 — End: 2023-03-01
  Administered 2023-03-01: 150 mg via INTRAVENOUS
  Administered 2023-03-01: 50 mg via INTRAVENOUS

## 2023-03-01 MED ORDER — MIDAZOLAM HCL 5 MG/5ML IJ SOLN
INTRAMUSCULAR | Status: DC | PRN
  Administered 2023-03-01: 2 mg via INTRAVENOUS

## 2023-03-01 MED ORDER — ALBUMIN HUMAN 5 % IV SOLN: INTRAVENOUS | Status: DC | PRN

## 2023-03-01 MED ORDER — ALPRAZOLAM 0.5 MG PO TABS
0.5000 mg | ORAL_TABLET | Freq: Every evening | ORAL | Status: DC | PRN
  Administered 2023-03-02: 0.5 mg via ORAL
  Filled 2023-03-01: qty 1

## 2023-03-01 MED ORDER — MEPERIDINE HCL 50 MG/ML IJ SOLN: 6.2500 mg | INTRAMUSCULAR | Status: DC | PRN

## 2023-03-01 MED ORDER — BISACODYL 10 MG RE SUPP: 10.0000 mg | Freq: Every day | RECTAL | Status: DC | PRN

## 2023-03-01 MED ORDER — DEXAMETHASONE SODIUM PHOSPHATE 10 MG/ML IJ SOLN
10.0000 mg | Freq: Once | INTRAMUSCULAR | Status: AC
  Administered 2023-03-02: 10 mg via INTRAVENOUS
  Filled 2023-03-01: qty 1

## 2023-03-01 MED ORDER — VANCOMYCIN HCL IN DEXTROSE 1-5 GM/200ML-% IV SOLN
1000.0000 mg | Freq: Two times a day (BID) | INTRAVENOUS | Status: AC
  Administered 2023-03-02: 1000 mg via INTRAVENOUS
  Filled 2023-03-01: qty 200

## 2023-03-01 MED ORDER — METHOCARBAMOL 1000 MG/10ML IJ SOLN: 500.0000 mg | Freq: Four times a day (QID) | INTRAVENOUS | Status: DC | PRN

## 2023-03-01 MED ORDER — PROMETHAZINE HCL 25 MG/ML IJ SOLN: 6.2500 mg | INTRAMUSCULAR | Status: DC | PRN

## 2023-03-01 MED ORDER — AMISULPRIDE (ANTIEMETIC) 5 MG/2ML IV SOLN: 10.0000 mg | Freq: Once | INTRAVENOUS | Status: DC | PRN

## 2023-03-01 MED ORDER — ONDANSETRON HCL 4 MG PO TABS
4.0000 mg | ORAL_TABLET | Freq: Four times a day (QID) | ORAL | Status: DC | PRN
  Administered 2023-03-02: 4 mg via ORAL
  Filled 2023-03-01: qty 1

## 2023-03-01 MED ORDER — ONDANSETRON HCL 4 MG/2ML IJ SOLN: 4.0000 mg | Freq: Four times a day (QID) | INTRAMUSCULAR | Status: DC | PRN

## 2023-03-01 MED ORDER — PHENOL 1.4 % MT LIQD: 1.0000 | OROMUCOSAL | Status: DC | PRN

## 2023-03-01 MED ORDER — FLUOXETINE HCL 20 MG PO CAPS
20.0000 mg | ORAL_CAPSULE | Freq: Every day | ORAL | Status: DC
  Administered 2023-03-02: 20 mg via ORAL
  Filled 2023-03-01: qty 1

## 2023-03-01 MED ORDER — CELECOXIB 200 MG PO CAPS
200.0000 mg | ORAL_CAPSULE | Freq: Every day | ORAL | Status: DC
  Administered 2023-03-01: 200 mg via ORAL
  Filled 2023-03-01: qty 1

## 2023-03-01 MED ORDER — 0.9 % SODIUM CHLORIDE (POUR BTL) OPTIME
TOPICAL | Status: DC | PRN
  Administered 2023-03-01: 1000 mL

## 2023-03-01 MED ORDER — ACETAMINOPHEN 10 MG/ML IV SOLN
INTRAVENOUS | Status: AC
  Filled 2023-03-01: qty 100

## 2023-03-01 MED ORDER — FENTANYL CITRATE (PF) 100 MCG/2ML IJ SOLN
INTRAMUSCULAR | Status: DC | PRN
  Administered 2023-03-01: 100 ug via INTRAVENOUS

## 2023-03-01 MED ORDER — LIDOCAINE HCL (CARDIAC) PF 100 MG/5ML IV SOSY
PREFILLED_SYRINGE | INTRAVENOUS | Status: DC | PRN
  Administered 2023-03-01: 60 mg via INTRAVENOUS

## 2023-03-01 MED ORDER — BUPIVACAINE HCL (PF) 0.25 % IJ SOLN
INTRAMUSCULAR | Status: AC
  Filled 2023-03-01: qty 30

## 2023-03-01 MED ORDER — ONDANSETRON HCL 4 MG/2ML IJ SOLN
INTRAMUSCULAR | Status: DC | PRN
  Administered 2023-03-01: 4 mg via INTRAVENOUS

## 2023-03-01 MED ORDER — OXYCODONE HCL 5 MG PO TABS
5.0000 mg | ORAL_TABLET | ORAL | Status: DC | PRN
  Administered 2023-03-02: 10 mg via ORAL
  Filled 2023-03-01: qty 2

## 2023-03-01 MED ORDER — PRAMIPEXOLE DIHYDROCHLORIDE 0.25 MG PO TABS
2.0000 mg | ORAL_TABLET | Freq: Three times a day (TID) | ORAL | Status: DC
  Administered 2023-03-01 – 2023-03-02 (×2): 2 mg via ORAL
  Filled 2023-03-01 (×2): qty 8

## 2023-03-01 MED ORDER — KETOROLAC TROMETHAMINE 30 MG/ML IJ SOLN
INTRAMUSCULAR | Status: DC | PRN
  Administered 2023-03-01: 30 mg

## 2023-03-01 MED ORDER — MIDAZOLAM HCL 2 MG/2ML IJ SOLN
INTRAMUSCULAR | Status: AC
  Filled 2023-03-01: qty 2

## 2023-03-01 MED ORDER — LEVOFLOXACIN IN D5W 500 MG/100ML IV SOLN
500.0000 mg | INTRAVENOUS | Status: AC
  Administered 2023-03-01: 500 mg via INTRAVENOUS
  Filled 2023-03-01: qty 100

## 2023-03-01 MED ORDER — EPINEPHRINE PF 1 MG/ML IJ SOLN
INTRAMUSCULAR | Status: AC
  Filled 2023-03-01: qty 1

## 2023-03-01 MED ORDER — PROPOFOL 500 MG/50ML IV EMUL
INTRAVENOUS | Status: AC
  Filled 2023-03-01: qty 50

## 2023-03-01 MED ORDER — HYDROMORPHONE HCL 1 MG/ML IJ SOLN
INTRAMUSCULAR | Status: DC | PRN
  Administered 2023-03-01 (×2): 1 mg via INTRAVENOUS

## 2023-03-01 SURGICAL SUPPLY — 42 items
ADH SKN CLS APL DERMABOND .7 (GAUZE/BANDAGES/DRESSINGS) ×1
BAG COUNTER SPONGE SURGICOUNT (BAG) IMPLANT
BAG SPEC THK2 15X12 ZIP CLS (MISCELLANEOUS)
BAG SPNG CNTER NS LX DISP (BAG)
BAG ZIPLOCK 12X15 (MISCELLANEOUS) IMPLANT
BLADE SAG 18X100X1.27 (BLADE) ×2 IMPLANT
COVER PERINEAL POST (MISCELLANEOUS) ×2 IMPLANT
COVER SURGICAL LIGHT HANDLE (MISCELLANEOUS) ×2 IMPLANT
CUP ACETBLR 52 OD PINNACLE (Hips) IMPLANT
DERMABOND ADVANCED .7 DNX12 (GAUZE/BANDAGES/DRESSINGS) ×2 IMPLANT
DRAPE FOOT SWITCH (DRAPES) ×2 IMPLANT
DRAPE STERI IOBAN 125X83 (DRAPES) ×2 IMPLANT
DRAPE U-SHAPE 47X51 STRL (DRAPES) ×4 IMPLANT
DRESSING AQUACEL AG SP 3.5X10 (GAUZE/BANDAGES/DRESSINGS) ×2 IMPLANT
DRSG AQUACEL AG ADV 3.5X 6 (GAUZE/BANDAGES/DRESSINGS) IMPLANT
DRSG AQUACEL AG SP 3.5X10 (GAUZE/BANDAGES/DRESSINGS) ×1
DURAPREP 26ML APPLICATOR (WOUND CARE) ×2 IMPLANT
ELECT REM PT RETURN 15FT ADLT (MISCELLANEOUS) ×2 IMPLANT
GLOVE BIO SURGEON STRL SZ 6 (GLOVE) ×2 IMPLANT
GLOVE BIOGEL PI IND STRL 6.5 (GLOVE) ×2 IMPLANT
GLOVE BIOGEL PI IND STRL 7.5 (GLOVE) ×2 IMPLANT
GLOVE ORTHO TXT STRL SZ7.5 (GLOVE) ×4 IMPLANT
GOWN STRL REUS W/ TWL LRG LVL3 (GOWN DISPOSABLE) ×4 IMPLANT
GOWN STRL REUS W/TWL LRG LVL3 (GOWN DISPOSABLE) ×2
HEAD CERAMIC 36 PLUS 8.5 12 14 (Hips) IMPLANT
HOLDER FOLEY CATH W/STRAP (MISCELLANEOUS) ×2 IMPLANT
KIT TURNOVER KIT A (KITS) IMPLANT
LINER NEUTRAL 52X36MM PLUS 4 (Liner) IMPLANT
NDL SAFETY ECLIP 18X1.5 (MISCELLANEOUS) IMPLANT
PACK ANTERIOR HIP CUSTOM (KITS) ×2 IMPLANT
SCREW 6.5MMX35MM (Screw) IMPLANT
STEM FEM ACTIS HIGH SZ7 (Stem) IMPLANT
SUT MNCRL AB 4-0 PS2 18 (SUTURE) ×2 IMPLANT
SUT STRATAFIX 0 PDS 27 VIOLET (SUTURE) ×1
SUT VIC AB 1 CT1 36 (SUTURE) ×6 IMPLANT
SUT VIC AB 2-0 CT1 27 (SUTURE) ×2
SUT VIC AB 2-0 CT1 TAPERPNT 27 (SUTURE) ×4 IMPLANT
SUTURE STRATFX 0 PDS 27 VIOLET (SUTURE) ×2 IMPLANT
SYR 3ML LL SCALE MARK (SYRINGE) IMPLANT
TRAY FOLEY MTR SLVR 16FR STAT (SET/KITS/TRAYS/PACK) IMPLANT
TUBE SUCTION HIGH CAP CLEAR NV (SUCTIONS) ×2 IMPLANT
WATER STERILE IRR 1000ML POUR (IV SOLUTION) ×2 IMPLANT

## 2023-03-01 NOTE — Anesthesia Procedure Notes (Signed)
Procedure Name: Intubation Date/Time: 03/01/2023 3:37 PM  Performed by: Johnette Abraham, CRNAPre-anesthesia Checklist: Patient identified, Emergency Drugs available, Suction available and Patient being monitored Patient Re-evaluated:Patient Re-evaluated prior to induction Oxygen Delivery Method: Circle System Utilized Preoxygenation: Pre-oxygenation with 100% oxygen Induction Type: IV induction Ventilation: Mask ventilation without difficulty Laryngoscope Size: Mac and 3 Grade View: Grade III Tube type: Oral Tube size: 7.0 mm Number of attempts: 1 Airway Equipment and Method: Stylet and Oral airway Placement Confirmation: ETT inserted through vocal cords under direct vision, positive ETCO2 and breath sounds checked- equal and bilateral Secured at: 22 cm Tube secured with: Tape Dental Injury: Teeth and Oropharynx as per pre-operative assessment

## 2023-03-01 NOTE — Interval H&P Note (Signed)
History and Physical Interval Note:  03/01/2023 1:21 PM  Kendra [name]  has presented today for surgery, with the diagnosis of Left hip osteoarthritis.  The various methods of treatment have been discussed with the patient and family. After consideration of risks, benefits and other options for treatment, the patient has consented to  Procedure(s): TOTAL HIP ARTHROPLASTY ANTERIOR APPROACH (Left) as a surgical intervention.  The patient's history has been reviewed, patient examined, no change in status, stable for surgery.  I have reviewed the patient's chart and labs.  Questions were answered to the patient's satisfaction.     [name]

## 2023-03-01 NOTE — Transfer of Care (Signed)
Immediate Anesthesia Transfer of Care Note  Patient: Kendra [name]  Procedure(s) Performed: TOTAL HIP ARTHROPLASTY ANTERIOR APPROACH (Left: Hip)  Patient Location: PACU  Anesthesia Type:General  Level of Consciousness: awake, alert , oriented, and patient cooperative  Airway & Oxygen Therapy: Patient Spontanous Breathing and Patient connected to face mask oxygen  Post-op Assessment: Report given to RN and Post -op Vital signs reviewed and stable  Post vital signs: Reviewed and stable  Last Vitals:  Vitals Value Taken Time  BP 132/98 03/01/23 1715  Temp    Pulse 73 03/01/23 1715  Resp 14 03/01/23 1715  SpO2 94 % 03/01/23 1715  Vitals shown include unfiled device data.  Last Pain:  Vitals:   03/01/23 1515  TempSrc:   PainSc: 7          Complications: No notable events documented.

## 2023-03-01 NOTE — Discharge Instructions (Signed)

## 2023-03-01 NOTE — Op Note (Signed)
NAME:  Kendra [name]                ACCOUNT NO.: [id number]      MEDICAL RECORD NO.: [id number]      FACILITY:  [facility]      PHYSICIAN:  [name]  DATE OF BIRTH:  Aug 13, 1945     DATE OF PROCEDURE:  03/01/2023                                 OPERATIVE REPORT         PREOPERATIVE DIAGNOSIS: Left  hip osteoarthritis.      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      SURGEON:  Madlyn Frankel. Charlann Boxer, M.D.      ASSISTANT:  Rosalene Billings, PA-C     ANESTHESIA:  Spinal.      SPECIMENS:  None.      COMPLICATIONS:  None.      BLOOD LOSS:  900 cc     DRAINS:  None.      INDICATION OF THE PROCEDURE:  Kendra [name] is a 77 y.o. female who had   presented to office for evaluation of left hip pain.  Radiographs revealed   progressive degenerative changes with bone-on-bone   articulation of the  hip joint, including subchondral cystic changes and osteophytes.  The patient had painful limited range of   motion significantly affecting their overall quality of life and function.  The patient was failing to    respond to conservative measures including medications and/or injections and activity modification and at this point was ready   to proceed with more definitive measures.  Consent was obtained for   benefit of pain relief.  Specific risks of infection, DVT, component   failure, dislocation, neurovascular injury, and need for revision surgery were reviewed in the office.     PROCEDURE IN DETAIL:  The patient was brought to operative theater.   Once adequate anesthesia, preoperative antibiotics, 2 gm of Ancef, 1 gm of Tranexamic Acid, and 10 mg of Decadron were administered, the patient was positioned  supine on the [employer] table.  Once the patient was safely positioned with adequate padding of boney prominences we predraped out the hip, and used fluoroscopy to confirm orientation of the pelvis.      The left hip was then prepped and draped from proximal iliac crest to   mid thigh with a shower curtain technique.      Time-out was performed identifying the patient, planned procedure, and the appropriate extremity.     An incision was then made 2 cm lateral to the   anterior superior iliac spine extending over the orientation of the   tensor fascia lata muscle and sharp dissection was carried down to the   fascia of the muscle.      The fascia was then incised.  The muscle belly was identified and swept   laterally and retractor placed along the superior neck.  Following   cauterization of the circumflex vessels and removing some pericapsular   fat, a second cobra retractor was placed  on the inferior neck.  A T-capsulotomy was made along the line of the   superior neck to the trochanteric fossa, then extended proximally and   distally.  Tag sutures were placed and the retractors were then placed   intracapsular.  We then identified the trochanteric fossa and   orientation of my neck cut and then made a neck osteotomy with the femur on traction.  The femoral   head was removed without difficulty or complication.  Traction was let   off and retractors were placed posterior and anterior around the   acetabulum.      The labrum and foveal tissue were debrided.  I began reaming with a 47 mm   reamer and reamed up to 51 mm reamer with good bony bed preparation and a 52 mm  cup was chosen.  The final 52 mm Pinnacle cup was then impacted under fluoroscopy to confirm the depth of penetration and orientation with respect to   Abduction and forward flexion.  A screw was placed into the ilium followed by the hole eliminator.  The final   36+4 neutral Altrex liner was impacted with good visualized  rim fit.  The cup was positioned anatomically within the acetabular portion of the pelvis.      At this point, the femur was rolled to 100 degrees.  Further capsule was   released off the inferior aspect of the femoral neck.  I then   released the superior capsule proximally.  With the leg in a neutral position the hook was placed laterally   along the femur under the vastus lateralis origin and elevated manually and then held in position using the hook attachment on the bed.  The leg was then extended and adducted with the leg rolled to 100   degrees of external rotation.  Retractors were placed along the medial calcar and posteriorly over the greater trochanter.  Once the proximal femur was fully   exposed, I used a box osteotome to set orientation.  I then began   broaching with the starting chili pepper broach and passed this by hand and then broached up to 7.  With the 7 broach in place I chose a high offset neck and did several trial reductions.  The offset was appropriate, leg lengths   appeared to be equal best matched with the +8.5 head ball trial confirmed radiographically.   Given these findings, I went ahead and dislocated the hip, repositioned all   retractors and positioned the right hip in the extended and abducted position.  The final 7 Hi Actis stem was   chosen and it was impacted down to the level of neck cut.  Based on this   and the trial reductions, a final 36+8.5 delta ceramic ball was chosen and   impacted onto a clean and dry trunnion, and the hip was reduced.  The   hip had been irrigated throughout the case again at this point.  I did   reapproximate the superior capsular leaflet to the anterior leaflet   using #1 Vicryl.  The fascia of the   tensor fascia lata muscle was then reapproximated using #1 Vicryl and #0 Stratafix sutures.  The   remaining wound was closed with 2-0 Vicryl and running 4-0 Monocryl.   The hip was cleaned, dried, and dressed sterilely using  Dermabond and   Aquacel dressing.  The patient was then brought   to recovery room in stable condition tolerating the procedure well.  Rosalene Billings, PA-C was present for the entirety of the case involved from   preoperative positioning, perioperative retractor management, general   facilitation of the case, as well as primary wound closure as assistant.            Madlyn Frankel Charlann Boxer, M.D.        03/01/2023 2:29 PM

## 2023-03-01 NOTE — H&P (Signed)
TOTAL HIP ADMISSION H&P  Patient is admitted for left total hip arthroplasty.  Therapy Plans: HEP Disposition: Home with sister & friends (lives alone) Planned DVT Prophylaxis: aspirin 81mg  BID DME needed: none PCP: Dr. Shary Decamp, clearance received TXA: IV Allergies: keflex - rash/hives on legs, flexeril - rash/hives Anesthesia Concerns: none BMI: 39.4 Last HgbA1c: Not diabetic   Other: - Talked about SNF -- hopeful to avoid this, lives alone, will need to be independent - oxycodone, robaxin, tylenol, celebrex - Takes oxycodone (for recent elbow surgery) >> Will get clarification on WB status from Dr. Amanda Pea, likely WBAT - No hx of VTE or cancer  Subjective:  Chief Complaint: left hip pain  HPI: Kendra [name], 77 y.o. female, has a history of pain and functional disability in the left hip(s) due to arthritis and patient has failed non-surgical conservative treatments for greater than 12 weeks to include NSAID's and/or analgesics and activity modification.  Onset of symptoms was abrupt starting 2 years ago with rapidlly worsening course since that time.The patient noted no past surgery on the left hip(s).  Patient currently rates pain in the left hip at 8 out of 10 with activity. Patient has worsening of pain with activity and weight bearing, pain that interfers with activities of daily living, and pain with passive range of motion. Patient has evidence of joint space narrowing by imaging studies. This condition presents safety issues increasing the risk of falls.  There is no current active infection.  Patient Active Problem List   Diagnosis Date Noted   Dyspnea on exertion 08/03/2021   History of COVID-19 08/03/2021   Anemia 07/16/2021   Depression 07/16/2021   Degenerative disc disease, lumbar 07/16/2021   Hypertension 07/16/2021   Mixed hyperlipidemia 07/16/2021   Morbid obesity (HCC) 07/16/2021   Peripheral edema 07/16/2021   Pre-diabetes 07/16/2021   Restless leg syndrome  07/16/2021   Sleep apnea 07/16/2021   Spinal headache 07/16/2021   Spinal stenosis at L4-L5 level 07/16/2021   Valvular heart disease 07/15/2021   Iron deficiency 12/30/2020   Iron deficiency anemia 12/29/2020   Chronic pain 12/11/2020   Lumbar spondylosis 05/21/2020   Atrophic vaginitis 12/26/2019   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 Follows along with urology uses bioidentical hormone   Benign essential hypertension 10/08/2015   Chest pain in adult 07/02/2015   Formatting of this note might be different from the original. 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 L3/4 7.2 mm with L4/5 mod to severe spinal stenosis, L5/S1 with slight disc on the right Formatting of this note might be different from the original. Overview:  L3/4 7.2 mm with L4/5 mod to severe spinal stenosis, L5/S1 with slight disc on the right   Valvular heart disease 07/15/2021    Past Surgical History:  Procedure Laterality Date   ABDOMINAL HYSTERECTOMY     partial   CATARACT EXTRACTION Bilateral    CESAREAN SECTION     x2   RHINOPLASTY     SPINAL CORD STIMULATOR INSERTION N/A 12/11/2020   Procedure: SPINAL CORD STIMULATOR INSERTION;  Surgeon: Venita Lick, MD;  Location: [facility] OR;  Service: Orthopedics;  Laterality: N/A;   TONSILLECTOMY      No current facility-administered medications for this encounter.   Current Outpatient Medications  Medication Sig Dispense Refill Last Dose   ALPRAZolam (XANAX) 0.5 MG tablet Take 0.5 mg by mouth at bedtime as needed for anxiety or sleep.      cholecalciferol (VITAMIN D3) 25 MCG (1000 UNIT) tablet Take 1,000 Units by mouth daily.      doxycycline (VIBRAMYCIN) 100 MG capsule Take 100 mg by mouth daily. 14 days      EPINEPHrine 0.3 mg/0.3 mL IJ SOAJ injection Inject 0.3 mg into the muscle as needed for anaphylaxis.      ferrous sulfate 325 (65 FE) MG EC tablet Take 325 mg by mouth daily.      FLUoxetine (PROZAC) 20 MG capsule Take 20 mg by mouth daily.      furosemide (LASIX) 40 MG tablet Take 40 mg by mouth daily as needed for edema.      latanoprost (XALATAN) 0.005 % ophthalmic solution Place 1 drop into both eyes at bedtime.      levothyroxine (SYNTHROID) 88 MCG tablet Take 88 mcg by mouth daily before breakfast.      omeprazole (PRILOSEC) 20 MG capsule Take 20 mg by  mouth daily.      oxyCODONE (OXY IR/ROXICODONE) 5 MG immediate release tablet Take 5 mg by mouth every 4 (four) hours as needed for severe pain.      pramipexole (MIRAPEX) 1 MG tablet Take 2 mg by mouth 3 (three) times daily.      Semaglutide, 2 MG/DOSE, (OZEMPIC, 2 MG/DOSE,) 8 MG/3ML SOPN Inject 2 mg into the skin once a week. 3 mL 0    SUMAtriptan (IMITREX) 100 MG tablet Take 100 mg by mouth every 2 (two) hours as needed for migraine. May repeat in 2 hours if headache persists or recurs.      celecoxib (CELEBREX) 200 MG capsule Take 200 mg by mouth daily.      Allergies  Allergen Reactions   Bee Venom Anaphylaxis   Feldene [Piroxicam]    Flexeril [Cyclobenzaprine] Nausea Only   Cephalosporins Rash   Keflex [  Cephalexin] Rash    Social History   Tobacco Use   Smoking status: Former   Smokeless tobacco: Never  Substance Use Topics   Alcohol use: Yes    Alcohol/week: 6.0 standard drinks of alcohol    Types: 2 Glasses of wine, 2 Cans of beer, 2 Shots of liquor per week    Comment: 2 a day    Family History  Problem Relation Age of Onset   Cancer - Prostate Father      Review of Systems  Constitutional:  Negative for chills and fever.  Respiratory:  Negative for cough and shortness of breath.   Cardiovascular:  Negative for chest pain.  Gastrointestinal:  Negative for nausea and vomiting.  Musculoskeletal:  Positive for arthralgias.     Objective:  Physical Exam Well nourished and well developed. General: Alert and oriented x3, cooperative and pleasant, no acute distress. Head: normocephalic, atraumatic, neck supple. Eyes: EOMI.  Musculoskeletal:  Left hip exam: Very limited range of motion of the left hip with pain with hip flexion internal rotation external rotation from 5 to 20 degrees Limited active hip flexion due to pain with external rotation contracture  RUE: In splint Moving fingers well  Calves soft and nontender. Motor function intact in LE. Strength  5/5 LE bilaterally. Neuro: Distal pulses 2+. Sensation to light touch intact in LE.  Vital signs in last 24 hours:    Labs:   Estimated body mass index is 38.01 kg/m as calculated from the following:   Height as of 02/16/23: 5\' 8"  (1.727 m).   Weight as of 02/16/23: 113.4 kg.   Imaging Review Plain radiographs demonstrate severe degenerative joint disease of the left hip(s). The bone quality appears to be adequate for age and reported activity level.      Assessment/Plan:  End stage arthritis, left hip(s)  The patient history, physical examination, clinical judgement of the provider and imaging studies are consistent with end stage degenerative joint disease of the left hip(s) and total hip arthroplasty is deemed medically necessary. The treatment options including medical management, injection therapy, arthroscopy and arthroplasty were discussed at length. The risks and benefits of total hip arthroplasty were presented and reviewed. The risks due to aseptic loosening, infection, stiffness, dislocation/subluxation,  thromboembolic complications and other imponderables were discussed.  The patient acknowledged the explanation, agreed to proceed with the plan and consent was signed. Patient is being admitted for inpatient treatment for surgery, pain control, PT, OT, prophylactic antibiotics, VTE prophylaxis, progressive ambulation and ADL's and discharge planning.The patient is planning to be discharged  home.   Rosalene Billings, PA-C Orthopedic Surgery [facility] [place] [phone]

## 2023-03-01 NOTE — Anesthesia Postprocedure Evaluation (Signed)
Anesthesia Post Note  Patient: Kendra [name]  Procedure(s) Performed: TOTAL HIP ARTHROPLASTY ANTERIOR APPROACH (Left: Hip)     Patient location during evaluation: PACU Anesthesia Type: General Level of consciousness: awake and alert Pain management: pain level controlled Vital Signs Assessment: post-procedure vital signs reviewed and stable Respiratory status: spontaneous breathing, nonlabored ventilation, respiratory function stable and patient connected to nasal cannula oxygen Cardiovascular status: blood pressure returned to baseline and stable Postop Assessment: no apparent nausea or vomiting Anesthetic complications: no  No notable events documented.  Last Vitals:  Vitals:   03/01/23 1900 03/01/23 1915  BP: (!) 149/77 139/74  Pulse: 64 62  Resp: 14 17  Temp:    SpO2: 93% 91%    Last Pain:  Vitals:   03/01/23 1845  TempSrc:   PainSc: 8                  Shelton Silvas

## 2023-03-01 NOTE — Anesthesia Preprocedure Evaluation (Addendum)
Anesthesia Evaluation  Patient identified by MRN, date of birth, ID band Patient awake    Reviewed: Allergy & Precautions, NPO status , Patient's Chart, lab work & pertinent test results  History of Anesthesia Complications (+) PONV, POST - OP SPINAL HEADACHE and history of anesthetic complications  Airway Mallampati: III  TM Distance: <3 FB Neck ROM: Full  Mouth opening: Limited Mouth Opening  Dental  (+) Teeth Intact, Dental Advisory Given   Pulmonary sleep apnea , former smoker   breath sounds clear to auscultation       Cardiovascular hypertension, Pt. on medications  Rhythm:Regular Rate:Normal     Neuro/Psych  Headaches PSYCHIATRIC DISORDERS  Depression       GI/Hepatic Neg liver ROS,GERD  Medicated,,  Endo/Other  Hypothyroidism    Renal/GU negative Renal ROS     Musculoskeletal  (+) Arthritis ,    Abdominal   Peds  Hematology negative hematology ROS (+)   Anesthesia Other Findings   Reproductive/Obstetrics                             Anesthesia Physical Anesthesia Plan  ASA: 3  Anesthesia Plan: General   Post-op Pain Management: Tylenol PO (pre-op)*   Induction: Intravenous  PONV Risk Score and Plan: 4 or greater and Ondansetron, Treatment may vary due to age or medical condition, Propofol infusion, TIVA and Dexamethasone  Airway Management Planned: Video Laryngoscope Planned and Oral ETT  Additional Equipment: None  Intra-op Plan:   Post-operative Plan: Extubation in OR  Informed Consent: I have reviewed the patients History and Physical, chart, labs and discussed the procedure including the risks, benefits and alternatives for the proposed anesthesia with the patient or authorized representative who has indicated his/her understanding and acceptance.       Plan Discussed with: CRNA  Anesthesia Plan Comments: (Spinal cord stimulator in place and functioning. Will  proceed with GA w/ TIVA to avoid damaging leads.   Lab Results      Component                Value               Date                      WBC                      7.9                 02/16/2023                HGB                      13.1                02/16/2023                HCT                      39.5                02/16/2023                MCV                      94.7  02/16/2023                PLT                      272                 02/16/2023           )       Anesthesia Quick Evaluation

## 2023-03-02 ENCOUNTER — Other Ambulatory Visit: Payer: Self-pay

## 2023-03-02 ENCOUNTER — Encounter (HOSPITAL_COMMUNITY): Payer: Self-pay | Admitting: Orthopedic Surgery

## 2023-03-02 DIAGNOSIS — Z8616 Personal history of COVID-19: Secondary | ICD-10-CM | POA: Diagnosis not present

## 2023-03-02 DIAGNOSIS — Z79899 Other long term (current) drug therapy: Secondary | ICD-10-CM | POA: Diagnosis not present

## 2023-03-02 DIAGNOSIS — Z87891 Personal history of nicotine dependence: Secondary | ICD-10-CM | POA: Diagnosis not present

## 2023-03-02 DIAGNOSIS — I10 Essential (primary) hypertension: Secondary | ICD-10-CM | POA: Diagnosis not present

## 2023-03-02 DIAGNOSIS — M1632 Unilateral osteoarthritis resulting from hip dysplasia, left hip: Secondary | ICD-10-CM | POA: Diagnosis not present

## 2023-03-02 DIAGNOSIS — E039 Hypothyroidism, unspecified: Secondary | ICD-10-CM | POA: Diagnosis not present

## 2023-03-02 MED ORDER — TIZANIDINE HCL 4 MG PO TABS
2.0000 mg | ORAL_TABLET | Freq: Three times a day (TID) | ORAL | Status: DC | PRN
  Administered 2023-03-02: 2 mg via ORAL
  Filled 2023-03-02: qty 1

## 2023-03-02 MED ORDER — OXYCODONE HCL 5 MG PO TABS: 5.0000 mg | ORAL_TABLET | ORAL | 0 refills | Status: DC | PRN

## 2023-03-02 MED ORDER — TIZANIDINE HCL 2 MG PO TABS: 2.0000 mg | ORAL_TABLET | Freq: Three times a day (TID) | ORAL | 2 refills | Status: DC | PRN

## 2023-03-02 MED ORDER — ASPIRIN 81 MG PO CHEW: 81.0000 mg | CHEWABLE_TABLET | Freq: Two times a day (BID) | ORAL | 0 refills | Status: AC

## 2023-03-02 MED ORDER — POLYETHYLENE GLYCOL 3350 17 G PO PACK: 17.0000 g | PACK | Freq: Two times a day (BID) | ORAL | 0 refills | Status: DC

## 2023-03-02 MED ORDER — KETOROLAC TROMETHAMINE 15 MG/ML IJ SOLN
7.5000 mg | Freq: Four times a day (QID) | INTRAMUSCULAR | Status: DC
  Administered 2023-03-02: 7.5 mg via INTRAVENOUS
  Filled 2023-03-02: qty 1

## 2023-03-02 NOTE — Progress Notes (Addendum)
Subjective: 1 Day Post-Op Procedure(s) (LRB): TOTAL HIP ARTHROPLASTY ANTERIOR APPROACH (Left) Patient reports pain as moderate.   Patient seen in rounds by Dr. Charlann Boxer. Patient is resting in bed on exam this morning. No acute events overnight. Foley catheter removed. Patient has not been up with PT yet. She has concerns about pain management. We will start therapy today.   Objective: Vital signs in last 24 hours: Temp:  [97.4 F (36.3 C)-98.2 F (36.8 C)] 98.2 F (36.8 C) (08/07 0637) Pulse Rate:  [52-87] 75 (08/07 0637) Resp:  [12-18] 16 (08/07 0637) BP: (119-169)/(66-98) 119/70 (08/07 0637) SpO2:  [91 %-97 %] 95 % (08/07 0637) Weight:  [113.4 kg] 113.4 kg (08/06 1337)  Intake/Output from previous day:  Intake/Output Summary (Last 24 hours) at 03/02/2023 0740 Last data filed at 03/02/2023 0700 Gross per 24 hour  Intake 2994.33 ml  Output 1850 ml  Net 1144.33 ml     Intake/Output this shift: No intake/output data recorded.  Labs: Recent Labs    03/02/23 0340  HGB 10.6*   Recent Labs    03/02/23 0340  WBC 12.6*  RBC 3.29*  HCT 32.1*  PLT 211   Recent Labs    03/02/23 0340  NA 135  K 3.8  CL 101  CO2 26  BUN 10  CREATININE 0.50  GLUCOSE 203*  CALCIUM 8.6*   No results for input(s): "LABPT", "INR" in the last 72 hours.  Exam: General - Patient is Alert and Oriented Extremity - Neurologically intact Sensation intact distally Intact pulses distally Dorsiflexion/Plantar flexion intact Dressing - dressing C/D/I Motor Function - intact, moving foot and toes well on exam.   Past Medical History:  Diagnosis Date   Anemia    Atrophic vaginitis 12/26/2019   Formatting of this note might be different from the original. Follows along with urology uses bioidentical hormone   Benign essential hypertension 10/08/2015   Chest pain in adult 07/02/2015   Formatting of this note might be different from the original. 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 L3/4 7.2 mm with L4/5 mod to severe spinal stenosis, L5/S1 with slight disc on the right Formatting of this note might be different from the original. Overview:  L3/4 7.2 mm with L4/5 mod to severe spinal stenosis, L5/S1 with slight disc on the right   Valvular  heart disease 07/15/2021    Assessment/Plan: 1 Day Post-Op Procedure(s) (LRB): TOTAL HIP ARTHROPLASTY ANTERIOR APPROACH (Left) Principal Problem:   S/P total left hip arthroplasty  Estimated body mass index is 38.01 kg/m as calculated from the following:   Height as of this encounter: 5\' 8"  (1.727 m).   Weight as of this encounter: 113.4 kg. Advance diet Up with therapy D/C IV fluids  DVT Prophylaxis - Aspirin Weight bearing as tolerated.  Hgb stable at 10.6 this AM.  Will try 2 doses of toradol today for improved pain control. Cr. 0.5  Will also switch to tizanidine.  She has concerns about pain  control based on usage of Hydrocodone/ oxycodone over the past several months. We will see how she does today, and may change meds if needed.   Plan is to go Home after hospital stay. Plan for discharge today after meeting goals with therapy. Follow up in the office in 2 weeks.   Rosalene Billings, PA-C Orthopedic Surgery [phone] 03/02/2023, 7:40 AM

## 2023-03-02 NOTE — Plan of Care (Signed)
  Problem: Education: Goal: Knowledge of General Education information will improve Description: Including pain rating scale, medication(s)/side effects and non-pharmacologic comfort measures 03/02/2023 0908 by Beverly Sessions, RN Outcome: Progressing 03/02/2023 0908 by Beverly Sessions, RN Outcome: Progressing   Problem: Activity: Goal: Risk for activity intolerance will decrease 03/02/2023 0908 by Beverly Sessions, RN Outcome: Progressing 03/02/2023 0908 by Beverly Sessions, RN Outcome: Progressing   Problem: Pain Managment: Goal: General experience of comfort will improve 03/02/2023 0908 by Beverly Sessions, RN Outcome: Progressing 03/02/2023 0908 by Beverly Sessions, RN Outcome: Progressing

## 2023-03-02 NOTE — Progress Notes (Signed)
Physical Therapy Treatment Patient Details Name: Kendra [name] MRN: [id number] DOB: 1945/07/28 Today's Date: 03/02/2023   History of Present Illness 77 y.o. female admitted 03/01/23 for L AA-THA. PMH: HTN, DDD, morbid obesity, spinal stenosis L4-L5, depression, R ulnar nerve release surgery 02/08/23    PT Comments  BP 130/70 in sitting at start of session. Pt ambulated 68' with RW, no loss of balance, no dizziness. Will see pt one more session this afternoon, then I expect she'll be ready to DC home from a PT standpoint if BP issues remain resolved.     If plan is discharge home, recommend the following: A little help with walking and/or transfers;A little help with bathing/dressing/bathroom;Assistance with cooking/housework;Assist for transportation;Help with stairs or ramp for entrance   Can travel by private vehicle        Equipment Recommendations  None recommended by PT    Recommendations for Other Services       Precautions / Restrictions Precautions Precautions: Fall Restrictions Weight Bearing Restrictions: No Other Position/Activity Restrictions: WBAT     Mobility  Bed Mobility Overal bed mobility: Needs Assistance Bed Mobility: Supine to Sit     Supine to sit: Min assist, HOB elevated, Used rails     General bed mobility comments: up in recliner    Transfers Overall transfer level: Needs assistance Equipment used: Rolling walker (2 wheels) Transfers: Sit to/from Stand Sit to Stand: From elevated surface, Contact guard assist   Step pivot transfers: Contact guard assist       General transfer comment: VCs hand placement, no loss of balance, denied dizziness in sitting and standing. BP 130/70 sitting    Ambulation/Gait Ambulation/Gait assistance: Contact guard assist Gait Distance (Feet): 85 Feet Assistive device: Rolling walker (2 wheels) Gait Pattern/deviations: Step-to pattern, Decreased step length - left, Decreased step length - right Gait  velocity: decr     General Gait Details: steady, no loss of balance, distance limited by 7/10 L hip pain   Stairs             Wheelchair Mobility     Tilt Bed    Modified Rankin (Stroke Patients Only)       Balance Overall balance assessment: Modified Independent                                          Cognition Arousal: Alert Behavior During Therapy: WFL for tasks assessed/performed Overall Cognitive Status: Within Functional Limits for tasks assessed                                          Exercises Total Joint Exercises Ankle Circles/Pumps: AROM, Both, 10 reps, Supine Quad Sets: AROM, Both, 5 reps, Supine Heel Slides: AAROM, Left, 5 reps, Supine Hip ABduction/ADduction: AAROM, Left, 5 reps, Supine    General Comments        Pertinent Vitals/Pain Pain Assessment Pain Assessment: 0-10 Pain Score: 7  Pain Location: L hip with movement Pain Descriptors / Indicators: Sore, Pressure Pain Intervention(s): Limited activity within patient's tolerance, Monitored during session, Ice applied, Patient requesting pain meds-RN notified    Home Living Family/patient expects to be discharged to:: Private residence Living Arrangements: Alone Available Help at Discharge: Family;Available 24 hours/day   Home Access: Level entry  Home Layout: One level Home Equipment: Agricultural consultant (2 wheels);BSC/3in1;Wheelchair - manual Additional Comments: sister from [place] will stay with pt 24/7    Prior Function            PT Goals (current goals can now be found in the care plan section) Acute Rehab PT Goals Patient Stated Goal: to walk PT Goal Formulation: With patient/family Time For Goal Achievement: 03/09/23 Potential to Achieve Goals: Good Progress towards PT goals: Progressing toward goals    Frequency    7X/week      PT Plan      Co-evaluation              AM-PAC PT "6 Clicks" Mobility   Outcome  Measure  Help needed turning from your back to your side while in a flat bed without using bedrails?: A Little Help needed moving from lying on your back to sitting on the side of a flat bed without using bedrails?: A Little Help needed moving to and from a bed to a chair (including a wheelchair)?: A Little Help needed standing up from a chair using your arms (e.g., wheelchair or bedside chair)?: A Little Help needed to walk in hospital room?: A Little Help needed climbing 3-5 steps with a railing? : A Little 6 Click Score: 18    End of Session Equipment Utilized During Treatment: Gait belt Activity Tolerance: Patient tolerated treatment well (hypotension) Patient left: in chair;with call bell/phone within reach;with chair alarm set;with family/visitor present Nurse Communication: Mobility status PT Visit Diagnosis: Difficulty in walking, not elsewhere classified (R26.2);Pain;Other abnormalities of gait and mobility (R26.89) Pain - Right/Left: Left Pain - part of body: Hip     Time: 1232-1300 PT Time Calculation (min) (ACUTE ONLY): 28 min  Charges:    $Gait Training: 8-22 mins $Therapeutic Exercise: 8-22 mins PT General Charges $$ ACUTE PT VISIT: 1 Visit                     Tamala Ser PT 03/02/2023  Acute Rehabilitation Services  Office [phone]

## 2023-03-02 NOTE — Evaluation (Signed)
Physical Therapy Evaluation Patient Details Name: Kendra [name] MRN: [id number] DOB: [birth year] Today's Date: 03/02/2023  History of Present Illness  77 y.o. female admitted 03/01/23 for L AA-THA. PMH: HTN, DDD, morbid obesity, spinal stenosis L4-L5, depression, R ulnar nerve release surgery 02/08/23  Clinical Impression  Pt is s/p THA resulting in the deficits listed below (see PT Problem List). Pt transferred bed to recliner with min assist, no loss of balance. Ambulation deferred 2* hypotension, BP 83/54 sitting. Pt denied dizziness in sitting and standing but is somewhat lethargic, possibly due to pain medication. RN notified of hypotension. Good progress expected when pt is more alert and hypotension resolves.  Pt will benefit from acute skilled PT to increase their independence and safety with mobility to facilitate discharge.          If plan is discharge home, recommend the following: A little help with walking and/or transfers;A little help with bathing/dressing/bathroom;Assistance with cooking/housework;Assist for transportation;Help with stairs or ramp for entrance   Can travel by private vehicle        Equipment Recommendations None recommended by PT  Recommendations for Other Services       Functional Status Assessment Patient has had a recent decline in their functional status and demonstrates the ability to make significant improvements in function in a reasonable and predictable amount of time.     Precautions / Restrictions Precautions Precautions: Fall Restrictions Weight Bearing Restrictions: No Other Position/Activity Restrictions: WBAT      Mobility  Bed Mobility Overal bed mobility: Needs Assistance Bed Mobility: Supine to Sit     Supine to sit: Min assist, HOB elevated, Used rails     General bed mobility comments: assist to raise trunk and pivot hips to EOB.    Transfers Overall transfer level: Needs assistance Equipment used: Rolling walker (2  wheels) Transfers: Sit to/from Stand, Bed to chair/wheelchair/BSC Sit to Stand: Min assist, From elevated surface   Step pivot transfers: Contact guard assist       General transfer comment: VCs hand placement, no loss of balance, denied dizziness in sitting and standing. BP 83/54 sitting    Ambulation/Gait               General Gait Details: did not attempt 2* hypotension (BP 83/54 sitting, but asymptomatic)  Stairs            Wheelchair Mobility     Tilt Bed    Modified Rankin (Stroke Patients Only)       Balance Overall balance assessment: Modified Independent                                           Pertinent Vitals/Pain Pain Assessment Pain Assessment: 0-10 Pain Score: 8  Pain Location: L hip with movement Pain Descriptors / Indicators: Sore Pain Intervention(s): Limited activity within patient's tolerance, Monitored during session, Premedicated before session, Ice applied    Home Living Family/patient expects to be discharged to:: Private residence Living Arrangements: Alone Available Help at Discharge: Family;Available 24 hours/day   Home Access: Level entry       Home Layout: One level Home Equipment: Agricultural consultant (2 wheels);BSC/3in1;Wheelchair - manual Additional Comments: sister from [place] will stay with pt 24/7    Prior Function Prior Level of Function : Independent/Modified Independent             Mobility Comments: walked with  RW, used WC for longer distances ADLs Comments: independent     Extremity/Trunk Assessment   Upper Extremity Assessment Upper Extremity Assessment: RUE deficits/detail;Right hand dominate RUE Deficits / Details: recent ulnar release surgery 3 weeks ago, reports some tingling/numbness 4th and 5th fingers, grip 4/5, pt is R handed RUE Sensation: decreased light touch    Lower Extremity Assessment Lower Extremity Assessment: LLE deficits/detail LLE Deficits / Details: knee ext  at least 3/5, hip flexion AAROM ~30* limited by pain, hip 2/5 grossly LLE Sensation: WNL LLE Coordination: WNL    Cervical / Trunk Assessment Cervical / Trunk Assessment: Normal  Communication   Communication Communication: No apparent difficulties Cueing Techniques: Verbal cues  Cognition Arousal: Lethargic, Suspect due to medications Behavior During Therapy: WFL for tasks assessed/performed Overall Cognitive Status: Within Functional Limits for tasks assessed                                          General Comments      Exercises Total Joint Exercises Ankle Circles/Pumps: AROM, Both, 10 reps, Supine Quad Sets: AROM, Both, 5 reps, Supine Heel Slides: AAROM, Left, 5 reps, Supine Hip ABduction/ADduction: AAROM, Left, 5 reps, Supine   Assessment/Plan    PT Assessment Patient needs continued PT services  PT Problem List Decreased activity tolerance;Decreased range of motion;Decreased strength;Decreased balance;Decreased mobility;Pain       PT Treatment Interventions DME instruction;Gait training;Therapeutic activities;Therapeutic exercise;Patient/family education    PT Goals (Current goals can be found in the Care Plan section)  Acute Rehab PT Goals Patient Stated Goal: to walk PT Goal Formulation: With patient Time For Goal Achievement: 03/09/23 Potential to Achieve Goals: Good    Frequency 7X/week     Co-evaluation               AM-PAC PT "6 Clicks" Mobility  Outcome Measure Help needed turning from your back to your side while in a flat bed without using bedrails?: A Little Help needed moving from lying on your back to sitting on the side of a flat bed without using bedrails?: A Little Help needed moving to and from a bed to a chair (including a wheelchair)?: A Little Help needed standing up from a chair using your arms (e.g., wheelchair or bedside chair)?: A Little Help needed to walk in hospital room?: A Lot Help needed climbing 3-5  steps with a railing? : Total 6 Click Score: 15    End of Session Equipment Utilized During Treatment: Gait belt Activity Tolerance: Treatment limited secondary to medical complications (Comment) (hypotension) Patient left: in chair;with call bell/phone within reach;with chair alarm set Nurse Communication: Mobility status PT Visit Diagnosis: Difficulty in walking, not elsewhere classified (R26.2);Pain;Other abnormalities of gait and mobility (R26.89) Pain - Right/Left: Left Pain - part of body: Hip    Time: 0919-1006 PT Time Calculation (min) (ACUTE ONLY): 47 min   Charges:   PT Evaluation $PT Eval Moderate Complexity: 1 Mod PT Treatments $Gait Training: 8-22 mins $Therapeutic Exercise: 8-22 mins PT General Charges $$ ACUTE PT VISIT: 1 Visit        Tamala Ser PT 03/02/2023  Acute Rehabilitation Services  Office [phone]

## 2023-03-02 NOTE — Progress Notes (Signed)
Physical Therapy Treatment Patient Details Name: Kendra [name] MRN: [id number] DOB: [birth year] Today's Date: 03/02/2023   History of Present Illness 77 y.o. female admitted 03/01/23 for L AA-THA. PMH: HTN, DDD, morbid obesity, spinal stenosis L4-L5, depression, R ulnar nerve release surgery 02/08/23    PT Comments  Pt is progressing well with mobility, she ambulated 130' with RW, no loss of balance. BP 111/63 in sitting at start of session, pt denied dizziness throughout session. Reviewed HEP with pt and her sister, who will be primary CG. She is ready to DC home from a PT standpoint.      If plan is discharge home, recommend the following: A little help with walking and/or transfers;A little help with bathing/dressing/bathroom;Assistance with cooking/housework;Assist for transportation;Help with stairs or ramp for entrance   Can travel by private vehicle        Equipment Recommendations  None recommended by PT    Recommendations for Other Services       Precautions / Restrictions Precautions Precautions: Fall Restrictions Weight Bearing Restrictions: No Other Position/Activity Restrictions: WBAT     Mobility  Bed Mobility Overal bed mobility: Needs Assistance Bed Mobility: Supine to Sit     Supine to sit: Min assist, HOB elevated, Used rails     General bed mobility comments: up in recliner    Transfers Overall transfer level: Needs assistance Equipment used: Rolling walker (2 wheels) Transfers: Sit to/from Stand Sit to Stand: From elevated surface, Supervision   Step pivot transfers: Contact guard assist       General transfer comment: VCs hand placement, no loss of balance, denied dizziness in sitting and standing. BP 111/63 sitting    Ambulation/Gait Ambulation/Gait assistance: Supervision Gait Distance (Feet): 130 Feet Assistive device: Rolling walker (2 wheels) Gait Pattern/deviations: Step-to pattern, Decreased step length - left, Decreased step length -  right Gait velocity: decr     General Gait Details: steady, no loss of balance   Stairs             Wheelchair Mobility     Tilt Bed    Modified Rankin (Stroke Patients Only)       Balance Overall balance assessment: Modified Independent                                          Cognition Arousal: Alert Behavior During Therapy: WFL for tasks assessed/performed Overall Cognitive Status: Within Functional Limits for tasks assessed                                          Exercises Total Joint Exercises Ankle Circles/Pumps: AROM, Both, 10 reps, Supine Quad Sets: AROM, Both, 5 reps, Supine Short Arc Quad: AROM, Left, 5 reps, Supine Heel Slides: AAROM, Left, 5 reps, Supine Hip ABduction/ADduction: AAROM, Left, 5 reps, Supine Long Arc Quad: AROM, Left, 5 reps, Seated    General Comments        Pertinent Vitals/Pain Pain Assessment Pain Assessment: 0-10 Pain Score: 4  Pain Location: L hip with movement Pain Descriptors / Indicators: Sore, Pressure Pain Intervention(s): Limited activity within patient's tolerance, Monitored during session, Premedicated before session, Ice applied    Home Living  Prior Function            PT Goals (current goals can now be found in the care plan section) Acute Rehab PT Goals Patient Stated Goal: to walk PT Goal Formulation: With patient/family Time For Goal Achievement: 03/09/23 Potential to Achieve Goals: Good Progress towards PT goals: Progressing toward goals    Frequency    7X/week      PT Plan      Co-evaluation              AM-PAC PT "6 Clicks" Mobility   Outcome Measure  Help needed turning from your back to your side while in a flat bed without using bedrails?: A Little Help needed moving from lying on your back to sitting on the side of a flat bed without using bedrails?: A Little Help needed moving to and from a bed to a  chair (including a wheelchair)?: None Help needed standing up from a chair using your arms (e.g., wheelchair or bedside chair)?: None Help needed to walk in hospital room?: None Help needed climbing 3-5 steps with a railing? : A Little 6 Click Score: 21    End of Session Equipment Utilized During Treatment: Gait belt Activity Tolerance: Patient tolerated treatment well (hypotension) Patient left: in chair;with call bell/phone within reach;with chair alarm set;with family/visitor present Nurse Communication: Mobility status PT Visit Diagnosis: Difficulty in walking, not elsewhere classified (R26.2);Pain;Other abnormalities of gait and mobility (R26.89) Pain - Right/Left: Left Pain - part of body: Hip     Time: 3664-4034 PT Time Calculation (min) (ACUTE ONLY): 25 min  Charges:    $Gait Training: 8-22 mins $Therapeutic Exercise: 8-22 mins PT General Charges $$ ACUTE PT VISIT: 1 Visit                    Tamala Ser PT 03/02/2023  Acute Rehabilitation Services  Office [phone]

## 2023-03-02 NOTE — TOC Transition Note (Signed)
Transition of Care [facility]) - CM/SW Discharge Note  Patient Details  Name: Kendra [name] MRN: [id number] Date of Birth: 1945/10/09  Transition of Care [facility]) CM/SW Contact:  Ewing Schlein, LCSW Phone Number: 03/02/2023, 11:01 AM  Clinical Narrative: Patient is expected to discharge home after working with PT. CSW met with patient to confirm discharge plan. Patient will go home with a home exercise program (HEP). Patient has a rolling walker at home, so there are no DME needs at this time. TOC signing off.   Final next level of care: Home/Self Care Barriers to Discharge: No Barriers Identified  Patient Goals and CMS Choice Choice offered to / list presented to : NA  Discharge Plan and Services Additional resources added to the After Visit Summary for         DME Arranged: N/A DME Agency: NA  Social Determinants of Health (SDOH) Interventions SDOH Screenings   Food Insecurity: No Food Insecurity (03/01/2023)  Housing: Low Risk  (03/02/2023)  Transportation Needs: No Transportation Needs (03/02/2023)  Utilities: Not At Risk (03/02/2023)  Financial Resource Strain: Low Risk  (12/22/2020)   Received from [facility] visits prior to 09/25/2022., [facility] visits prior to 09/25/2022.  Physical Activity: Unknown (12/22/2020)   Received from [facility] visits prior to 09/25/2022., [facility] visits prior to 09/25/2022.  Social Connections: Moderately Integrated (12/22/2020)   Received from [facility] visits prior to 09/25/2022., [facility] visits prior to 09/25/2022.  Stress: Stress Concern Present (12/22/2020)   Received from [facility] visits prior to 09/25/2022., [facility] visits prior to 09/25/2022.  Tobacco Use: Medium Risk (03/01/2023)   Readmission Risk Interventions     No data to display

## 2023-03-14 DIAGNOSIS — F39 Unspecified mood [affective] disorder: Secondary | ICD-10-CM | POA: Diagnosis not present

## 2023-03-14 DIAGNOSIS — K5900 Constipation, unspecified: Secondary | ICD-10-CM | POA: Diagnosis not present

## 2023-03-14 DIAGNOSIS — Z6837 Body mass index (BMI) 37.0-37.9, adult: Secondary | ICD-10-CM | POA: Diagnosis not present

## 2023-03-14 DIAGNOSIS — F32A Depression, unspecified: Secondary | ICD-10-CM | POA: Diagnosis not present

## 2023-03-14 DIAGNOSIS — K219 Gastro-esophageal reflux disease without esophagitis: Secondary | ICD-10-CM | POA: Diagnosis not present

## 2023-03-14 DIAGNOSIS — Z87891 Personal history of nicotine dependence: Secondary | ICD-10-CM | POA: Diagnosis not present

## 2023-03-14 DIAGNOSIS — E782 Mixed hyperlipidemia: Secondary | ICD-10-CM | POA: Diagnosis not present

## 2023-03-14 DIAGNOSIS — Z79890 Hormone replacement therapy (postmenopausal): Secondary | ICD-10-CM | POA: Diagnosis not present

## 2023-03-14 DIAGNOSIS — H409 Unspecified glaucoma: Secondary | ICD-10-CM | POA: Diagnosis not present

## 2023-03-14 DIAGNOSIS — F419 Anxiety disorder, unspecified: Secondary | ICD-10-CM | POA: Diagnosis not present

## 2023-03-14 DIAGNOSIS — Z7982 Long term (current) use of aspirin: Secondary | ICD-10-CM | POA: Diagnosis not present

## 2023-03-14 DIAGNOSIS — D649 Anemia, unspecified: Secondary | ICD-10-CM | POA: Diagnosis not present

## 2023-03-14 DIAGNOSIS — G4733 Obstructive sleep apnea (adult) (pediatric): Secondary | ICD-10-CM | POA: Diagnosis not present

## 2023-03-14 DIAGNOSIS — M7981 Nontraumatic hematoma of soft tissue: Secondary | ICD-10-CM | POA: Diagnosis not present

## 2023-03-14 DIAGNOSIS — I340 Nonrheumatic mitral (valve) insufficiency: Secondary | ICD-10-CM | POA: Diagnosis not present

## 2023-03-14 DIAGNOSIS — Z6838 Body mass index (BMI) 38.0-38.9, adult: Secondary | ICD-10-CM | POA: Diagnosis not present

## 2023-03-14 DIAGNOSIS — M25552 Pain in left hip: Secondary | ICD-10-CM | POA: Diagnosis not present

## 2023-03-14 DIAGNOSIS — Z96642 Presence of left artificial hip joint: Secondary | ICD-10-CM | POA: Diagnosis not present

## 2023-03-14 DIAGNOSIS — M79662 Pain in left lower leg: Secondary | ICD-10-CM | POA: Diagnosis not present

## 2023-03-14 DIAGNOSIS — M7989 Other specified soft tissue disorders: Secondary | ICD-10-CM | POA: Diagnosis not present

## 2023-03-14 DIAGNOSIS — R600 Localized edema: Secondary | ICD-10-CM | POA: Diagnosis not present

## 2023-03-14 DIAGNOSIS — E871 Hypo-osmolality and hyponatremia: Secondary | ICD-10-CM | POA: Diagnosis not present

## 2023-03-14 DIAGNOSIS — D509 Iron deficiency anemia, unspecified: Secondary | ICD-10-CM | POA: Diagnosis not present

## 2023-03-14 DIAGNOSIS — T84031A Mechanical loosening of internal left hip prosthetic joint, initial encounter: Secondary | ICD-10-CM | POA: Diagnosis not present

## 2023-03-14 DIAGNOSIS — Z471 Aftercare following joint replacement surgery: Secondary | ICD-10-CM | POA: Diagnosis not present

## 2023-03-14 DIAGNOSIS — F1090 Alcohol use, unspecified, uncomplicated: Secondary | ICD-10-CM | POA: Diagnosis not present

## 2023-03-14 DIAGNOSIS — Z79899 Other long term (current) drug therapy: Secondary | ICD-10-CM | POA: Diagnosis not present

## 2023-03-14 DIAGNOSIS — Z87442 Personal history of urinary calculi: Secondary | ICD-10-CM | POA: Diagnosis not present

## 2023-03-14 DIAGNOSIS — Z881 Allergy status to other antibiotic agents status: Secondary | ICD-10-CM | POA: Diagnosis not present

## 2023-03-14 DIAGNOSIS — I10 Essential (primary) hypertension: Secondary | ICD-10-CM | POA: Diagnosis not present

## 2023-03-14 DIAGNOSIS — Z91030 Bee allergy status: Secondary | ICD-10-CM | POA: Diagnosis not present

## 2023-03-14 DIAGNOSIS — M96842 Postprocedural seroma of a musculoskeletal structure following a musculoskeletal system procedure: Secondary | ICD-10-CM | POA: Diagnosis not present

## 2023-03-14 DIAGNOSIS — L03116 Cellulitis of left lower limb: Secondary | ICD-10-CM | POA: Diagnosis present

## 2023-03-14 DIAGNOSIS — M1990 Unspecified osteoarthritis, unspecified site: Secondary | ICD-10-CM | POA: Diagnosis not present

## 2023-03-14 DIAGNOSIS — G2581 Restless legs syndrome: Secondary | ICD-10-CM | POA: Diagnosis not present

## 2023-03-14 DIAGNOSIS — E039 Hypothyroidism, unspecified: Secondary | ICD-10-CM | POA: Diagnosis present

## 2023-03-14 DIAGNOSIS — Y831 Surgical operation with implant of artificial internal device as the cause of abnormal reaction of the patient, or of later complication, without mention of misadventure at the time of the procedure: Secondary | ICD-10-CM | POA: Diagnosis present

## 2023-03-14 DIAGNOSIS — G4730 Sleep apnea, unspecified: Secondary | ICD-10-CM | POA: Diagnosis not present

## 2023-03-14 DIAGNOSIS — G43909 Migraine, unspecified, not intractable, without status migrainosus: Secondary | ICD-10-CM | POA: Diagnosis not present

## 2023-03-14 DIAGNOSIS — Z79891 Long term (current) use of opiate analgesic: Secondary | ICD-10-CM | POA: Diagnosis not present

## 2023-03-14 DIAGNOSIS — Z792 Long term (current) use of antibiotics: Secondary | ICD-10-CM | POA: Diagnosis not present

## 2023-03-14 DIAGNOSIS — Z888 Allergy status to other drugs, medicaments and biological substances status: Secondary | ICD-10-CM | POA: Diagnosis not present

## 2023-03-14 DIAGNOSIS — Z8616 Personal history of COVID-19: Secondary | ICD-10-CM | POA: Diagnosis not present

## 2023-03-14 DIAGNOSIS — I272 Pulmonary hypertension, unspecified: Secondary | ICD-10-CM | POA: Diagnosis not present

## 2023-03-14 NOTE — Discharge Summary (Signed)
Patient ID: Kendra [name] MRN: [id number] DOB/AGE: 75-Jul-1947 75 y.o.  Admit date: 03/01/2023 Discharge date: 03/02/2023  Admission Diagnoses:  Left hip osteoarthritis  Discharge Diagnoses:  Principal Problem:   S/P total left hip arthroplasty   Past Medical History:  Diagnosis Date   Anemia    Atrophic vaginitis 12/26/2019   Formatting of this note might be different from the original. Follows along with urology uses bioidentical hormone   Benign essential hypertension 10/08/2015   Chest pain in adult 07/02/2015   Formatting of this note might be different from the original. 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   Hypertension    Insomnia 11/27/2013   Iron deficiency 12/30/2020   Iron deficiency anemia 12/29/2020   Lumbar spondylosis 05/21/2020   Malaise and fatigue 05/25/2018   Mixed hyperlipidemia    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 L3/4 7.2 mm with L4/5 mod to severe spinal stenosis, L5/S1 with slight disc on the right Formatting of this note might be different from the original. Overview:  L3/4 7.2 mm with L4/5 mod to severe spinal stenosis, L5/S1 with slight disc on the  right   Valvular heart disease 07/15/2021    Surgeries: Procedure(s): TOTAL HIP ARTHROPLASTY ANTERIOR APPROACH on 03/01/2023   Consultants:   Discharged Condition: Improved  Hospital Course: Kendra [name] is an 77 y.o. female who was admitted 03/01/2023 for operative treatment ofS/P total left hip arthroplasty. Patient has severe unremitting pain that affects sleep, daily activities, and work/hobbies. After pre-op clearance the patient was taken to the operating room on 03/01/2023 and underwent  Procedure(s): TOTAL HIP ARTHROPLASTY ANTERIOR APPROACH.    Patient was given perioperative antibiotics:  Anti-infectives (From admission, onward)    Start     Dose/Rate Route Frequency Ordered Stop   03/02/23 0200  vancomycin (VANCOCIN) IVPB 1000 mg/200 mL premix        1,000 mg 200 mL/hr over 60 Minutes Intravenous Every 12 hours 03/01/23 1953 03/02/23 0258   03/01/23 1400  levofloxacin (LEVAQUIN) IVPB 500 mg        500 mg 100 mL/hr over 60 Minutes Intravenous On call to O.R. 03/01/23 1337 03/01/23 1553   03/01/23 1400  vancomycin (VANCOCIN) IVPB 1000 mg/200 mL premix        1,000 mg 200 mL/hr over 60 Minutes Intravenous On call to O.R. 03/01/23 1337 03/01/23 1522        Patient was given sequential compression devices, early ambulation, and chemoprophylaxis to prevent DVT. Patient worked with PT and was meeting their goals regarding safe ambulation and transfers.  Patient benefited maximally  from hospital stay and there were no complications.    Recent vital signs: No data found.   Recent laboratory studies: No results for input(s): "WBC", "HGB", "HCT", "PLT", "NA", "K", "CL", "CO2", "BUN", "CREATININE", "GLUCOSE", "INR", "CALCIUM" in the last 72 hours.  Invalid input(s): "PT", "2"   Discharge Medications:   Allergies as of 03/02/2023       Reactions   Bee Venom Anaphylaxis   Feldene [piroxicam]    Flexeril [cyclobenzaprine] Nausea Only   Cephalosporins Rash   Keflex [cephalexin]  Rash        Medication List     STOP taking these medications    doxycycline 100 MG capsule Commonly known as: VIBRAMYCIN       TAKE these medications    ALPRAZolam 0.5 MG tablet Commonly known as: XANAX Take 0.5 mg by mouth at bedtime as needed for anxiety or sleep.   aspirin 81 MG chewable tablet Chew 1 tablet (81 mg total) by mouth 2 (two) times daily for 28 days.   celecoxib 200 MG capsule Commonly known as: CELEBREX Take 200 mg by mouth daily.   cholecalciferol 25 MCG (1000 UNIT) tablet Commonly known as: VITAMIN D3 Take 1,000 Units by mouth daily.   EPINEPHrine 0.3 mg/0.3 mL Soaj injection Commonly known as: EPI-PEN Inject 0.3 mg into the muscle as needed for anaphylaxis.   ferrous sulfate 325 (65 FE) MG EC tablet Take 325 mg by mouth daily.   FLUoxetine 20 MG capsule Commonly known as: PROZAC Take 20 mg by mouth daily.   furosemide 40 MG tablet Commonly known as: LASIX Take 40 mg by mouth daily as needed for edema.   latanoprost 0.005 % ophthalmic solution Commonly known as: XALATAN Place 1 drop into both eyes at bedtime.   levothyroxine 88 MCG tablet Commonly known as: SYNTHROID Take 88 mcg by mouth daily before breakfast.   omeprazole 20 MG capsule Commonly known as: PRILOSEC Take 20 mg by mouth daily.   oxyCODONE 5 MG immediate release tablet Commonly known as: Oxy IR/ROXICODONE Take 1-2 tablets (5-10 mg total) by mouth every 4 (four) hours as needed for severe pain. What changed: how much to take   Ozempic (2 MG/DOSE) 8 MG/3ML Sopn Generic drug: Semaglutide (2 MG/DOSE) Inject 2 mg into the skin once a week.   polyethylene glycol 17 g packet Commonly known as: MIRALAX / GLYCOLAX Take 17 g by mouth 2 (two) times daily.   pramipexole 1 MG tablet Commonly known as: MIRAPEX Take 2 mg by mouth 3 (three) times daily.   SUMAtriptan 100 MG tablet Commonly known as: IMITREX Take 100 mg by mouth every 2 (two) hours as needed for migraine.  May repeat in 2 hours if headache persists or recurs.   tiZANidine 2 MG tablet Commonly known as: ZANAFLEX Take 1 tablet (2 mg total) by mouth every 8 (eight) hours as needed for muscle spasms (muscle pain).               Discharge Care Instructions  (From admission, onward)           Start     Ordered   03/02/23 0000  Change dressing       Comments: Maintain surgical dressing until follow up in the clinic. If the edges start to pull up, may reinforce with tape. If the dressing is no longer working, may remove and cover with gauze and tape, but must keep the area dry and clean.  Call with any questions or concerns.  03/02/23 1509   03/02/23 0000  Change dressing       Comments: Maintain surgical dressing until follow up in the clinic. If the edges start to pull up, may reinforce with tape. If the dressing is no longer working, may remove and cover with gauze and tape, but must keep the area dry and clean.  Call with any questions or concerns.   03/02/23 1509            Diagnostic Studies: DG Pelvis Portable  Result Date: 03/01/2023 CLINICAL DATA:  Status post hip arthroplasty. EXAM: PORTABLE PELVIS 1-2 VIEWS COMPARISON:  None Available. FINDINGS: Left hip arthroplasty in expected alignment. No periprosthetic lucency or fracture. Recent postsurgical change includes air and edema in the soft tissues. IMPRESSION: Left hip arthroplasty without immediate postoperative complication. Electronically Signed   By: Narda Rutherford M.D.   On: 03/01/2023 17:44   DG HIP UNILAT WITH PELVIS 1V LEFT  Result Date: 03/01/2023 CLINICAL DATA:  Intraoperative total left hip arthroplasty. EXAM: DG HIP (WITH OR WITHOUT PELVIS) 1V*L* COMPARISON:  CT abdomen and pelvis 12/14/2020 FINDINGS: Images were performed intraoperatively without the presence of a radiologist. There is mild-to-moderate depression of the superomedial left femoral head. The patient is undergoing total left hip arthroplasty. No  hardware complication is seen. Total fluoroscopy images: 7 Total fluoroscopy time: 10 seconds Total dose: Radiation Exposure Index (as provided by the fluoroscopic device): 2.65 mGy air Kerma Please see intraoperative findings for further detail. IMPRESSION: Intraoperative fluoroscopy for total left hip arthroplasty. Electronically Signed   By: Neita Garnet M.D.   On: 03/01/2023 17:25   DG C-Arm 1-60 Min-No Report  Result Date: 03/01/2023 Fluoroscopy was utilized by the requesting physician.  No radiographic interpretation.   DG C-Arm 1-60 Min-No Report  Result Date: 03/01/2023 Fluoroscopy was utilized by the requesting physician.  No radiographic interpretation.    Disposition: Discharge disposition: 01-Home or Self Care       Discharge Instructions     Call MD / Call 911   Complete by: As directed    If you experience chest pain or shortness of breath, CALL 911 and be transported to the hospital emergency room.  If you develope a fever above 101 F, pus (white drainage) or increased drainage or redness at the wound, or calf pain, call your surgeon's office.   Change dressing   Complete by: As directed    Maintain surgical dressing until follow up in the clinic. If the edges start to pull up, may reinforce with tape. If the dressing is no longer working, may remove and cover with gauze and tape, but must keep the area dry and clean.  Call with any questions or concerns.   Change dressing   Complete by: As directed    Maintain surgical dressing until follow up in the clinic. If the edges start to pull up, may reinforce with tape. If the dressing is no longer working, may remove and cover with gauze and tape, but must keep the area dry and clean.  Call with any questions or concerns.   Constipation Prevention   Complete by: As directed    Drink plenty of fluids.  Prune juice may be helpful.  You may use a stool softener, such as Colace (over the counter) 100 mg twice a day.  Use MiraLax  (over the counter) for constipation as needed.   Diet - low sodium heart healthy   Complete by: As directed    Increase activity slowly as  tolerated   Complete by: As directed    Weight bearing as tolerated with assist device (walker, cane, etc) as directed, use it as long as suggested by your surgeon or therapist, typically at least 4-6 weeks.   Post-operative opioid taper instructions:   Complete by: As directed    POST-OPERATIVE OPIOID TAPER INSTRUCTIONS: It is important to wean off of your opioid medication as soon as possible. If you do not need pain medication after your surgery it is ok to stop day one. Opioids include: Codeine, Hydrocodone(Norco, Vicodin), Oxycodone(Percocet, oxycontin) and hydromorphone amongst others.  Long term and even short term use of opiods can cause: Increased pain response Dependence Constipation Depression Respiratory depression And more.  Withdrawal symptoms can include Flu like symptoms Nausea, vomiting And more Techniques to manage these symptoms Hydrate well Eat regular healthy meals Stay active Use relaxation techniques(deep breathing, meditating, yoga) Do Not substitute Alcohol to help with tapering If you have been on opioids for less than two weeks and do not have pain than it is ok to stop all together.  Plan to wean off of opioids This plan should start within one week post op of your joint replacement. Maintain the same interval or time between taking each dose and first decrease the dose.  Cut the total daily intake of opioids by one tablet each day Next start to increase the time between doses. The last dose that should be eliminated is the evening dose.      TED hose   Complete by: As directed    Use stockings (TED hose) for 2 weeks on both leg(s).  You may remove them at night for sleeping.   TED hose   Complete by: As directed    Use stockings (TED hose) for 2 weeks on both leg(s).  You may remove them at night for  sleeping.        Follow-up Information     Durene Romans, MD. Schedule an appointment as soon as possible for a visit in 2 week(s).   Specialty: Orthopedic Surgery Contact information: [address] [place] [address] [phone]                  Signed: Cassandria Anger 03/14/2023, 3:30 PM

## 2023-03-15 DIAGNOSIS — I340 Nonrheumatic mitral (valve) insufficiency: Secondary | ICD-10-CM | POA: Diagnosis not present

## 2023-03-16 ENCOUNTER — Inpatient Hospital Stay (HOSPITAL_COMMUNITY)
Admission: AD | Admit: 2023-03-16 | Discharge: 2023-03-21 | DRG: 467 | Disposition: A | Discharge status: Skilled Nursing Facility | Payer: Medicare Other | Source: Other Acute Inpatient Hospital | Attending: Internal Medicine | Admitting: Internal Medicine

## 2023-03-16 ENCOUNTER — Encounter (HOSPITAL_COMMUNITY): Payer: Self-pay

## 2023-03-16 DIAGNOSIS — K219 Gastro-esophageal reflux disease without esophagitis: Secondary | ICD-10-CM | POA: Diagnosis present

## 2023-03-16 DIAGNOSIS — Z79890 Hormone replacement therapy (postmenopausal): Secondary | ICD-10-CM | POA: Diagnosis not present

## 2023-03-16 DIAGNOSIS — Z8616 Personal history of COVID-19: Secondary | ICD-10-CM

## 2023-03-16 DIAGNOSIS — R2689 Other abnormalities of gait and mobility: Secondary | ICD-10-CM | POA: Diagnosis not present

## 2023-03-16 DIAGNOSIS — Z881 Allergy status to other antibiotic agents status: Secondary | ICD-10-CM | POA: Diagnosis not present

## 2023-03-16 DIAGNOSIS — F32A Depression, unspecified: Secondary | ICD-10-CM | POA: Diagnosis present

## 2023-03-16 DIAGNOSIS — T84031A Mechanical loosening of internal left hip prosthetic joint, initial encounter: Principal | ICD-10-CM | POA: Diagnosis present

## 2023-03-16 DIAGNOSIS — E782 Mixed hyperlipidemia: Secondary | ICD-10-CM | POA: Diagnosis present

## 2023-03-16 DIAGNOSIS — G43909 Migraine, unspecified, not intractable, without status migrainosus: Secondary | ICD-10-CM | POA: Diagnosis not present

## 2023-03-16 DIAGNOSIS — Z471 Aftercare following joint replacement surgery: Secondary | ICD-10-CM | POA: Diagnosis not present

## 2023-03-16 DIAGNOSIS — F419 Anxiety disorder, unspecified: Secondary | ICD-10-CM | POA: Diagnosis present

## 2023-03-16 DIAGNOSIS — Z79899 Other long term (current) drug therapy: Secondary | ICD-10-CM

## 2023-03-16 DIAGNOSIS — Z7985 Long-term (current) use of injectable non-insulin antidiabetic drugs: Secondary | ICD-10-CM

## 2023-03-16 DIAGNOSIS — G4730 Sleep apnea, unspecified: Secondary | ICD-10-CM | POA: Diagnosis not present

## 2023-03-16 DIAGNOSIS — Z96642 Presence of left artificial hip joint: Secondary | ICD-10-CM | POA: Diagnosis not present

## 2023-03-16 DIAGNOSIS — Z87891 Personal history of nicotine dependence: Secondary | ICD-10-CM | POA: Diagnosis not present

## 2023-03-16 DIAGNOSIS — F329 Major depressive disorder, single episode, unspecified: Secondary | ICD-10-CM | POA: Diagnosis not present

## 2023-03-16 DIAGNOSIS — Y831 Surgical operation with implant of artificial internal device as the cause of abnormal reaction of the patient, or of later complication, without mention of misadventure at the time of the procedure: Secondary | ICD-10-CM | POA: Diagnosis present

## 2023-03-16 DIAGNOSIS — L03116 Cellulitis of left lower limb: Secondary | ICD-10-CM | POA: Diagnosis present

## 2023-03-16 DIAGNOSIS — M9702XA Periprosthetic fracture around internal prosthetic left hip joint, initial encounter: Secondary | ICD-10-CM | POA: Diagnosis not present

## 2023-03-16 DIAGNOSIS — Z91030 Bee allergy status: Secondary | ICD-10-CM

## 2023-03-16 DIAGNOSIS — I10 Essential (primary) hypertension: Secondary | ICD-10-CM | POA: Diagnosis present

## 2023-03-16 DIAGNOSIS — T8452XD Infection and inflammatory reaction due to internal left hip prosthesis, subsequent encounter: Secondary | ICD-10-CM | POA: Diagnosis not present

## 2023-03-16 DIAGNOSIS — E039 Hypothyroidism, unspecified: Secondary | ICD-10-CM | POA: Diagnosis present

## 2023-03-16 DIAGNOSIS — Z7982 Long term (current) use of aspirin: Secondary | ICD-10-CM | POA: Diagnosis not present

## 2023-03-16 DIAGNOSIS — E871 Hypo-osmolality and hyponatremia: Secondary | ICD-10-CM | POA: Diagnosis present

## 2023-03-16 DIAGNOSIS — Z6838 Body mass index (BMI) 38.0-38.9, adult: Secondary | ICD-10-CM | POA: Diagnosis not present

## 2023-03-16 DIAGNOSIS — Z6841 Body Mass Index (BMI) 40.0 and over, adult: Secondary | ICD-10-CM | POA: Diagnosis not present

## 2023-03-16 DIAGNOSIS — G4733 Obstructive sleep apnea (adult) (pediatric): Secondary | ICD-10-CM | POA: Diagnosis present

## 2023-03-16 DIAGNOSIS — R2681 Unsteadiness on feet: Secondary | ICD-10-CM | POA: Diagnosis not present

## 2023-03-16 DIAGNOSIS — M7981 Nontraumatic hematoma of soft tissue: Secondary | ICD-10-CM | POA: Diagnosis present

## 2023-03-16 DIAGNOSIS — Z888 Allergy status to other drugs, medicaments and biological substances status: Secondary | ICD-10-CM

## 2023-03-16 DIAGNOSIS — D509 Iron deficiency anemia, unspecified: Secondary | ICD-10-CM | POA: Diagnosis present

## 2023-03-16 DIAGNOSIS — M25552 Pain in left hip: Secondary | ICD-10-CM | POA: Diagnosis not present

## 2023-03-16 DIAGNOSIS — T84091A Other mechanical complication of internal left hip prosthesis, initial encounter: Secondary | ICD-10-CM | POA: Diagnosis not present

## 2023-03-16 DIAGNOSIS — K5900 Constipation, unspecified: Secondary | ICD-10-CM | POA: Diagnosis not present

## 2023-03-16 DIAGNOSIS — E6609 Other obesity due to excess calories: Secondary | ICD-10-CM | POA: Diagnosis not present

## 2023-03-16 DIAGNOSIS — K5901 Slow transit constipation: Secondary | ICD-10-CM | POA: Diagnosis not present

## 2023-03-16 DIAGNOSIS — T84091D Other mechanical complication of internal left hip prosthesis, subsequent encounter: Secondary | ICD-10-CM | POA: Diagnosis not present

## 2023-03-16 DIAGNOSIS — M810 Age-related osteoporosis without current pathological fracture: Secondary | ICD-10-CM | POA: Diagnosis not present

## 2023-03-16 MED ORDER — VANCOMYCIN HCL 1500 MG/300ML IV SOLN
1500.0000 mg | INTRAVENOUS | Status: DC
  Administered 2023-03-17 – 2023-03-20 (×4): 1500 mg via INTRAVENOUS
  Filled 2023-03-16 (×5): qty 300

## 2023-03-16 MED ORDER — VANCOMYCIN HCL 2000 MG/400ML IV SOLN
2000.0000 mg | Freq: Once | INTRAVENOUS | Status: DC
  Filled 2023-03-16: qty 400

## 2023-03-16 MED ORDER — HYDROMORPHONE HCL 1 MG/ML IJ SOLN
0.5000 mg | INTRAMUSCULAR | Status: DC | PRN
  Administered 2023-03-17 (×5): 0.5 mg via INTRAVENOUS
  Filled 2023-03-16 (×5): qty 0.5

## 2023-03-16 MED ORDER — ACETAMINOPHEN 325 MG PO TABS
650.0000 mg | ORAL_TABLET | Freq: Four times a day (QID) | ORAL | Status: DC | PRN
  Filled 2023-03-16: qty 2

## 2023-03-16 MED ORDER — POTASSIUM CHLORIDE IN NACL 20-0.9 MEQ/L-% IV SOLN
INTRAVENOUS | Status: DC
  Filled 2023-03-16 (×4): qty 1000

## 2023-03-16 NOTE — Progress Notes (Signed)
Pharmacy Antibiotic Note  Kendra [name] is a 77 y.o. female admitted on 03/16/2023 with infected joint.  Pharmacy has been consulted for vancomycin dosing.  Plan: Vancomycin 1500mg  q24h (AUC 478.6, Scr 0.8) Follow renal function, cultures and clinical course  Weight: 113.4 kg (250 lb)  Temp (24hrs), Avg:98.1 F (36.7 C), Min:98.1 F (36.7 C), Max:98.1 F (36.7 C)  No results for input(s): "WBC", "CREATININE", "LATICACIDVEN", "VANCOTROUGH", "VANCOPEAK", "VANCORANDOM", "GENTTROUGH", "GENTPEAK", "GENTRANDOM", "TOBRATROUGH", "TOBRAPEAK", "TOBRARND", "AMIKACINPEAK", "AMIKACINTROU", "AMIKACIN" in the last 168 hours.  Estimated Creatinine Clearance: 80.3 mL/min (by C-G formula based on SCr of 0.5 mg/dL).    Allergies  Allergen Reactions   Bee Venom Anaphylaxis   Feldene [Piroxicam]    Flexeril [Cyclobenzaprine] Nausea Only   Cephalosporins Rash   Keflex [Cephalexin] Rash    Antimicrobials this admission:  Dose adjustments this admission:   Microbiology results:   Thank you for allowing pharmacy to be a part of this patient's care.  Arley Phenix RPh 03/16/2023, 11:55 PM

## 2023-03-17 ENCOUNTER — Encounter (HOSPITAL_COMMUNITY): Payer: Self-pay | Admitting: Family Medicine

## 2023-03-17 ENCOUNTER — Inpatient Hospital Stay (HOSPITAL_COMMUNITY): Payer: Medicare Other

## 2023-03-17 ENCOUNTER — Other Ambulatory Visit: Payer: Self-pay

## 2023-03-17 DIAGNOSIS — G4730 Sleep apnea, unspecified: Secondary | ICD-10-CM

## 2023-03-17 DIAGNOSIS — M25552 Pain in left hip: Principal | ICD-10-CM | POA: Diagnosis present

## 2023-03-17 LAB — CBC WITH DIFFERENTIAL/PLATELET
Abs Immature Granulocytes: 0.16 10*3/uL — ABNORMAL HIGH (ref 0.00–0.07)
Abs Immature Granulocytes: 0.18 10*3/uL — ABNORMAL HIGH (ref 0.00–0.07)
Basophils Absolute: 0 10*3/uL (ref 0.0–0.1)
Basophils Absolute: 0 10*3/uL (ref 0.0–0.1)
Basophils Relative: 0 %
Basophils Relative: 0 %
Eosinophils Absolute: 0.1 10*3/uL (ref 0.0–0.5)
Eosinophils Absolute: 0.1 10*3/uL (ref 0.0–0.5)
Eosinophils Relative: 1 %
Eosinophils Relative: 1 %
HCT: 30.8 % — ABNORMAL LOW (ref 36.0–46.0)
HCT: 28 % — ABNORMAL LOW (ref 36.0–46.0)
Hemoglobin: 9.7 g/dL — ABNORMAL LOW (ref 12.0–15.0)
Hemoglobin: 8.8 g/dL — ABNORMAL LOW (ref 12.0–15.0)
Immature Granulocytes: 2 %
Immature Granulocytes: 2 %
Lymphocytes Relative: 15 %
Lymphocytes Relative: 12 %
Lymphs Abs: 1.4 10*3/uL (ref 0.7–4.0)
Lymphs Abs: 1.1 10*3/uL (ref 0.7–4.0)
MCH: 30.7 pg (ref 26.0–34.0)
MCH: 30.6 pg (ref 26.0–34.0)
MCHC: 31.5 g/dL (ref 30.0–36.0)
MCHC: 31.4 g/dL (ref 30.0–36.0)
MCV: 97.5 fL (ref 80.0–100.0)
MCV: 97.2 fL (ref 80.0–100.0)
Monocytes Absolute: 0.9 10*3/uL (ref 0.1–1.0)
Monocytes Absolute: 1 10*3/uL (ref 0.1–1.0)
Monocytes Relative: 9 %
Monocytes Relative: 10 %
Neutro Abs: 7.1 10*3/uL (ref 1.7–7.7)
Neutro Abs: 6.8 10*3/uL (ref 1.7–7.7)
Neutrophils Relative %: 73 %
Neutrophils Relative %: 75 %
Platelets: 266 10*3/uL (ref 150–400)
Platelets: 269 10*3/uL (ref 150–400)
RBC: 3.16 MIL/uL — ABNORMAL LOW (ref 3.87–5.11)
RBC: 2.88 MIL/uL — ABNORMAL LOW (ref 3.87–5.11)
RDW: 15.4 % (ref 11.5–15.5)
RDW: 15.5 % (ref 11.5–15.5)
WBC: 9.7 10*3/uL (ref 4.0–10.5)
WBC: 9.3 10*3/uL (ref 4.0–10.5)
nRBC: 0 % (ref 0.0–0.2)
nRBC: 0 % (ref 0.0–0.2)

## 2023-03-17 LAB — BASIC METABOLIC PANEL
Anion gap: 8 (ref 5–15)
BUN: 8 mg/dL (ref 8–23)
CO2: 26 mmol/L (ref 22–32)
Calcium: 8.2 mg/dL — ABNORMAL LOW (ref 8.9–10.3)
Chloride: 98 mmol/L (ref 98–111)
Creatinine, Ser: 0.42 mg/dL — ABNORMAL LOW (ref 0.44–1.00)
GFR, Estimated: >60 mL/min (ref >60)
Glucose, Bld: 109 mg/dL — ABNORMAL HIGH (ref 70–99)
Potassium: 3.5 mmol/L (ref 3.5–5.1)
Sodium: 132 mmol/L — ABNORMAL LOW (ref 135–145)

## 2023-03-17 LAB — SEDIMENTATION RATE: Sed Rate: 40 mm/hr — ABNORMAL HIGH (ref 0–22)

## 2023-03-17 LAB — COMPREHENSIVE METABOLIC PANEL
ALT: 11 U/L (ref 0–44)
AST: 12 U/L — ABNORMAL LOW (ref 15–41)
Albumin: 3.2 g/dL — ABNORMAL LOW (ref 3.5–5.0)
Alkaline Phosphatase: 80 U/L (ref 38–126)
Anion gap: 9 (ref 5–15)
BUN: 9 mg/dL (ref 8–23)
CO2: 26 mmol/L (ref 22–32)
Calcium: 8.6 mg/dL — ABNORMAL LOW (ref 8.9–10.3)
Chloride: 99 mmol/L (ref 98–111)
Creatinine, Ser: 0.44 mg/dL (ref 0.44–1.00)
GFR, Estimated: >60 mL/min (ref >60)
Glucose, Bld: 117 mg/dL — ABNORMAL HIGH (ref 70–99)
Potassium: 3.5 mmol/L (ref 3.5–5.1)
Sodium: 134 mmol/L — ABNORMAL LOW (ref 135–145)
Total Bilirubin: 1.2 mg/dL (ref 0.3–1.2)
Total Protein: 6 g/dL — ABNORMAL LOW (ref 6.5–8.1)

## 2023-03-17 LAB — HEMOGLOBIN A1C
Hgb A1c MFr Bld: 4.9 % (ref 4.8–5.6)
Mean Plasma Glucose: 93.93 mg/dL

## 2023-03-17 LAB — PROTIME-INR
INR: 1.3 — ABNORMAL HIGH (ref 0.8–1.2)
Prothrombin Time: 16.6 s — ABNORMAL HIGH (ref 11.4–15.2)

## 2023-03-17 LAB — GLUCOSE, CAPILLARY
Glucose-Capillary: 95 mg/dL (ref 70–99)
Glucose-Capillary: 110 mg/dL — ABNORMAL HIGH (ref 70–99)
Glucose-Capillary: 185 mg/dL — ABNORMAL HIGH (ref 70–99)

## 2023-03-17 LAB — FOLATE: Folate: 9 ng/mL (ref >5.9)

## 2023-03-17 LAB — VITAMIN D 25 HYDROXY (VIT D DEFICIENCY, FRACTURES): Vit D, 25-Hydroxy: 41.92 ng/mL (ref 30–100)

## 2023-03-17 LAB — IRON AND TIBC
Iron: 19 ug/dL — ABNORMAL LOW (ref 28–170)
Saturation Ratios: 7 % — ABNORMAL LOW (ref 10.4–31.8)
TIBC: 276 ug/dL (ref 250–450)
UIBC: 257 ug/dL

## 2023-03-17 LAB — C-REACTIVE PROTEIN: CRP: 14.4 mg/dL — ABNORMAL HIGH (ref <1.0)

## 2023-03-17 LAB — VITAMIN B12: Vitamin B-12: 506 pg/mL (ref 180–914)

## 2023-03-17 LAB — MAGNESIUM: Magnesium: 2.1 mg/dL (ref 1.7–2.4)

## 2023-03-17 MED ORDER — ADULT MULTIVITAMIN W/MINERALS CH
1.0000 | ORAL_TABLET | Freq: Every day | ORAL | Status: DC
  Administered 2023-03-17 – 2023-03-21 (×4): 1 via ORAL
  Filled 2023-03-17 (×4): qty 1

## 2023-03-17 MED ORDER — ENOXAPARIN SODIUM 40 MG/0.4ML IJ SOSY: 40.0000 mg | PREFILLED_SYRINGE | INTRAMUSCULAR | Status: DC

## 2023-03-17 MED ORDER — PIPERACILLIN-TAZOBACTAM 3.375 G IVPB
3.3750 g | Freq: Three times a day (TID) | INTRAVENOUS | Status: DC
  Administered 2023-03-17 – 2023-03-21 (×13): 3.375 g via INTRAVENOUS
  Filled 2023-03-17 (×13): qty 50

## 2023-03-17 MED ORDER — FUROSEMIDE 10 MG/ML IJ SOLN
20.0000 mg | Freq: Every day | INTRAMUSCULAR | Status: AC
  Administered 2023-03-17 – 2023-03-19 (×3): 20 mg via INTRAVENOUS
  Filled 2023-03-17 (×3): qty 2

## 2023-03-17 MED ORDER — ENOXAPARIN SODIUM 40 MG/0.4ML IJ SOSY
40.0000 mg | PREFILLED_SYRINGE | Freq: Every evening | INTRAMUSCULAR | Status: DC
Start: 2023-03-17 — End: 2023-03-17

## 2023-03-17 MED ORDER — POLYSACCHARIDE IRON COMPLEX 150 MG PO CAPS
150.0000 mg | ORAL_CAPSULE | Freq: Every day | ORAL | Status: DC
  Administered 2023-03-17 – 2023-03-21 (×4): 150 mg via ORAL
  Filled 2023-03-17 (×4): qty 1

## 2023-03-17 MED ORDER — KETOROLAC TROMETHAMINE 15 MG/ML IJ SOLN
15.0000 mg | Freq: Four times a day (QID) | INTRAMUSCULAR | Status: AC
  Administered 2023-03-17 – 2023-03-18 (×3): 15 mg via INTRAVENOUS
  Filled 2023-03-17 (×3): qty 1

## 2023-03-17 MED ORDER — PIPERACILLIN-TAZOBACTAM 3.375 G IVPB: 3.3750 g | Freq: Four times a day (QID) | INTRAVENOUS | Status: DC

## 2023-03-17 MED ORDER — VITAMIN C 500 MG PO TABS
500.0000 mg | ORAL_TABLET | Freq: Every day | ORAL | Status: DC
  Administered 2023-03-18 – 2023-03-21 (×3): 500 mg via ORAL
  Filled 2023-03-17 (×3): qty 1

## 2023-03-17 MED ORDER — THIAMINE MONONITRATE 100 MG PO TABS
100.0000 mg | ORAL_TABLET | Freq: Every day | ORAL | Status: DC
  Administered 2023-03-17 – 2023-03-21 (×3): 100 mg via ORAL
  Filled 2023-03-17 (×4): qty 1

## 2023-03-17 MED ORDER — SENNOSIDES-DOCUSATE SODIUM 8.6-50 MG PO TABS: 1.0000 | ORAL_TABLET | Freq: Every evening | ORAL | Status: DC | PRN

## 2023-03-17 MED ORDER — FLUOXETINE HCL 20 MG PO CAPS
20.0000 mg | ORAL_CAPSULE | Freq: Every day | ORAL | Status: DC
  Administered 2023-03-17 – 2023-03-21 (×4): 20 mg via ORAL
  Filled 2023-03-17 (×4): qty 1

## 2023-03-17 MED ORDER — LEVOTHYROXINE SODIUM 88 MCG PO TABS
88.0000 ug | ORAL_TABLET | Freq: Every day | ORAL | Status: DC
  Administered 2023-03-17 – 2023-03-21 (×5): 88 ug via ORAL
  Filled 2023-03-17 (×5): qty 1

## 2023-03-17 MED ORDER — FOLIC ACID 1 MG PO TABS
1.0000 mg | ORAL_TABLET | Freq: Every day | ORAL | Status: DC
  Administered 2023-03-17 – 2023-03-21 (×4): 1 mg via ORAL
  Filled 2023-03-17 (×4): qty 1

## 2023-03-17 MED ORDER — PRAMIPEXOLE DIHYDROCHLORIDE 1 MG PO TABS
2.0000 mg | ORAL_TABLET | Freq: Three times a day (TID) | ORAL | Status: DC
  Administered 2023-03-17 – 2023-03-21 (×11): 2 mg via ORAL
  Filled 2023-03-17 (×14): qty 2
  Filled 2023-03-17: qty 8

## 2023-03-17 MED ORDER — THIAMINE HCL 100 MG/ML IJ SOLN
100.0000 mg | Freq: Every day | INTRAMUSCULAR | Status: DC
  Administered 2023-03-18: 100 mg via INTRAVENOUS
  Filled 2023-03-17 (×2): qty 2

## 2023-03-17 MED ORDER — POLYETHYLENE GLYCOL 3350 17 G PO PACK
17.0000 g | PACK | Freq: Two times a day (BID) | ORAL | Status: DC
  Administered 2023-03-18 – 2023-03-19 (×3): 17 g via ORAL
  Filled 2023-03-17 (×4): qty 1

## 2023-03-17 MED ORDER — ACETAMINOPHEN 325 MG PO TABS
650.0000 mg | ORAL_TABLET | Freq: Four times a day (QID) | ORAL | Status: DC | PRN
  Administered 2023-03-18: 650 mg via ORAL

## 2023-03-17 MED ORDER — BISACODYL 10 MG RE SUPP
10.0000 mg | Freq: Every day | RECTAL | Status: DC | PRN
  Filled 2023-03-17: qty 1

## 2023-03-17 MED ORDER — MELATONIN 5 MG PO TABS
5.0000 mg | ORAL_TABLET | Freq: Every day | ORAL | Status: DC
  Administered 2023-03-17: 5 mg via ORAL
  Filled 2023-03-17: qty 1

## 2023-03-17 MED ORDER — BISACODYL 5 MG PO TBEC
10.0000 mg | DELAYED_RELEASE_TABLET | Freq: Every day | ORAL | Status: DC
  Administered 2023-03-17 – 2023-03-18 (×2): 10 mg via ORAL
  Filled 2023-03-17 (×2): qty 2

## 2023-03-17 MED ORDER — OXYCODONE HCL 5 MG PO TABS
5.0000 mg | ORAL_TABLET | ORAL | Status: DC | PRN
  Administered 2023-03-17 – 2023-03-20 (×14): 10 mg via ORAL
  Filled 2023-03-17 (×14): qty 2

## 2023-03-17 MED ORDER — PANTOPRAZOLE SODIUM 40 MG PO TBEC
40.0000 mg | DELAYED_RELEASE_TABLET | Freq: Every day | ORAL | Status: DC
  Administered 2023-03-18 – 2023-03-21 (×4): 40 mg via ORAL
  Filled 2023-03-17 (×4): qty 1

## 2023-03-17 MED ORDER — LORAZEPAM 1 MG PO TABS
1.0000 mg | ORAL_TABLET | ORAL | Status: AC | PRN
  Administered 2023-03-19: 1 mg via ORAL
  Filled 2023-03-17: qty 1

## 2023-03-17 MED ORDER — ACETAMINOPHEN 650 MG RE SUPP: 650.0000 mg | Freq: Four times a day (QID) | RECTAL | Status: DC | PRN

## 2023-03-17 MED ORDER — INSULIN ASPART 100 UNIT/ML IJ SOLN: 0.0000 [IU] | Freq: Three times a day (TID) | INTRAMUSCULAR | Status: DC

## 2023-03-17 MED ORDER — ALPRAZOLAM 0.5 MG PO TABS
0.5000 mg | ORAL_TABLET | Freq: Every evening | ORAL | Status: DC | PRN
  Administered 2023-03-17 – 2023-03-20 (×4): 0.5 mg via ORAL
  Filled 2023-03-17 (×4): qty 1

## 2023-03-17 MED ORDER — LORAZEPAM 2 MG/ML IJ SOLN
1.0000 mg | INTRAMUSCULAR | Status: AC | PRN
  Administered 2023-03-17: 2 mg via INTRAVENOUS
  Filled 2023-03-17: qty 1

## 2023-03-17 MED ORDER — PREDNISONE 20 MG PO TABS
40.0000 mg | ORAL_TABLET | Freq: Every day | ORAL | Status: AC
  Administered 2023-03-17 – 2023-03-19 (×3): 40 mg via ORAL
  Filled 2023-03-17 (×3): qty 2

## 2023-03-17 NOTE — Progress Notes (Signed)
   03/17/23 0125  BiPAP/CPAP/SIPAP  BiPAP/CPAP/SIPAP Pt Type Adult  BiPAP/CPAP/SIPAP Resmed  Mask Type Nasal pillows  EPAP 13 cmH2O  FiO2 (%) 21 %  Heater Temperature  (sterile water added to humidifier)  Patient Home Equipment Yes  Auto Titrate No  Safety Check Completed by RT for Home Unit Yes, no issues noted (cpap plugged into red outlet)  BiPAP/CPAP /SiPAP Vitals  Pulse Rate 71  Resp 16  SpO2 92 %

## 2023-03-17 NOTE — Progress Notes (Signed)
Patient ID: Kendra [name], female   DOB: 10-19-1945, 77 y.o.   MRN: [id number]  POD #16 s/p left THR  Admitted for pain control and significant BLE edema and erythema  Complains of not only left hip pain but also pain in her wrists, ankles, knees  Complains of pain with mobility  Reports a popping sensation in her hip with mobility during her post op course She has had a CT scan at [facility] ER with no reports of fracture or dislocation - not available to review, but reports of fluid collection around hip 2 weeks post op  Exam: Significant BLE edema with erythema to mid leg, no streaking cellulitic concerns  Left hip exam reveal healed incision without drainage or erythema  Hgb - 8.8 Cr - 4.2  Plan: Will give IV lasix for 4 days to improve edema Will start Toradol 15 mg for 6 doses to help with generalized pain  Plain film of her hip ordered, will review to make sure no periprosthetic concerns.  If none will work with PT for mobility

## 2023-03-17 NOTE — Progress Notes (Signed)
   03/17/23 1936  BiPAP/CPAP/SIPAP  BiPAP/CPAP/SIPAP Pt Type Adult  BiPAP/CPAP/SIPAP Resmed  Mask Type Nasal pillows  EPAP 13 cmH2O  FiO2 (%) 21 %  Heater Temperature  (sterile water added to humidifier)  Patient Home Equipment Yes  Auto Titrate No  Safety Check Completed by RT for Home Unit Yes, no issues noted (machine plugged into red port)  BiPAP/CPAP /SiPAP Vitals  Pulse Rate 85  Resp 16  SpO2 92 %

## 2023-03-17 NOTE — Plan of Care (Signed)
  Problem: Education: Goal: Knowledge of General Education information will improve Description: Including pain rating scale, medication(s)/side effects and non-pharmacologic comfort measures Outcome: Progressing   Problem: Clinical Measurements: Goal: Ability to maintain clinical measurements within normal limits will improve Outcome: Progressing   Problem: Pain Managment: Goal: General experience of comfort will improve Outcome: Progressing   

## 2023-03-17 NOTE — Plan of Care (Signed)

## 2023-03-17 NOTE — TOC Progression Note (Signed)
Transition of Care [facility]) - Progression Note   Patient Details  Name: Kendra [name] MRN: [id number] Date of Birth: 1945-12-20  [facility]) CM/SW Contact  Ewing Schlein, LCSW Phone Number: 03/17/2023, 2:00 PM  Clinical Narrative: CSW notified by Kennith Center in admissions at [facility] that the facility is holding a bed for her after discharge.   Expected Discharge Plan: Skilled Nursing Facility Barriers to Discharge: Continued Medical Work up  Expected Discharge Plan and Services Living arrangements for the past 2 months: Single Family Home            DME Arranged: N/A DME Agency: NA  Social Determinants of Health (SDOH) Interventions SDOH Screenings   Food Insecurity: No Food Insecurity (03/17/2023)  Housing: Low Risk  (03/17/2023)  Transportation Needs: No Transportation Needs (03/17/2023)  Utilities: Not At Risk (03/17/2023)  Financial Resource Strain: Low Risk  (12/22/2020)   Received from [facility] visits prior to 09/25/2022., [facility] visits prior to 09/25/2022.  Physical Activity: Unknown (12/22/2020)   Received from [facility] visits prior to 09/25/2022., [facility] visits prior to 09/25/2022.  Social Connections: Moderately Integrated (12/22/2020)   Received from [facility] visits prior to 09/25/2022., [facility] visits prior to 09/25/2022.  Stress: Stress Concern Present (12/22/2020)   Received from [facility] visits prior to 09/25/2022., [facility] visits prior to 09/25/2022.  Tobacco Use: Medium Risk (03/17/2023)   Readmission Risk Interventions     No data to display

## 2023-03-17 NOTE — Progress Notes (Signed)
[facility] Progress Note  Patient: Kendra [name]    VZD:[id number]  DOA: 03/16/2023     Date of Service: the patient was seen and examined on 03/17/2023  No chief complaint on file.  Brief hospital course: 77 year old female transferred from [facility].  Her past medical history includes hypothyroidism, depression, sleep apnea, morbid obesity, GERD.  On 03/01/2023 patient underwent left hip total replacement.  Per patient her leg has been swollen since but otherwise she did well until Thursday.  On Thursday she tried to get up from a table, felt a sharp pain in her left hip that she had never had before.  The pain has persisted, with no improvement.  Ortho was contacted, the thought was this was normal postop pain and swelling.  A friend stopped by thought she had cellulitis, to patient's [facility] ER.  Patient admitted Monday.   At [facility] Doppler study done was negative for DVT.  The patient was treated for cellulitis with IV vancomycin.  Yesterday she developed a low-grade fever Tmax 100.4.  Her WBC is increased from 8.2 to 11.2.  CT pelvis done showed left hip fluid collection seroma vs hematoma.  Orthopedics Dr. Orlan Leavens contacted, recommended patient transferred to [facility].   At [facility] patient's vitals are stable, afebrile.  WBC 9.3.  Blood cultures redrawn.  Patient still with significant left hip pain.  Patient complains of a soft swelling left elbow.  Presented since June.  She wondered if it could be drained.  Assessment and Plan:  # Left hip fluid collection  d/d seroma vs bleeding versus abscess S/p left total hip replacement Patient presented with pain and swelling in the left lower extremity Continue empiric antibiotics vancomycin and Zosyn Orthopedic surgery consulted, recommended IV Lasix for 4 days for edema and IV Toradol for pain and plan film of hip to make sure no periprosthetic concerns then patient can work with PT. Continue as needed pain meds Started prednisone 40 mg  p.o. daily for 3 days due to pain in hand joints and multiple other joints PPI for GI prophylaxis   # Cellulitis, left lower extremity Continue vancomycin and Zosyn Pharmacy consulted for vancomycin dosing and trough monitoring Follow blood cultures De-escalate antibiotics as per improvement  # Right elbow seroma -Does not appear infected.  X-ray ordered.  To be discussed with Ortho, inpt vs outpt evaluation.  # Hyponatremia, monitor sodium level daily Na 132  # Iron deficiency anemia, transferrin sat on 7% Avoided IV iron secondary to cellulitis Started oral iron supplement with vitamin C B12 and folate within normal range Follow with PCP to repeat iron profile after 3 to 6 months Monitor H&H  # Anxiety and depression -Patient q. hours sleep Xanax resumed. -Fluoxetine resumed  # Hypothyroid, continue Synthroid # Obesity: On Ozempic # OSA, continue CPAP qhs # Alcohol use -Patient states she drinks half bottle nightly. -CIWA initiated.  No clear evidence of withdrawal.  Last drink on Sunday.  Constipation, continue laxatives  Body mass index is 38.01 kg/m.  Interventions:  Diet: Regular diet DVT Prophylaxis: SCD, pharmacological prophylaxis contraindicated due to risk of postop bleeding    Advance goals of care discussion: Full code  Family Communication: family was present at bedside, at the time of interview.  The pt provided permission to discuss medical plan with the family. Opportunity was given to ask question and all questions were answered satisfactorily.   Disposition:  Pt is from home, admitted with left hip fluid collection in the left  lower extremity cellulitis, still on IV antibiotics, which precludes a safe discharge. Discharge to SNF, when stable.  Subjective: No significant events overnight, patient is still having left lower extremity pain and complaining of feeling swelling and pain in the all the joints especially hand joints. Patient denied any  abdominal pain, no chest pain or palpitation, no shortness of breath.  Physical Exam: General: NAD, lying comfortably Appear in no distress, affect appropriate Eyes: PERRLA ENT: Oral Mucosa Clear, moist  Neck: no JVD,  Cardiovascular: S1 and S2 Present, no Murmur,  Respiratory: good respiratory effort, Bilateral Air entry equal and Decreased, no Crackles, no wheezes Abdomen: Bowel Sound present, Soft and no tenderness,  Skin: no rashes Extremities: LLE erythema and tenderness, 3-4+ edema, left thigh mild erythema and tenderness. No calf tenderness, normal RLE Neurologic: without any new focal findings Gait not checked due to patient safety concerns  Vitals:   03/17/23 0337 03/17/23 0627 03/17/23 0949 03/17/23 1419  BP:  125/72 126/62 133/62  Pulse:  74 78 89  Resp:  15 16 17   Temp:  98.3 F (36.8 C) 99.1 F (37.3 C) 99.7 F (37.6 C)  TempSrc:  Oral Oral   SpO2:  93% 92% 93%  Weight:      Height: 5\' 8"  (1.727 m)       Intake/Output Summary (Last 24 hours) at 03/17/2023 1539 Last data filed at 03/17/2023 1034 Gross per 24 hour  Intake 694.58 ml  Output --  Net 694.58 ml   Filed Weights   03/16/23 2300  Weight: 113.4 kg    Data Reviewed: I have personally reviewed and interpreted daily labs, tele strips, imagings as discussed above. I reviewed all nursing notes, pharmacy notes, vitals, pertinent old records I have discussed plan of care as described above with RN and patient/family.  CBC: Recent Labs  Lab 03/17/23 0106 03/17/23 0359  WBC 9.7 9.3  NEUTROABS 7.1 6.8  HGB 9.7* 8.8*  HCT 30.8* 28.0*  MCV 97.5 97.2  PLT 266 269   Basic Metabolic Panel: Recent Labs  Lab 03/17/23 0106 03/17/23 0359  NA 134* 132*  K 3.5 3.5  CL 99 98  CO2 26 26  GLUCOSE 117* 109*  BUN 9 8  CREATININE 0.44 0.42*  CALCIUM 8.6* 8.2*  MG 2.1  --     Studies: No results found.  Scheduled Meds:  FLUoxetine  20 mg Oral Daily   folic acid  1 mg Oral Daily   furosemide  20  mg Intravenous Daily   insulin aspart  0-15 Units Subcutaneous TID WC   ketorolac  15 mg Intravenous Q6H   levothyroxine  88 mcg Oral Q0600   multivitamin with minerals  1 tablet Oral Daily   pramipexole  2 mg Oral TID   thiamine  100 mg Oral Daily   Or   thiamine  100 mg Intravenous Daily   Continuous Infusions:  0.9 % NaCl with KCl 20 mEq / L 75 mL/hr at 03/17/23 0108   piperacillin-tazobactam (ZOSYN)  IV 3.375 g (03/17/23 1034)   vancomycin     PRN Meds: acetaminophen **OR** acetaminophen, acetaminophen, ALPRAZolam, HYDROmorphone (DILAUDID) injection, LORazepam **OR** LORazepam, oxyCODONE, senna-docusate  Time spent: 55 minutes  Author: Gillis Santa. MD [facility] 03/17/2023 3:39 PM  To reach On-call, see care teams to locate the attending and reach out to them via [email or web address]. If 7PM-7AM, please contact night-coverage If you still have difficulty reaching the attending provider, please page the [facility] (Director on Call) for  [facility] on amion for assistance.

## 2023-03-17 NOTE — H&P (Addendum)
PCP:   Krystal Clark, NP   Chief Complaint:  Left hip pain.  HPI: This is a 77 year old female transferred from [facility].  Her past medical history includes hypothyroidism, depression, sleep apnea, morbid obesity, GERD.  On 03/01/2023 patient underwent left hip total replacement.  Per patient her leg has been swollen since but otherwise she did well until Thursday.  On Thursday she tried to get up from a table, felt a sharp pain in her left hip that she had never had before.  The pain has persisted, with no improvement.  Ortho was contacted, the thought was this was normal postop pain and swelling.  A friend stopped by thought she had cellulitis, to patient's [facility] ER.  Patient admitted Monday.  At [facility] Doppler study done was negative for DVT.  The patient was treated for cellulitis with IV vancomycin.  Yesterday she developed a low-grade fever Tmax 100.4.  Her WBC is increased from 8.2 to 11.2.  CT pelvis done showed left hip fluid collection seroma vs hematoma.  Orthopedics Dr. Orlan Leavens contacted, recommended patient transferred to [facility].  At [facility] patient's vitals are stable, afebrile.  WBC 9.3.  Blood cultures redrawn.  Patient still with significant left hip pain.  Patient complains of a soft swelling left elbow.  Presented since June.  She wondered if it could be drained.  Review of Systems:  Per HPI  Past Medical History: Past Medical History:  Diagnosis Date   Anemia    Atrophic vaginitis 12/26/2019   Formatting of this note might be different from the original. Follows along with urology uses bioidentical hormone   Benign essential hypertension 10/08/2015   Chest pain in adult 07/02/2015   Formatting of this note might be different from the original. 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 L3/4 7.2 mm with L4/5 mod to severe spinal stenosis, L5/S1 with slight disc on the right Formatting of this note might be different from the original. Overview:  L3/4 7.2 mm with L4/5 mod to severe spinal stenosis, L5/S1 with slight disc on the right   Valvular heart disease 07/15/2021   Past Surgical History:  Procedure Laterality Date   ABDOMINAL HYSTERECTOMY     partial   CATARACT EXTRACTION Bilateral    CESAREAN SECTION     x2   RHINOPLASTY  SPINAL CORD STIMULATOR INSERTION N/A 12/11/2020   Procedure: SPINAL CORD STIMULATOR INSERTION;  Surgeon: Venita Lick, MD;  Location: [facility] OR;  Service: Orthopedics;  Laterality: N/A;   TONSILLECTOMY     TOTAL HIP ARTHROPLASTY Left 03/01/2023   Procedure: TOTAL HIP ARTHROPLASTY ANTERIOR APPROACH;  Surgeon: Durene Romans, MD;  Location: [facility] ORS;  Service: Orthopedics;  Laterality: Left;    Medications: Prior to Admission medications   Medication Sig Start Date End Date Taking? Authorizing Provider  ALPRAZolam Prudy Feeler) 0.5 MG tablet Take 0.5 mg by mouth at bedtime as needed for anxiety or  sleep.    [provider]  aspirin 81 MG chewable tablet Chew 1 tablet (81 mg total) by mouth 2 (two) times daily for 28 days. 03/02/23 03/30/23  Cassandria Anger, PA-C  celecoxib (CELEBREX) 200 MG capsule Take 200 mg by mouth daily. 05/05/22   [provider]  cholecalciferol (VITAMIN D3) 25 MCG (1000 UNIT) tablet Take 1,000 Units by mouth daily.    [provider]  EPINEPHrine 0.3 mg/0.3 mL IJ SOAJ injection Inject 0.3 mg into the muscle as needed for anaphylaxis. 03/01/22   [provider]  ferrous sulfate 325 (65 FE) MG EC tablet Take 325 mg by mouth daily.    [provider]  FLUoxetine (PROZAC) 20 MG capsule Take 20 mg by mouth daily. 10/09/20   [provider]  furosemide (LASIX) 40 MG tablet Take 40 mg by mouth daily as needed for edema.    [provider]  latanoprost (XALATAN) 0.005 % ophthalmic solution Place 1 drop into both eyes at bedtime. 08/03/22   [provider]  levothyroxine (SYNTHROID) 88 MCG tablet Take 88 mcg by mouth daily before breakfast.    [provider]  omeprazole (PRILOSEC) 20 MG capsule Take 20 mg by mouth daily.    [provider]  oxyCODONE (OXY IR/ROXICODONE) 5 MG immediate release tablet Take 1-2 tablets (5-10 mg total) by mouth every 4 (four) hours as needed for severe pain. 03/02/23   Cassandria Anger, PA-C  polyethylene glycol (MIRALAX / GLYCOLAX) 17 g packet Take 17 g by mouth 2 (two) times daily. 03/02/23   Cassandria Anger, PA-C  pramipexole (MIRAPEX) 1 MG tablet Take 2 mg by mouth 3 (three) times daily.    [provider]  Semaglutide, 2 MG/DOSE, (OZEMPIC, 2 MG/DOSE,) 8 MG/3ML SOPN Inject 2 mg into the skin once a week. 12/07/22     SUMAtriptan (IMITREX) 100 MG tablet Take 100 mg by mouth every 2 (two) hours as needed for migraine. May repeat in 2 hours if headache persists or recurs.    [provider]  tiZANidine (ZANAFLEX) 2 MG tablet Take 1 tablet (2 mg  total) by mouth every 8 (eight) hours as needed for muscle spasms (muscle pain). 03/02/23   Cassandria Anger, PA-C    Allergies:   Allergies  Allergen Reactions   Bee Venom Anaphylaxis   Feldene [Piroxicam]    Flexeril [Cyclobenzaprine] Nausea Only   Cephalosporins Rash   Keflex [Cephalexin] Rash    Social History:  reports that she has quit smoking. She has never used smokeless tobacco. She reports current alcohol use of about 6.0 standard drinks of alcohol per week. She reports that she does not use drugs.  Family History: Family History  Problem Relation Age of Onset   Cancer - Prostate Father     Physical Exam: Vitals:   03/16/23 2227 03/16/23 2300  BP: 124/71   Pulse:  72   Resp: 20   Temp: 98.1 F (36.7 C)   TempSrc: Oral   SpO2: 94%   Weight:  113.4 kg    General: A& O x 3, obese patient, in pain Eyes: Pink conjunctiva, no scleral icterus ENT: Moist oral mucosa, neck supple, no thyromegaly Lungs: CTA B/L, no wheeze, no crackles, no use of accessory muscles Cardiovascular: RRR, no regurgitation.  No carotid bruits, no JVD Abdomen: soft, positive BS, NTND not an acute abdomen GU: not examined Neuro: CN II - XII grossly intact, sensation intact Musculoskeletal: strength 5/5 all extremities except LLE.  Not lifted secondary to pain.  LLE swelling up to thigh.  Erythematous.  Right elbow seroma, mild TTP Skin: no rash, no subcutaneous crepitation, no decubitus Psych: Flustered but appropriate patient   LLE swollen erythema, TTP leg, hip R elbow fluid filled knot. Mild TTP  Labs on Admission:  Ordered and reviewed  Radiological Exams on Admission: No results found.  Assessment/Plan Present on Admission:  Left hip pain //  Fluid collection on CT //  Cellulitis -sp THR left hip. -DDx postop seroma, hematoma, abscess.  CT imaging from [facility] not available.  Repeat CT ordered for a.m. -Blood cultures x 2 ordered.  IV Rocephin and vancomycin  [pharmacy dosing] -Pain meds as needed -PureWick ordered. -LLE Doppler done OSH negative DVT -Consult physician on-call for [facility] in a.m.   Right elbow seroma -Does not appear infected.  X-ray ordered.  To be discussed with Ortho, inpt vs outpt evaluation.   Hypothyroidism -Synthroid resumed   Anxiety and depression -Patient q. hours sleep Xanax resumed. -Fluoxetine resumed   Morbid obesity (HCC) -On Ozempic   OSA -CPAP ordered, patient has own   Alcohol use -Patient states she drinks half bottle nightly. -CIWA initiated.  No clear evidence of withdrawal.  Last drink on Sunday.  Kendra [name] 03/17/2023, 12:46 AM

## 2023-03-17 NOTE — Plan of Care (Signed)
  Problem: Education: Goal: Knowledge of General Education information will improve Description: Including pain rating scale, medication(s)/side effects and non-pharmacologic comfort measures Outcome: Progressing   Problem: Clinical Measurements: Goal: Ability to maintain clinical measurements within normal limits will improve Outcome: Progressing   Problem: Pain Managment: Goal: General experience of comfort will improve Outcome: Progressing   Problem: Safety: Goal: Ability to remain free from injury will improve Outcome: Progressing   Problem: Pain Management: Goal: Pain level will decrease with appropriate interventions Outcome: Progressing

## 2023-03-17 NOTE — Progress Notes (Signed)
BUSRA DELAUGHTER  MRN: [id number] DOB/Age: 09/11/1945 77 y.o. [facility] Procedure:      Subjective: Patient transferred from [facility] with left LE cellulitis and severe left hip pain s/p L THA performed 8/6. States last Friday had sudden onset of severe left hip pain and now with ongoing excruciating pain.  Received eval at [facility] where reportedly xrays and CT were negative only of fluid collection, but we are unable to see  Vital Signs Temp:  [97.9 F (36.6 C)-99.1 F (37.3 C)] 99.1 F (37.3 C) (08/22 0949) Pulse Rate:  [71-78] 78 (08/22 0949) Resp:  [14-20] 16 (08/22 0949) BP: (114-126)/(53-72) 126/62 (08/22 0949) SpO2:  [92 %-95 %] 92 % (08/22 0949) FiO2 (%):  [21 %] 21 % (08/22 0125) Weight:  [113.4 kg] 113.4 kg (08/21 2300)  Lab Results Recent Labs    03/17/23 0106 03/17/23 0359  WBC 9.7 9.3  HGB 9.7* 8.8*  HCT 30.8* 28.0*  PLT 266 269   BMET Recent Labs    03/17/23 0106 03/17/23 0359  NA 134* 132*  K 3.5 3.5  CL 99 98  CO2 26 26  GLUCOSE 117* 109*  BUN 9 8  CREATININE 0.44 0.42*  CALCIUM 8.6* 8.2*   INR  Date Value Ref Range Status  03/17/2023 1.3 (H) 0.8 - 1.2 Final    Comment:    (NOTE) INR goal varies based on device and disease states. Performed at [facility], [address], [place], [place] [address]      Exam Left anterior hip incision without erythema or active drainage, no surrounding cellulitis  Left lower extremity below knee with obvious cellulitis and previous ink demarcation to right at the knee, there has been some interval resolution and is now distal to knee  Obvious severe pain on attempt at hip motion        Plan I do not see anything on exam to require emergent I&D so will allow her to eat I ordered plain films and will inform Dr. Charlann Boxer of admission More definitive orthopaedic eval and treatment to follow  Ralene Bathe PA-C  03/17/2023, 12:30 PM Contact #  [phone]

## 2023-03-17 NOTE — TOC Initial Note (Addendum)
Transition of Care [facility]) - Initial/Assessment Note   Patient Details  Name: Kendra [name] MRN: [id number] Date of Birth: October 29, 1945  [facility] CM/SW Contact:    Ewing Schlein, LCSW Phone Number: 03/17/2023, 9:54 AM  Clinical Narrative: [facility] consulted for ETOH use resources. CSW met with patient to discuss consult. Patient reported she does not need ETOH use resources and stated the consult was "insulting" and expressed surprise that [facility] was consulted as this was not discussed with her before the consult was placed. TOC clearing consult at this time, but will follow for possible discharge needs.                 Expected Discharge Plan: Home/Self Care Barriers to Discharge: Continued Medical Work up  Patient Goals and CMS Choice Patient states their goals for this hospitalization and ongoing recovery are:: Get better  Expected Discharge Plan and Services Living arrangements for the past 2 months: Single Family Home            DME Arranged: N/A DME Agency: NA  Prior Living Arrangements/Services Living arrangements for the past 2 months: Single Family Home Patient language and need for interpreter reviewed:: Yes Do you feel safe going back to the place where you live?: Yes      Need for Family Participation in Patient Care: No (Comment) Care giver support system in place?: Yes (comment) Criminal Activity/Legal Involvement Pertinent to Current Situation/Hospitalization: No - Comment as needed  Activities of Daily Living Home Assistive Devices/Equipment: Walker (specify type) ADL Screening (condition at time of admission) Patient's cognitive ability adequate to safely complete daily activities?: Yes Is the patient deaf or have difficulty hearing?: No Does the patient have difficulty seeing, even when wearing glasses/contacts?: No Does the patient have difficulty concentrating, remembering, or making decisions?: No Patient able to express need for assistance with ADLs?:  Yes Does the patient have difficulty dressing or bathing?: No Independently performs ADLs?: No Communication: Independent Dressing (OT): Independent Grooming: Independent Feeding: Independent Bathing: Independent with device (comment) Is this a change from baseline?: Pre-admission baseline Toileting: Independent with device (comment) In/Out Bed: Independent with device (comment) Walks in Home: Independent with device (comment) Is this a change from baseline?: Pre-admission baseline Does the patient have difficulty walking or climbing stairs?: Yes Weakness of Legs: Left Weakness of Arms/Hands: None  Emotional Assessment Appearance:: Appears stated age Attitude/Demeanor/Rapport: Engaged Affect (typically observed): Defensive Orientation: : Oriented to Self, Oriented to Place, Oriented to  Time, Oriented to Situation Alcohol / Substance Use: Alcohol Use Psych Involvement: No (comment)  Admission diagnosis:  Left hip pain [M25.552] Patient Active Problem List   Diagnosis Date Noted   Left hip pain 03/17/2023   S/P total left hip arthroplasty 03/01/2023   Dyspnea on exertion 08/03/2021   History of COVID-19 08/03/2021   Anemia 07/16/2021   Depression 07/16/2021   Degenerative disc disease, lumbar 07/16/2021   Hypertension 07/16/2021   Mixed hyperlipidemia 07/16/2021   Morbid obesity (HCC) 07/16/2021   Peripheral edema 07/16/2021   Pre-diabetes 07/16/2021   Restless leg syndrome 07/16/2021   Sleep apnea 07/16/2021   Spinal headache 07/16/2021   Spinal stenosis at L4-L5 level 07/16/2021   Valvular heart disease 07/15/2021   Iron deficiency 12/30/2020   Iron deficiency anemia 12/29/2020   Chronic pain 12/11/2020   Lumbar spondylosis 05/21/2020   Atrophic vaginitis 12/26/2019   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 [place] [address] Phone: [phone] Fax: [phone]  Social Determinants of Health (SDOH) Social History: SDOH Screenings   Food Insecurity: No Food Insecurity (03/17/2023)  Housing: Low Risk  (03/17/2023)  Transportation Needs: No Transportation Needs (03/17/2023)  Utilities: Not At Risk (03/17/2023)  Financial Resource Strain: Low Risk  (12/22/2020)   Received from [facility] visits prior to 09/25/2022., [facility] visits prior to 09/25/2022.  Physical Activity: Unknown (12/22/2020)   Received from [facility] visits prior to 09/25/2022., [facility] visits prior to 09/25/2022.  Social Connections: Moderately Integrated (12/22/2020)   Received from [facility] visits prior to 09/25/2022., [facility] visits prior to 09/25/2022.  Stress: Stress Concern Present (12/22/2020)   Received from [facility] visits prior to 09/25/2022., [facility] visits prior to 09/25/2022.  Tobacco Use: Medium Risk (03/01/2023)   SDOH Interventions:    Readmission Risk Interventions     No data to display

## 2023-03-18 DIAGNOSIS — M25552 Pain in left hip: Secondary | ICD-10-CM | POA: Diagnosis not present

## 2023-03-18 LAB — PHOSPHORUS: Phosphorus: 4.4 mg/dL (ref 2.5–4.6)

## 2023-03-18 LAB — CBC
HCT: 30 % — ABNORMAL LOW (ref 36.0–46.0)
Hemoglobin: 9.5 g/dL — ABNORMAL LOW (ref 12.0–15.0)
MCH: 30.6 pg (ref 26.0–34.0)
MCHC: 31.7 g/dL (ref 30.0–36.0)
MCV: 96.8 fL (ref 80.0–100.0)
Platelets: 277 10*3/uL (ref 150–400)
RBC: 3.1 MIL/uL — ABNORMAL LOW (ref 3.87–5.11)
RDW: 15.1 % (ref 11.5–15.5)
WBC: 10.3 10*3/uL (ref 4.0–10.5)
nRBC: 0 % (ref 0.0–0.2)

## 2023-03-18 LAB — BASIC METABOLIC PANEL
Anion gap: 7 (ref 5–15)
BUN: 10 mg/dL (ref 8–23)
CO2: 27 mmol/L (ref 22–32)
Calcium: 8.5 mg/dL — ABNORMAL LOW (ref 8.9–10.3)
Chloride: 100 mmol/L (ref 98–111)
Creatinine, Ser: 0.59 mg/dL (ref 0.44–1.00)
GFR, Estimated: >60 mL/min (ref >60)
Glucose, Bld: 142 mg/dL — ABNORMAL HIGH (ref 70–99)
Potassium: 3.6 mmol/L (ref 3.5–5.1)
Sodium: 134 mmol/L — ABNORMAL LOW (ref 135–145)

## 2023-03-18 LAB — GLUCOSE, CAPILLARY: Glucose-Capillary: 129 mg/dL — ABNORMAL HIGH (ref 70–99)

## 2023-03-18 LAB — MAGNESIUM: Magnesium: 2.1 mg/dL (ref 1.7–2.4)

## 2023-03-18 MED ORDER — LATANOPROST 0.005 % OP SOLN
1.0000 [drp] | Freq: Every day | OPHTHALMIC | Status: DC
  Administered 2023-03-18 – 2023-03-20 (×3): 1 [drp] via OPHTHALMIC
  Filled 2023-03-18: qty 2.5

## 2023-03-18 MED ORDER — SUMATRIPTAN SUCCINATE 50 MG PO TABS
100.0000 mg | ORAL_TABLET | Freq: Two times a day (BID) | ORAL | Status: DC | PRN
  Administered 2023-03-18: 100 mg via ORAL
  Filled 2023-03-18 (×2): qty 2

## 2023-03-18 NOTE — Evaluation (Signed)
Physical Therapy Evaluation Patient Details Name: Kendra [name] MRN: [id number] DOB: 03-31-1946 Today's Date: 03/18/2023  History of Present Illness  77 year old female transferred from [facility]  Her past medical history includes hypothyroidism, depression, sleep apnea, morbid obesity, spinal stenosis, GERD.  On 03/01/2023 patient underwent left hip total replacement.  Per patient her leg has been swollen since but otherwise she did well until 03/17/23 when she tried to get up from a table, felt a sharp pain in her left hip that she had never had before. CT showed L hip fluid collection  seroma vs hematoma. Antibiotics for cellulitis started at [facility]. Imaging at [facility] showed possible shift in position of acetabular cup component.  Clinical Impression  Pt admitted with above diagnosis. Pt ambulated 73' with RW, no loss of balance, distance limited by L hip pain. Pt reports she's tolerating more activity today than she could yesterday. Pitting edema noted B feet, RN aware. Pt reports she lives alone and that her sister from [place] has been assisting her for the past couple weeks, but needs to go home. Pt is interested in pursuing ST-SNF.  Pt currently with functional limitations due to the deficits listed below (see PT Problem List). Pt will benefit from acute skilled PT to increase their independence and safety with mobility to allow discharge.           If plan is discharge home, recommend the following: A little help with walking and/or transfers;A little help with bathing/dressing/bathroom;Assistance with cooking/housework;Assist for transportation;Help with stairs or ramp for entrance   Can travel by private vehicle   Yes    Equipment Recommendations None recommended by PT  Recommendations for Other Services       Functional Status Assessment Patient has had a recent decline in their functional status and demonstrates the ability to make significant improvements in  function in a reasonable and predictable amount of time.     Precautions / Restrictions Precautions Precautions: Fall Restrictions Weight Bearing Restrictions: No LLE Weight Bearing: Weight bearing as tolerated      Mobility  Bed Mobility               General bed mobility comments: up in recliner    Transfers Overall transfer level: Needs assistance Equipment used: Rolling walker (2 wheels) Transfers: Sit to/from Stand Sit to Stand: From elevated surface, Supervision           General transfer comment: VCs hand placement, no loss of balance    Ambulation/Gait Ambulation/Gait assistance: Supervision Gait Distance (Feet): 70 Feet Assistive device: Rolling walker (2 wheels) Gait Pattern/deviations: Step-to pattern, Decreased step length - left, Decreased step length - right Gait velocity: decr     General Gait Details: steady, no loss of balance, 7/10 L hip pain  Stairs            Wheelchair Mobility     Tilt Bed    Modified Rankin (Stroke Patients Only)       Balance Overall balance assessment: Modified Independent                                           Pertinent Vitals/Pain Pain Assessment Pain Score: 7  Pain Location: L hip with movement Pain Descriptors / Indicators: Sore, Pressure Pain Intervention(s): Limited activity within patient's tolerance, Monitored during session, Patient requesting pain meds-RN notified    Home Living  Family/patient expects to be discharged to:: Private residence Living Arrangements: Alone Available Help at Discharge: Family;Available PRN/intermittently   Home Access: Level entry       Home Layout: One level Home Equipment: Agricultural consultant (2 wheels);BSC/3in1;Wheelchair - manual Additional Comments: sister from [place] has been helping pt for past 2 weeks, unclear if she can stay much longer, pt interested in ST-SNF    Prior Function Prior Level of Function : Independent/Modified  Independent             Mobility Comments: walked with RW, used WC for longer distances ADLs Comments: independent     Extremity/Trunk Assessment   Upper Extremity Assessment Upper Extremity Assessment: Defer to OT evaluation RUE Deficits / Details: recent ulnar release surgery ~5 weeks ago, reports some tingling/numbness 4th and 5th fingers,  pt is R handed, elbow has some swelling, it is wrapped in ace wrap RUE Sensation: decreased light touch    Lower Extremity Assessment LLE Deficits / Details: knee ext 4/5, hip flexion AAROM ~30* limited by pain, hip +2/5 grossly; pitting edema noted B feet (RN aware) LLE Sensation: decreased light touch LLE Coordination: WNL    Cervical / Trunk Assessment Cervical / Trunk Assessment: Normal  Communication   Communication Communication: No apparent difficulties  Cognition Arousal: Alert Behavior During Therapy: WFL for tasks assessed/performed Overall Cognitive Status: Within Functional Limits for tasks assessed                                          General Comments      Exercises Total Joint Exercises Ankle Circles/Pumps: AROM, Both, 10 reps, Supine Heel Slides: AAROM, Left, 5 reps, Supine   Assessment/Plan    PT Assessment Patient needs continued PT services  PT Problem List Decreased activity tolerance;Decreased range of motion;Decreased strength;Decreased balance;Decreased mobility;Pain       PT Treatment Interventions DME instruction;Gait training;Therapeutic activities;Therapeutic exercise;Patient/family education    PT Goals (Current goals can be found in the Care Plan section)  Acute Rehab PT Goals Patient Stated Goal: to walk PT Goal Formulation: With patient Time For Goal Achievement: 04/01/23 Potential to Achieve Goals: Good    Frequency Min 5X/week     Co-evaluation               AM-PAC PT "6 Clicks" Mobility  Outcome Measure Help needed turning from your back to your side  while in a flat bed without using bedrails?: A Little Help needed moving from lying on your back to sitting on the side of a flat bed without using bedrails?: A Little Help needed moving to and from a bed to a chair (including a wheelchair)?: A Little Help needed standing up from a chair using your arms (e.g., wheelchair or bedside chair)?: A Little Help needed to walk in hospital room?: A Little Help needed climbing 3-5 steps with a railing? : A Lot 6 Click Score: 17    End of Session Equipment Utilized During Treatment: Gait belt Activity Tolerance: Patient limited by pain;Patient tolerated treatment well Patient left: in chair;with call bell/phone within reach;with chair alarm set Nurse Communication: Mobility status PT Visit Diagnosis: Difficulty in walking, not elsewhere classified (R26.2);Pain;Other abnormalities of gait and mobility (R26.89) Pain - Right/Left: Left Pain - part of body: Hip    Time: 4782-9562 PT Time Calculation (min) (ACUTE ONLY): 23 min   Charges:   PT Evaluation $PT  Eval Moderate Complexity: 1 Mod PT Treatments $Gait Training: 8-22 mins PT General Charges $$ ACUTE PT VISIT: 1 Visit         Tamala Ser PT 03/18/2023  Acute Rehabilitation Services  Office [phone]

## 2023-03-18 NOTE — [place] FL2 (Signed)
[place] MEDICAID FL2 LEVEL OF CARE FORM     IDENTIFICATION  Patient Name: Kendra [name] Birthdate: 15-Oct-1945 Sex: female Admission Date (Current Location): 03/16/2023  [facility] and [place] Number:  Producer, television/film/video and Address:  [facility],  [address] [place], [place] [address]      Provider Number: [id number]  Attending Physician Name and Address:  Barnetta Chapel, MD  Relative Name and Phone Number:  Lennox Pippins (sister) Ph: [phone]    Current Level of Care: Hospital Recommended Level of Care: Skilled Nursing Facility Prior Approval Number:    Date Approved/Denied:   PASRR Number: [id number]  Discharge Plan: SNF    Current Diagnoses: Patient Active Problem List   Diagnosis Date Noted   Left hip pain 03/17/2023   S/P total left hip arthroplasty 03/01/2023   Dyspnea on exertion 08/03/2021   History of COVID-19 08/03/2021   Anemia 07/16/2021   Depression 07/16/2021   Degenerative disc disease, lumbar 07/16/2021   Hypertension 07/16/2021   Mixed hyperlipidemia 07/16/2021   Morbid obesity (HCC) 07/16/2021   Peripheral edema 07/16/2021   Pre-diabetes 07/16/2021   Restless leg syndrome 07/16/2021   Sleep apnea 07/16/2021   Spinal headache 07/16/2021   Spinal stenosis at L4-L5 level 07/16/2021   Valvular heart disease 07/15/2021   Iron deficiency 12/30/2020   Iron deficiency anemia 12/29/2020   Chronic pain 12/11/2020   Lumbar spondylosis 05/21/2020   Atrophic vaginitis 12/26/2019   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   Popliteal pain 07/23/2019   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   Malaise and fatigue 05/25/2018   Morbid obesity with BMI of 40.0-44.9, adult (HCC) 11/17/2016   GERD without esophagitis 10/08/2015   Primary hypothyroidism 10/08/2015   Benign essential hypertension 10/08/2015   High risk medication use 10/08/2015   Chest pain in adult 07/02/2015   Insomnia 11/27/2013   Spondylolisthesis, grade 2 11/27/2013     Orientation RESPIRATION BLADDER Height & Weight     Self, Time, Situation, Place  Normal Continent Weight: 250 lb (113.4 kg) Height:  5\' 8"  (172.7 cm)  BEHAVIORAL SYMPTOMS/MOOD NEUROLOGICAL BOWEL NUTRITION STATUS   (N/A)  (N/A) Continent Diet (Regular diet)  AMBULATORY STATUS COMMUNICATION OF NEEDS Skin   Limited Assist Verbally Other (Comment) (Erythema: left leg; Eccyhmosis: bilateral arm)                       Personal Care Assistance Level of Assistance  Bathing, Feeding, Dressing Bathing Assistance: Limited assistance Feeding assistance: Independent Dressing Assistance: Limited assistance     Functional Limitations Info  Sight, Hearing, Speech Sight Info: Impaired Hearing Info: Adequate Speech Info: Adequate    SPECIAL CARE FACTORS FREQUENCY  PT (By licensed PT), OT (By licensed OT)     PT Frequency: 5x's/week OT Frequency: 5x's/week            Contractures Contractures Info: Not present    Additional Factors Info  Code Status, Allergies, Psychotropic Code Status Info: Full Allergies Info: Bee Venom, Feldene (Piroxicam), Flexeril (Cyclobenzaprine), Cephalosporins, Keflex (Cephalexin) Psychotropic Info: Prozac, Xanax         Current Medications (03/18/2023):  This is the current hospital active medication list Current Facility-Administered Medications  Medication Dose Route Frequency Provider Last Rate Last Admin   0.9 % NaCl with KCl 20 mEq/ L  infusion   Intravenous Continuous Gery Pray, MD 75 mL/hr at 03/17/23 0108 New Bag at 03/17/23 0108   acetaminophen (TYLENOL) tablet 650 mg  650 mg Oral  Q6H PRN Gery Pray, MD       Or   acetaminophen (TYLENOL) suppository 650 mg  650 mg Rectal Q6H PRN Crosley, Debby, MD       acetaminophen (TYLENOL) tablet 650 mg  650 mg Oral Q6H PRN Crosley, Debby, MD       ALPRAZolam Prudy Feeler) tablet 0.5 mg  0.5 mg Oral QHS PRN Crosley, Debby, MD   0.5 mg at 03/17/23 0149   ascorbic acid (VITAMIN C) tablet 500 mg   500 mg Oral Daily Gillis Santa, MD   500 mg at 03/18/23 1023   bisacodyl (DULCOLAX) EC tablet 10 mg  10 mg Oral QHS Gillis Santa, MD   10 mg at 03/17/23 2226   bisacodyl (DULCOLAX) suppository 10 mg  10 mg Rectal Daily PRN Gillis Santa, MD       FLUoxetine (PROZAC) capsule 20 mg  20 mg Oral Daily Crosley, Debby, MD   20 mg at 03/18/23 1023   folic acid (FOLVITE) tablet 1 mg  1 mg Oral Daily Crosley, Debby, MD   1 mg at 03/18/23 1023   furosemide (LASIX) injection 20 mg  20 mg Intravenous Daily Durene Romans, MD   20 mg at 03/18/23 1031   HYDROmorphone (DILAUDID) injection 0.5 mg  0.5 mg Intravenous Q3H PRN Crosley, Debby, MD   0.5 mg at 03/17/23 1706   iron polysaccharides (NIFEREX) capsule 150 mg  150 mg Oral Daily Gillis Santa, MD   150 mg at 03/18/23 1033   levothyroxine (SYNTHROID) tablet 88 mcg  88 mcg Oral Q0600 Crosley, Debby, MD   88 mcg at 03/18/23 0556   LORazepam (ATIVAN) tablet 1-4 mg  1-4 mg Oral Q1H PRN Gery Pray, MD       Or   LORazepam (ATIVAN) injection 1-4 mg  1-4 mg Intravenous Q1H PRN Joneen Roach, Debby, MD   2 mg at 03/17/23 1132   multivitamin with minerals tablet 1 tablet  1 tablet Oral Daily Crosley, Debby, MD   1 tablet at 03/18/23 1023   oxyCODONE (Oxy IR/ROXICODONE) immediate release tablet 5-10 mg  5-10 mg Oral Q4H PRN Gillis Santa, MD   10 mg at 03/18/23 1022   pantoprazole (PROTONIX) EC tablet 40 mg  40 mg Oral Daily Gillis Santa, MD   40 mg at 03/18/23 1023   piperacillin-tazobactam (ZOSYN) IVPB 3.375 g  3.375 g Intravenous Q8H Kumar, Dileep, MD 12.5 mL/hr at 03/18/23 1026 3.375 g at 03/18/23 1026   polyethylene glycol (MIRALAX / GLYCOLAX) packet 17 g  17 g Oral BID Gillis Santa, MD       pramipexole (MIRAPEX) tablet 2 mg  2 mg Oral TID Joneen Roach, Debby, MD   2 mg at 03/18/23 1023   predniSONE (DELTASONE) tablet 40 mg  40 mg Oral Q breakfast Gillis Santa, MD   40 mg at 03/18/23 2725   senna-docusate (Senokot-S) tablet 1 tablet  1 tablet Oral QHS PRN Crosley,  Debby, MD       thiamine (VITAMIN B1) tablet 100 mg  100 mg Oral Daily Crosley, Debby, MD   100 mg at 03/17/23 0146   Or   thiamine (VITAMIN B1) injection 100 mg  100 mg Intravenous Daily Crosley, Debby, MD   100 mg at 03/18/23 1022   vancomycin (VANCOREADY) IVPB 1500 mg/300 mL  1,500 mg Intravenous Q24H Maurice March, RPH 150 mL/hr at 03/17/23 2051 1,500 mg at 03/17/23 2051     Discharge Medications: Please see discharge summary for a list of discharge  medications.  Relevant Imaging Results:  Relevant Lab Results:   Additional Information SSN: [id number]  Ewing Schlein, LCSW

## 2023-03-18 NOTE — Evaluation (Signed)
Occupational Therapy Evaluation Patient Details Name: Kendra [name] MRN: [id number] DOB: August 05, 1945 Today's Date: 03/18/2023   History of Present Illness 77 year old female transferred from [facility].  Her past medical history includes hypothyroidism, depression, sleep apnea, morbid obesity, spinal stenosis, GERD.  On 03/01/2023 patient underwent left hip total replacement.  Per patient her leg has been swollen since but otherwise she did well until 03/17/23 when she tried to get up from a table, felt a sharp pain in her left hip that she had never had before. CT showed L hip fluid collection  seroma vs hematoma. Antibiotics for cellulitis started at [facility]. Imaging at [facility] showed possible shift in position of acetabular cup component.   Clinical Impression   Pt presents with decline in function and safety with ADLs and ADL mobility with impaired strength, balance and endurance. PTA pt lived at home alone and was Ind with ADLs, IADLs, drives. Pt currently requires set up with UB ADLs, CGA standing for grooming/hygiene tasks, max A LB ADLs and min A with toileting tasks (posterior hygiene); CGA using RW for ADL mobility and CGA to transfer to toilet. Pt would benefit from acute OT services to address impairments to maximize level of function and safety      If plan is discharge home, recommend the following: A lot of help with bathing/dressing/bathroom;A little help with walking and/or transfers;Assistance with cooking/housework;Assist for transportation;Help with stairs or ramp for entrance    Functional Status Assessment  Patient has had a recent decline in their functional status and demonstrates the ability to make significant improvements in function in a reasonable and predictable amount of time.  Equipment Recommendations  Other (comment) (defer)    Recommendations for Other Services       Precautions / Restrictions Precautions Precautions: Fall Restrictions Weight  Bearing Restrictions: No LLE Weight Bearing: Weight bearing as tolerated      Mobility Bed Mobility Overal bed mobility: Needs Assistance Bed Mobility: Sit to Supine       Sit to supine: Mod assist   General bed mobility comments: up in recliner upon arrival, assisted pt back to bed at end of session mod A with LE mgt    Transfers Overall transfer level: Needs assistance Equipment used: Rolling walker (2 wheels) Transfers: Sit to/from Stand Sit to Stand: From elevated surface, Contact guard assist                  Balance Overall balance assessment: Modified Independent                                         ADL either performed or assessed with clinical judgement   ADL Overall ADL's : Needs assistance/impaired Eating/Feeding: Independent   Grooming: Wash/dry hands;Wash/dry face;Contact guard assist;Standing   Upper Body Bathing: Set up;Sitting   Lower Body Bathing: Maximal assistance   Upper Body Dressing : Set up;Sitting   Lower Body Dressing: Maximal assistance   Toilet Transfer: Contact guard assist;Ambulation;Rolling walker (2 wheels);Regular Toilet;BSC/3in1;Grab bars   Toileting- Clothing Manipulation and Hygiene: Moderate assistance;Sit to/from stand       Functional mobility during ADLs: Contact guard assist;Rolling walker (2 wheels);Cueing for safety       Vision Ability to See in Adequate Light: 0 Adequate Patient Visual Report: No change from baseline       Perception         Praxis  Pertinent Vitals/Pain Pain Assessment Pain Assessment: 0-10 Pain Score: 5  Pain Location: L hip with movement Pain Descriptors / Indicators: Sore, Pressure Pain Intervention(s): Monitored during session, Repositioned, Limited activity within patient's tolerance     Extremity/Trunk Assessment Upper Extremity Assessment Upper Extremity Assessment: Generalized weakness RUE Deficits / Details: recent ulnar release surgery  ~5 weeks ago, reports some tingling/numbness 4th and 5th fingers,  pt is R handed, elbow has some swelling, it is wrapped in ace wrap RUE Sensation: decreased light touch   Lower Extremity Assessment Lower Extremity Assessment: Defer to PT evaluation LLE Deficits / Details: knee ext 4/5, hip flexion AAROM ~30* limited by pain, hip +2/5 grossly; pitting edema noted B feet (RN aware) LLE Sensation: decreased light touch LLE Coordination: WNL   Cervical / Trunk Assessment Cervical / Trunk Assessment: Normal   Communication Communication Communication: No apparent difficulties   Cognition Arousal: Alert Behavior During Therapy: WFL for tasks assessed/performed Overall Cognitive Status: Within Functional Limits for tasks assessed                                       General Comments       Exercises     Shoulder Instructions      Home Living Family/patient expects to be discharged to:: Private residence Living Arrangements: Alone Available Help at Discharge: Family;Available PRN/intermittently Type of Home: House Home Access: Level entry     Home Layout: One level     Bathroom Shower/Tub: Walk-in shower;Tub/shower unit   Bathroom Toilet: Standard     Home Equipment: Agricultural consultant (2 wheels);BSC/3in1;Wheelchair - manual   Additional Comments: sister from [place] has been helping pt for past 2 weeks, unclear if she can stay much longer, pt interested in ST-SNF      Prior Functioning/Environment Prior Level of Function : Independent/Modified Independent             Mobility Comments: walked with RW, used WC for longer distances ADLs Comments: Ind with ADLs, IADLs, drives        OT Problem List: Decreased strength;Impaired balance (sitting and/or standing);Pain;Decreased activity tolerance;Decreased knowledge of use of DME or AE      OT Treatment/Interventions: Self-care/ADL training;DME and/or AE instruction;Therapeutic activities;Therapeutic  exercise;Patient/family education    OT Goals(Current goals can be found in the care plan section) Acute Rehab OT Goals Patient Stated Goal: "go to rehab and go home" OT Goal Formulation: With patient/family Time For Goal Achievement: 04/01/23 Potential to Achieve Goals: Good  OT Frequency: Min 2X/week    Co-evaluation              AM-PAC OT "6 Clicks" Daily Activity     Outcome Measure Help from another person eating meals?: None Help from another person taking care of personal grooming?: A Little Help from another person toileting, which includes using toliet, bedpan, or urinal?: A Little Help from another person bathing (including washing, rinsing, drying)?: A Lot Help from another person to put on and taking off regular upper body clothing?: A Little Help from another person to put on and taking off regular lower body clothing?: A Lot 6 Click Score: 17   End of Session Equipment Utilized During Treatment: Gait belt;Rolling walker (2 wheels)  Activity Tolerance: Patient tolerated treatment well Patient left: in bed;with call bell/phone within reach;with family/visitor present  OT Visit Diagnosis: Unsteadiness on feet (R26.81);Other abnormalities of gait and mobility (R26.89);Muscle  weakness (generalized) (M62.81);Pain Pain - Right/Left: Left Pain - part of body: Hip                Time: 1610-9604 OT Time Calculation (min): 25 min Charges:  OT General Charges $OT Visit: 1 Visit OT Evaluation $OT Eval Moderate Complexity: 1 Mod OT Treatments $Therapeutic Activity: 8-22 mins    Galen Manila 03/18/2023, 1:58 PM

## 2023-03-18 NOTE — TOC Progression Note (Signed)
Transition of Care [facility]) - Progression Note   Patient Details  Name: Kendra [name] MRN: [id number] Date of Birth: 06/27/1946  [facility] CM/SW Contact  Ewing Schlein, LCSW Phone Number: 03/18/2023, 12:59 PM  Clinical Narrative: CSW followed up with patient and confirmed she is planning on being discharged to [facility] for SNF. CSW followed up with [facility] in admissions at [facility]. FL2 done. Initial referral faxed to facility in hub.  Expected Discharge Plan: Skilled Nursing Facility Barriers to Discharge: Continued Medical Work up  Expected Discharge Plan and Services Living arrangements for the past 2 months: Single Family Home           DME Arranged: N/A DME Agency: NA  Social Determinants of Health (SDOH) Interventions SDOH Screenings   Food Insecurity: No Food Insecurity (03/17/2023)  Housing: Low Risk  (03/17/2023)  Transportation Needs: No Transportation Needs (03/17/2023)  Utilities: Not At Risk (03/17/2023)  Financial Resource Strain: Low Risk  (12/22/2020)   Received from [facility] visits prior to 09/25/2022., [facility] visits prior to 09/25/2022.  Physical Activity: Unknown (12/22/2020)   Received from [facility] visits prior to 09/25/2022., [facility] visits prior to 09/25/2022.  Social Connections: Moderately Integrated (12/22/2020)   Received from [facility] visits prior to 09/25/2022., [facility] visits prior to 09/25/2022.  Stress: Stress Concern Present (12/22/2020)   Received from [facility] visits prior to 09/25/2022., [facility] visits prior to 09/25/2022.  Tobacco Use: Medium Risk (03/17/2023)   Readmission Risk Interventions     No data to display

## 2023-03-18 NOTE — Progress Notes (Signed)
Patient ID: Kendra [name], female   DOB: Jun 21, 1946, 77 y.o.   MRN: [id number]  POD# 17 Feels a little better this am with combination of Toradol and prednisone (probably doesn't need both at this point)  Still has complaints of multiple joint pains, including lower legs and right elbow  Left greater than right LE edema with erythema improved from yesterdays evaluation  AP pelvis ordered yesterday I have some concerns about the position of her acetabular component though screw stable the inferior portion of the cup seems to perhaps have shifted.  17 days s/p left THR with post op pain versus component shifting No concerns for infection If she is unable to perform therapy due to pain the findings radiographically may need to be interpreted more acutely We will follow and plan accordingly

## 2023-03-18 NOTE — Progress Notes (Signed)
   03/18/23 2215  BiPAP/CPAP/SIPAP  BiPAP/CPAP/SIPAP Pt Type Adult  Mask Type Nasal pillows  FiO2 (%) 21 %  Patient Home Equipment Yes

## 2023-03-18 NOTE — Progress Notes (Signed)
PROGRESS NOTE    Kendra [name]  BJY:[id number] DOB: 10/27/1945 DOA: 03/16/2023 PCP: Krystal Clark, NP  Outpatient Specialists:     Brief Narrative:  Patient is a 77 year old female transferred from [facility].  Past medical history significant for hypothyroidism, depression, sleep apnea, morbid obesity, GERD.  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  Patient initially did well.  Patient presented with a sharp pain in her left hip that she had never had before.  The pain has persisted, with no improvement.  Ortho was contacted, the thought was this was normal postop pain and swelling.  Patient also has cellulitis.  Vascular Doppler ultrasound of lower extremities negative for DVT.At [facility] Doppler study done was negative for DVT.    03/18/2023: Patient seen.  Left lower extremity cellulitis is improving.  Will continue IV antibiotics for now.  Orthopedic input is appreciated.  Patient also reports migraine headache.  Patient takes Imitrex at home.  Will start patient on as needed Imitrex.  Orthopedic team is directing management of left hip collection and pain.   Assessment & Plan:   Principal Problem:   Left hip pain Active Problems:   Depression   Morbid obesity (HCC)   Sleep apnea   Benign essential hypertension   High risk medication use    Left hip pain //  Fluid collection on CT //  Cellulitis -sp THR left hip. -DDx postop seroma, hematoma, abscess.  CT imaging from [facility] not available.  Repeat CT ordered for a.m. -Blood cultures x 2 ordered.  IV Rocephin and vancomycin [pharmacy dosing] -Pain meds as needed -PureWick ordered. -LLE Doppler done OSH negative DVT -Consult physician on-call for [facility] in a.m. 03/18/2023: Orthopedic input is appreciated.  Orthopedic is directing care.  Cellulitis has improved.  Continue antibiotics.    Right elbow seroma -Does not appear infected.  X-ray ordered.  To be discussed with Ortho,  inpt vs outpt evaluation.    Hypothyroidism -Synthroid resumed    Anxiety and depression -Patient q. hours sleep Xanax resumed. -Fluoxetine resumed    Morbid obesity (HCC) -On Ozempic    OSA -CPAP ordered, patient has own    Alcohol use -Patient states she drinks half bottle nightly. -CIWA initiated.  No clear evidence of withdrawal.  Last drink on Sunday.   DVT prophylaxis: SCD. Code Status: Full code. Family Communication:  Disposition Plan: This will depend on hospital course   Consultants:  Orthopedic surgery  Procedures:  None  Antimicrobials:  IV vancomycin. IV Zosyn.   Subjective: Left lower extremity cellulitis and swollen Left hip pain Migraine like headache  Objective: Vitals:   03/17/23 1936 03/17/23 2130 03/18/23 0550 03/18/23 1411  BP:  124/65 134/72 113/64  Pulse: 85 80 72 67  Resp: 16 14 15 17   Temp:  100 F (37.8 C) 98.3 F (36.8 C) 98.1 F (36.7 C)  TempSrc:  Oral Oral Oral  SpO2: 92% 90% 94% 93%  Weight:      Height:        Intake/Output Summary (Last 24 hours) at 03/18/2023 1618 Last data filed at 03/18/2023 0600 Gross per 24 hour  Intake 876.11 ml  Output 1250 ml  Net -373.89 ml   Filed Weights   03/16/23 2300  Weight: 113.4 kg    Examination:  General exam: Appears calm and comfortable.  Patient is obese. Respiratory system: Clear to auscultation. Cardiovascular system: S1 & S2 heard. Gastrointestinal system: Abdomen is obese, soft and nontender.   Central nervous  system: Awake and alert.  Patient moves all extremities.   Extremities: Left lower extremity cellulitis and swelling improving.   Data Reviewed: I have personally reviewed following labs and imaging studies  CBC: Recent Labs  Lab 03/17/23 0106 03/17/23 0359 03/18/23 0336  WBC 9.7 9.3 10.3  NEUTROABS 7.1 6.8  --   HGB 9.7* 8.8* 9.5*  HCT 30.8* 28.0* 30.0*  MCV 97.5 97.2 96.8  PLT 266 269 277   Basic Metabolic Panel: Recent Labs  Lab  03/17/23 0106 03/17/23 0359 03/18/23 0336  NA 134* 132* 134*  K 3.5 3.5 3.6  CL 99 98 100  CO2 26 26 27   GLUCOSE 117* 109* 142*  BUN 9 8 10   CREATININE 0.44 0.42* 0.59  CALCIUM 8.6* 8.2* 8.5*  MG 2.1  --  2.1  PHOS  --   --  4.4   GFR: Estimated Creatinine Clearance: 80.3 mL/min (by C-G formula based on SCr of 0.59 mg/dL). Liver Function Tests: Recent Labs  Lab 03/17/23 0106  AST 12*  ALT 11  ALKPHOS 80  BILITOT 1.2  PROT 6.0*  ALBUMIN 3.2*   No results for input(s): "LIPASE", "AMYLASE" in the last 168 hours. No results for input(s): "AMMONIA" in the last 168 hours. Coagulation Profile: Recent Labs  Lab 03/17/23 0359  INR 1.3*   Cardiac Enzymes: No results for input(s): "CKTOTAL", "CKMB", "CKMBINDEX", "TROPONINI" in the last 168 hours. BNP (last 3 results) No results for input(s): "PROBNP" in the last 8760 hours. HbA1C: Recent Labs    03/17/23 0410  HGBA1C 4.9   CBG: Recent Labs  Lab 03/17/23 1232 03/17/23 1632 03/17/23 2132 03/18/23 0721  GLUCAP 95 110* 185* 129*   Lipid Profile: No results for input(s): "CHOL", "HDL", "LDLCALC", "TRIG", "CHOLHDL", "LDLDIRECT" in the last 72 hours. Thyroid Function Tests: No results for input(s): "TSH", "T4TOTAL", "FREET4", "T3FREE", "THYROIDAB" in the last 72 hours. Anemia Panel: Recent Labs    03/17/23 1025  VITAMINB12 506  FOLATE 9.0  TIBC 276  IRON 19*   Urine analysis:    Component Value Date/Time   COLORURINE STRAW (A) 12/08/2020 1108   APPEARANCEUR CLEAR 12/08/2020 1108   LABSPEC 1.006 12/08/2020 1108   PHURINE 7.0 12/08/2020 1108   GLUCOSEU NEGATIVE 12/08/2020 1108   HGBUR NEGATIVE 12/08/2020 1108   BILIRUBINUR NEGATIVE 12/08/2020 1108   KETONESUR NEGATIVE 12/08/2020 1108   PROTEINUR NEGATIVE 12/08/2020 1108   NITRITE NEGATIVE 12/08/2020 1108   LEUKOCYTESUR NEGATIVE 12/08/2020 1108   Sepsis Labs: @LABRCNTIP (procalcitonin:4,lacticidven:4)  ) Recent Results (from the past 240 hour(s))   Culture, blood (Routine X 2) w Reflex to ID Panel     Status: None (Preliminary result)   Collection Time: 03/17/23  1:06 AM   Specimen: BLOOD  Result Value Ref Range Status   Specimen Description   Final    BLOOD BLOOD RIGHT ARM Performed at [facility], [address], [place], [place] [address]    Special Requests   Final    BOTTLES DRAWN AEROBIC AND ANAEROBIC Blood Culture adequate volume Performed at [facility], [address] [place], [place] [address]    Culture   Final    NO GROWTH 1 DAY Performed at [facility], [address], [place], [place] [address]    Report Status PENDING  Incomplete  Culture, blood (Routine X 2) w Reflex to ID Panel     Status: None (Preliminary result)   Collection Time: 03/17/23  1:07 AM   Specimen:  BLOOD  Result Value Ref Range Status   Specimen Description   Final    BLOOD BLOOD RIGHT HAND Performed at [facility], [address] [place], [place] [address]    Special Requests   Final    BOTTLES DRAWN AEROBIC AND ANAEROBIC Blood Culture adequate volume Performed at [facility], [address] [place], [place] [address]    Culture   Final    NO GROWTH 1 DAY Performed at [facility], [address] [place], [place] [address]    Report Status PENDING  Incomplete         Radiology Studies: DG HIP UNILAT WITH PELVIS 1V LEFT  Result Date: 03/17/2023 CLINICAL DATA:  Left hip pain.  Postoperative 03/01/2023. EXAM: DG HIP (WITH OR WITHOUT PELVIS) 1V*L* COMPARISON:  Pelvis and left hip radiographs 03/14/2023, left hip intraoperative fluoroscopy 03/01/2023; CT left hip 03/16/2023 FINDINGS: Postsurgical changes are again seen of total left hip arthroplasty. The hardware positioning appears not significantly changed from 03/14/2023. The acetabular cup may be more vertically inclined compared to the intraoperative placement on intraoperative  fluoroscopic images 03/01/2023. There is associated mild uncovering of the superolateral aspect of left femoral head. Subtle lucency within the superior left acetabulum bordering the superomedial left acetabular cup, unchanged from intraoperative radiographs and again favored to be present prior to the recent left acetabular club placement. Moderate pubic symphysis joint space narrowing and subchondral sclerosis. No acute fracture or dislocation. 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 The acetabular cup may be more vertically inclined compared to the intraoperative placement on intraoperative fluoroscopic images 03/01/2023. The appearance is unchanged from 03/14/2023 frontal radiograph. Electronically Signed   By: Neita Garnet M.D.   On: 03/17/2023 16:45        Scheduled Meds:  ascorbic acid  500 mg Oral Daily   bisacodyl  10 mg Oral QHS   FLUoxetine  20 mg Oral Daily   folic acid  1 mg Oral Daily   furosemide  20 mg Intravenous Daily   iron polysaccharides  150 mg Oral Daily   levothyroxine  88 mcg Oral Q0600   multivitamin with minerals  1 tablet Oral Daily   pantoprazole  40 mg Oral Daily   polyethylene glycol  17 g Oral BID   pramipexole  2 mg Oral TID   predniSONE  40 mg Oral Q breakfast   thiamine  100 mg Oral Daily   Or   thiamine  100 mg Intravenous Daily   Continuous Infusions:  0.9 % NaCl with KCl 20 mEq / L 75 mL/hr at 03/17/23 0108   piperacillin-tazobactam (ZOSYN)  IV 3.375 g (03/18/23 1026)   vancomycin 1,500 mg (03/17/23 2051)     LOS: 2 days    Time spent: 55 minutes.    Berton Mount, MD  [facility] Pager #: [phone] 7PM-7AM contact night coverage as above

## 2023-03-19 DIAGNOSIS — M25552 Pain in left hip: Secondary | ICD-10-CM | POA: Diagnosis not present

## 2023-03-19 LAB — CBC
HCT: 28.8 % — ABNORMAL LOW (ref 36.0–46.0)
Hemoglobin: 9 g/dL — ABNORMAL LOW (ref 12.0–15.0)
MCH: 30.5 pg (ref 26.0–34.0)
MCHC: 31.3 g/dL (ref 30.0–36.0)
MCV: 97.6 fL (ref 80.0–100.0)
Platelets: 311 10*3/uL (ref 150–400)
RBC: 2.95 MIL/uL — ABNORMAL LOW (ref 3.87–5.11)
RDW: 14.9 % (ref 11.5–15.5)
WBC: 7.8 10*3/uL (ref 4.0–10.5)
nRBC: 0 % (ref 0.0–0.2)

## 2023-03-19 LAB — TYPE AND SCREEN
ABO/RH(D): O POS
Antibody Screen: NEGATIVE

## 2023-03-19 LAB — MAGNESIUM: Magnesium: 2.3 mg/dL (ref 1.7–2.4)

## 2023-03-19 LAB — BASIC METABOLIC PANEL
Anion gap: 7 (ref 5–15)
BUN: 12 mg/dL (ref 8–23)
CO2: 29 mmol/L (ref 22–32)
Calcium: 8.5 mg/dL — ABNORMAL LOW (ref 8.9–10.3)
Chloride: 102 mmol/L (ref 98–111)
Creatinine, Ser: 0.49 mg/dL (ref 0.44–1.00)
GFR, Estimated: >60 mL/min (ref >60)
Glucose, Bld: 107 mg/dL — ABNORMAL HIGH (ref 70–99)
Potassium: 3.5 mmol/L (ref 3.5–5.1)
Sodium: 138 mmol/L (ref 135–145)

## 2023-03-19 LAB — PHOSPHORUS: Phosphorus: 3 mg/dL (ref 2.5–4.6)

## 2023-03-19 MED ORDER — POVIDONE-IODINE 10 % EX SWAB: 2.0000 | Freq: Once | CUTANEOUS | Status: DC

## 2023-03-19 MED ORDER — DEXAMETHASONE SODIUM PHOSPHATE 10 MG/ML IJ SOLN
8.0000 mg | Freq: Once | INTRAMUSCULAR | Status: AC
  Administered 2023-03-19: 8 mg via INTRAVENOUS
  Filled 2023-03-19: qty 1

## 2023-03-19 MED ORDER — TRANEXAMIC ACID-NACL 1000-0.7 MG/100ML-% IV SOLN
1000.0000 mg | INTRAVENOUS | Status: AC
  Administered 2023-03-20: 1000 mg via INTRAVENOUS

## 2023-03-19 MED ORDER — CHLORHEXIDINE GLUCONATE 4 % EX SOLN
60.0000 mL | Freq: Once | CUTANEOUS | Status: AC
  Administered 2023-03-20: 4 via TOPICAL
  Filled 2023-03-19: qty 60

## 2023-03-19 MED ORDER — LEVOFLOXACIN IN D5W 500 MG/100ML IV SOLN
500.0000 mg | INTRAVENOUS | Status: AC
  Administered 2023-03-20: 500 mg via INTRAVENOUS
  Filled 2023-03-19: qty 100

## 2023-03-19 NOTE — Progress Notes (Signed)
Patient ID: Kendra [name], female   DOB: [birth year], 77 y.o.   MRN: [id number]  She is up in recliner this am Discussed her activity level yesterday  I have reviewed the Xrays with her comparing her initial post op pelvis to her new films.  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  I discussed with her that I would like to take her back to the operating room to address this shifted shell tomorrow.  I have reviewed possible etiologies and the fact that this is uncommon enough that I do not have a great explanation but that no matter what I feel it needs to be addressed.  Orders to be placed for OR tomorrow

## 2023-03-19 NOTE — Progress Notes (Signed)
PT Cancellation Note  Patient Details Name: Kendra [name] MRN: [id number] DOB: 13-Jun-1946   Cancelled Treatment:    Reason Eval/Treat Not Completed: Medical issues which prohibited therapy (noted pt is to have surgery tomorrow, pt stated she was told not to walk today. Will re-evaluate after surgery.)  Tamala Ser PT 03/19/2023  Acute Rehabilitation Services  Office [phone]

## 2023-03-19 NOTE — Plan of Care (Signed)
  Problem: Education: Goal: Knowledge of General Education information will improve Description: Including pain rating scale, medication(s)/side effects and non-pharmacologic comfort measures Outcome: Adequate for Discharge   Problem: Health Behavior/Discharge Planning: Goal: Ability to manage health-related needs will improve Outcome: Adequate for Discharge   Problem: Clinical Measurements: Goal: Ability to maintain clinical measurements within normal limits will improve Outcome: Progressing Goal: Will remain free from infection Outcome: Progressing Goal: Diagnostic test results will improve Outcome: Progressing Goal: Respiratory complications will improve Outcome: Progressing Goal: Cardiovascular complication will be avoided Outcome: Progressing   Problem: Activity: Goal: Risk for activity intolerance will decrease Outcome: Adequate for Discharge   Problem: Nutrition: Goal: Adequate nutrition will be maintained Outcome: Completed/Met   Problem: Coping: Goal: Level of anxiety will decrease Outcome: Progressing   Problem: Elimination: Goal: Will not experience complications related to bowel motility Outcome: Progressing Goal: Will not experience complications related to urinary retention Outcome: Completed/Met   Problem: Pain Managment: Goal: General experience of comfort will improve Outcome: Progressing   Problem: Safety: Goal: Ability to remain free from injury will improve Outcome: Progressing   Problem: Skin Integrity: Goal: Risk for impaired skin integrity will decrease Outcome: Progressing   Problem: Activity: Goal: Ability to avoid complications of mobility impairment will improve Outcome: Adequate for Discharge Goal: Ability to tolerate increased activity will improve Outcome: Adequate for Discharge   Problem: Clinical Measurements: Goal: Postoperative complications will be avoided or minimized Outcome: Progressing   Problem: Pain  Management: Goal: Pain level will decrease with appropriate interventions Outcome: Adequate for Discharge   Problem: Skin Integrity: Goal: Will show signs of wound healing Outcome: Progressing

## 2023-03-19 NOTE — Progress Notes (Signed)
Pharmacy Antibiotic Note  Kendra [name] is a 77 y.o. female admitted on 03/16/2023 with cellulitis.  Pharmacy has been consulted for Vanco dosing.   ID: Cellulitis + fluid collection on CT. Right elbow seroma does not appear infected - Afebrile, WBC WNL. Scr <1  Antimicrobials: 8/19 (started at [facility]) Vancomycin >>  8/22 Zosyn >>   Microbiology results: 8/22 BCx: ngtd  [facility] Dosing Vanc 2gm x 1 on 8/19, then they were dosing 1500mg  q18h, last dose was 8/21 @ 2000  Plan: Vanco 1500mg  q24h (AUC 478.6, Scr 0.8) Surgery 8/25-acetabular shell shifted  Check levels if continued after OR tomorrow.    Height: 5\' 8"  (172.7 cm) Weight: 113.4 kg (250 lb) IBW/kg (Calculated) : 63.9  Temp (24hrs), Avg:98.3 F (36.8 C), Min:98.1 F (36.7 C), Max:98.5 F (36.9 C)  Recent Labs  Lab 03/17/23 0106 03/17/23 0359 03/18/23 0336 03/19/23 0333  WBC 9.7 9.3 10.3 7.8  CREATININE 0.44 0.42* 0.59 0.49    Estimated Creatinine Clearance: 80.3 mL/min (by C-G formula based on SCr of 0.49 mg/dL).    Allergies  Allergen Reactions   Bee Venom Anaphylaxis   Feldene [Piroxicam]    Flexeril [Cyclobenzaprine] Nausea Only   Cephalosporins Rash   Keflex [Cephalexin] Rash    Hildred Mollica S. Merilynn Finland, PharmD, BCPS Clinical Staff Pharmacist [email or web address]  Pasty Spillers 03/19/2023 10:42 AM

## 2023-03-19 NOTE — Progress Notes (Signed)
PROGRESS NOTE    Kendra [name]  VWU:[id number] DOB: [birth year] DOA: 03/16/2023 PCP: Krystal Clark, NP  Outpatient Specialists:     Brief Narrative:  Patient is a 77 year old female transferred from [facility].  Past medical history significant for hypothyroidism, depression, sleep apnea, morbid obesity, GERD.  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  Patient initially did well.  Patient presented with a sharp pain in her left hip that she had never had before.  The pain has persisted, with no improvement.  Ortho was contacted, the thought was this was normal postop pain and swelling.  Patient also has cellulitis.  Vascular Doppler ultrasound of lower extremities negative for DVT.At [facility] Doppler study done was negative for DVT.    03/18/2023: Patient seen.  Left lower extremity cellulitis is improving.  Will continue IV antibiotics for now.  Orthopedic input is appreciated.  Patient also reports migraine headache.  Patient takes Imitrex at home.  Will start patient on as needed Imitrex.  Orthopedic team is directing management of left hip collection and pain.  03/19/2023: Leg edema and left lower extremity cellulitis continues to improve.  Continue antibiotics.  Orthopedic team plans to take patient to the OR tomorrow.  Patient was seen alongside patient's daughter.   Assessment & Plan:   Principal Problem:   Left hip pain Active Problems:   Depression   Morbid obesity (HCC)   Sleep apnea   Benign essential hypertension   High risk medication use    Left hip pain //  Fluid collection on CT //  Cellulitis: -sp THR left hip. -DDx postop seroma, hematoma, abscess.  CT imaging from [facility] not available.  Repeat CT ordered for a.m. -Blood cultures x 2 ordered.  IV Rocephin and vancomycin [pharmacy dosing] -Pain meds as needed -PureWick ordered. -LLE Doppler done OSH negative DVT -Consult physician on-call for [facility] in a.m. 03/18/2023:  Orthopedic input is appreciated.  Orthopedic is directing care.  Cellulitis has improved.  Continue antibiotics. 03/19/2023: Continue antibiotics.  For surgery tomorrow as per orthopedic team.   Right elbow seroma: -Does not appear infected.  X-ray ordered.  To be discussed with Ortho, inpt vs outpt evaluation.   Hypothyroidism: -Synthroid resumed   Anxiety and depression: -Patient q. hours sleep Xanax resumed. -Fluoxetine resumed   Morbid obesity (HCC): -On Ozempic   OSA: -CPAP ordered, patient has own   Alcohol use: -Patient states she drinks half bottle nightly. -CIWA initiated.  No clear evidence of withdrawal.  Last drink on Sunday.   DVT prophylaxis: SCD. Code Status: Full code. Family Communication:  Disposition Plan: This will depend on hospital course   Consultants:  Orthopedic surgery  Procedures:  None  Antimicrobials:  IV vancomycin. IV Zosyn.   Subjective: Left lower extremity cellulitis and swelling improving.  Left hip pain  Objective: Vitals:   03/18/23 0550 03/18/23 1411 03/18/23 2228 03/19/23 0450  BP: 134/72 113/64 (!) 142/69 137/77  Pulse: 72 67 (!) 56 (!) 59  Resp: 15 17 17 17   Temp: 98.3 F (36.8 C) 98.1 F (36.7 C) 98.5 F (36.9 C) 98.2 F (36.8 C)  TempSrc: Oral Oral Oral Oral  SpO2: 94% 93% 95% 93%  Weight:      Height:        Intake/Output Summary (Last 24 hours) at 03/19/2023 1327 Last data filed at 03/19/2023 1135 Gross per 24 hour  Intake 677.27 ml  Output 1750 ml  Net -1072.73 ml   Filed Weights   03/16/23  2300  Weight: 113.4 kg    Examination:  General exam: Appears calm and comfortable.  Patient is obese. Respiratory system: Clear to auscultation. Cardiovascular system: S1 & S2 heard. Gastrointestinal system: Abdomen is obese, soft and nontender.   Central nervous system: Awake and alert.  Patient moves all extremities.   Extremities: Left lower extremity cellulitis and swelling improving.   Data  Reviewed: I have personally reviewed following labs and imaging studies  CBC: Recent Labs  Lab 03/17/23 0106 03/17/23 0359 03/18/23 0336 03/19/23 0333  WBC 9.7 9.3 10.3 7.8  NEUTROABS 7.1 6.8  --   --   HGB 9.7* 8.8* 9.5* 9.0*  HCT 30.8* 28.0* 30.0* 28.8*  MCV 97.5 97.2 96.8 97.6  PLT 266 269 277 311   Basic Metabolic Panel: Recent Labs  Lab 03/17/23 0106 03/17/23 0359 03/18/23 0336 03/19/23 0333  NA 134* 132* 134* 138  K 3.5 3.5 3.6 3.5  CL 99 98 100 102  CO2 26 26 27 29   GLUCOSE 117* 109* 142* 107*  BUN 9 8 10 12   CREATININE 0.44 0.42* 0.59 0.49  CALCIUM 8.6* 8.2* 8.5* 8.5*  MG 2.1  --  2.1 2.3  PHOS  --   --  4.4 3.0   GFR: Estimated Creatinine Clearance: 80.3 mL/min (by C-G formula based on SCr of 0.49 mg/dL). Liver Function Tests: Recent Labs  Lab 03/17/23 0106  AST 12*  ALT 11  ALKPHOS 80  BILITOT 1.2  PROT 6.0*  ALBUMIN 3.2*   No results for input(s): "LIPASE", "AMYLASE" in the last 168 hours. No results for input(s): "AMMONIA" in the last 168 hours. Coagulation Profile: Recent Labs  Lab 03/17/23 0359  INR 1.3*   Cardiac Enzymes: No results for input(s): "CKTOTAL", "CKMB", "CKMBINDEX", "TROPONINI" in the last 168 hours. BNP (last 3 results) No results for input(s): "PROBNP" in the last 8760 hours. HbA1C: Recent Labs    03/17/23 0410  HGBA1C 4.9   CBG: Recent Labs  Lab 03/17/23 1232 03/17/23 1632 03/17/23 2132 03/18/23 0721  GLUCAP 95 110* 185* 129*   Lipid Profile: No results for input(s): "CHOL", "HDL", "LDLCALC", "TRIG", "CHOLHDL", "LDLDIRECT" in the last 72 hours. Thyroid Function Tests: No results for input(s): "TSH", "T4TOTAL", "FREET4", "T3FREE", "THYROIDAB" in the last 72 hours. Anemia Panel: Recent Labs    03/17/23 1025  VITAMINB12 506  FOLATE 9.0  TIBC 276  IRON 19*   Urine analysis:    Component Value Date/Time   COLORURINE STRAW (A) 12/08/2020 1108   APPEARANCEUR CLEAR 12/08/2020 1108   LABSPEC 1.006  12/08/2020 1108   PHURINE 7.0 12/08/2020 1108   GLUCOSEU NEGATIVE 12/08/2020 1108   HGBUR NEGATIVE 12/08/2020 1108   BILIRUBINUR NEGATIVE 12/08/2020 1108   KETONESUR NEGATIVE 12/08/2020 1108   PROTEINUR NEGATIVE 12/08/2020 1108   NITRITE NEGATIVE 12/08/2020 1108   LEUKOCYTESUR NEGATIVE 12/08/2020 1108   Sepsis Labs: @LABRCNTIP (procalcitonin:4,lacticidven:4)  ) Recent Results (from the past 240 hour(s))  Culture, blood (Routine X 2) w Reflex to ID Panel     Status: None (Preliminary result)   Collection Time: 03/17/23  1:06 AM   Specimen: BLOOD  Result Value Ref Range Status   Specimen Description   Final    BLOOD BLOOD RIGHT ARM Performed at [facility], [address] [place], [place] [address]    Special Requests   Final    BOTTLES DRAWN AEROBIC AND ANAEROBIC Blood Culture adequate volume Performed at [facility], [address], [place], [place] [address]  Culture   Final    NO GROWTH 2 DAYS Performed at [facility], [address], [place], [place] [address]    Report Status PENDING  Incomplete  Culture, blood (Routine X 2) w Reflex to ID Panel     Status: None (Preliminary result)   Collection Time: 03/17/23  1:07 AM   Specimen: BLOOD  Result Value Ref Range Status   Specimen Description   Final    BLOOD BLOOD RIGHT HAND Performed at [facility], [address] [place], [place] [address]    Special Requests   Final    BOTTLES DRAWN AEROBIC AND ANAEROBIC Blood Culture adequate volume Performed at [facility], [address] [place], [place] [address]    Culture   Final    NO GROWTH 2 DAYS Performed at [facility], [address] [place], [place] [address]    Report Status PENDING  Incomplete         Radiology Studies: DG HIP UNILAT WITH PELVIS 1V LEFT  Result Date: 03/17/2023 CLINICAL DATA:  Left hip pain.  Postoperative 03/01/2023. EXAM: DG HIP (WITH OR  WITHOUT PELVIS) 1V*L* COMPARISON:  Pelvis and left hip radiographs 03/14/2023, left hip intraoperative fluoroscopy 03/01/2023; CT left hip 03/16/2023 FINDINGS: Postsurgical changes are again seen of total left hip arthroplasty. The hardware positioning appears not significantly changed from 03/14/2023. The acetabular cup may be more vertically inclined compared to the intraoperative placement on intraoperative fluoroscopic images 03/01/2023. There is associated mild uncovering of the superolateral aspect of left femoral head. Subtle lucency within the superior left acetabulum bordering the superomedial left acetabular cup, unchanged from intraoperative radiographs and again favored to be present prior to the recent left acetabular club placement. Moderate pubic symphysis joint space narrowing and subchondral sclerosis. No acute fracture or dislocation. 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 The acetabular cup may be more vertically inclined compared to the intraoperative placement on intraoperative fluoroscopic images 03/01/2023. The appearance is unchanged from 03/14/2023 frontal radiograph. Electronically Signed   By: Neita Garnet M.D.   On: 03/17/2023 16:45        Scheduled Meds:  ascorbic acid  500 mg Oral Daily   bisacodyl  10 mg Oral QHS   FLUoxetine  20 mg Oral Daily   folic acid  1 mg Oral Daily   furosemide  20 mg Intravenous Daily   iron polysaccharides  150 mg Oral Daily   latanoprost  1 drop Both Eyes QHS   levothyroxine  88 mcg Oral Q0600   multivitamin with minerals  1 tablet Oral Daily   pantoprazole  40 mg Oral Daily   polyethylene glycol  17 g Oral BID   pramipexole  2 mg Oral TID   thiamine  100 mg Oral Daily   Or   thiamine  100 mg Intravenous Daily   Continuous Infusions:  0.9 % NaCl with KCl 20 mEq / L 20 mL/hr at 03/19/23 0220   piperacillin-tazobactam (ZOSYN)  IV 3.375 g (03/19/23 1001)   vancomycin 1,500 mg (03/18/23 2057)     LOS: 3  days    Time spent: 35 minutes.    Berton Mount, MD  [facility] Pager #: [phone] 7PM-7AM contact night coverage as above

## 2023-03-20 ENCOUNTER — Encounter (HOSPITAL_COMMUNITY)
Admission: AD | Disposition: A | Discharge status: Skilled Nursing Facility | Payer: Self-pay | Source: Other Acute Inpatient Hospital | Attending: Internal Medicine

## 2023-03-20 ENCOUNTER — Inpatient Hospital Stay (HOSPITAL_COMMUNITY): Payer: Medicare Other

## 2023-03-20 ENCOUNTER — Inpatient Hospital Stay (HOSPITAL_COMMUNITY): Payer: Medicare Other | Admitting: Anesthesiology

## 2023-03-20 ENCOUNTER — Other Ambulatory Visit: Payer: Self-pay

## 2023-03-20 DIAGNOSIS — I10 Essential (primary) hypertension: Secondary | ICD-10-CM

## 2023-03-20 DIAGNOSIS — G4730 Sleep apnea, unspecified: Secondary | ICD-10-CM

## 2023-03-20 DIAGNOSIS — T84091A Other mechanical complication of internal left hip prosthesis, initial encounter: Secondary | ICD-10-CM

## 2023-03-20 DIAGNOSIS — Z87891 Personal history of nicotine dependence: Secondary | ICD-10-CM | POA: Diagnosis not present

## 2023-03-20 DIAGNOSIS — M25552 Pain in left hip: Secondary | ICD-10-CM | POA: Diagnosis not present

## 2023-03-20 HISTORY — PX: TOTAL HIP ARTHROPLASTY: SHX124

## 2023-03-20 LAB — BASIC METABOLIC PANEL
Anion gap: 8 (ref 5–15)
BUN: 13 mg/dL (ref 8–23)
CO2: 26 mmol/L (ref 22–32)
Calcium: 8.3 mg/dL — ABNORMAL LOW (ref 8.9–10.3)
Chloride: 100 mmol/L (ref 98–111)
Creatinine, Ser: 0.48 mg/dL (ref 0.44–1.00)
GFR, Estimated: >60 mL/min (ref >60)
Glucose, Bld: 166 mg/dL — ABNORMAL HIGH (ref 70–99)
Potassium: 3.7 mmol/L (ref 3.5–5.1)
Sodium: 134 mmol/L — ABNORMAL LOW (ref 135–145)

## 2023-03-20 LAB — CBC
HCT: 29.8 % — ABNORMAL LOW (ref 36.0–46.0)
Hemoglobin: 9.2 g/dL — ABNORMAL LOW (ref 12.0–15.0)
MCH: 30.2 pg (ref 26.0–34.0)
MCHC: 30.9 g/dL (ref 30.0–36.0)
MCV: 97.7 fL (ref 80.0–100.0)
Platelets: 353 10*3/uL (ref 150–400)
RBC: 3.05 MIL/uL — ABNORMAL LOW (ref 3.87–5.11)
RDW: 14.8 % (ref 11.5–15.5)
WBC: 7.2 10*3/uL (ref 4.0–10.5)
nRBC: 0 % (ref 0.0–0.2)

## 2023-03-20 LAB — PHOSPHORUS: Phosphorus: 3.2 mg/dL (ref 2.5–4.6)

## 2023-03-20 LAB — MAGNESIUM: Magnesium: 2.3 mg/dL (ref 1.7–2.4)

## 2023-03-20 SURGERY — ARTHROPLASTY, HIP, TOTAL, ANTERIOR APPROACH
Anesthesia: General | Site: Hip | Laterality: Left

## 2023-03-20 MED ORDER — PROMETHAZINE HCL 25 MG/ML IJ SOLN: 6.2500 mg | INTRAMUSCULAR | Status: DC | PRN

## 2023-03-20 MED ORDER — OXYCODONE HCL 5 MG PO TABS
10.0000 mg | ORAL_TABLET | ORAL | Status: DC | PRN
  Administered 2023-03-20 (×2): 10 mg via ORAL
  Administered 2023-03-21: 15 mg via ORAL
  Filled 2023-03-20: qty 3
  Filled 2023-03-20: qty 2

## 2023-03-20 MED ORDER — METHOCARBAMOL 500 MG IVPB - SIMPLE MED
INTRAVENOUS | Status: AC
  Filled 2023-03-20: qty 55

## 2023-03-20 MED ORDER — DEXAMETHASONE SODIUM PHOSPHATE 10 MG/ML IJ SOLN
INTRAMUSCULAR | Status: DC | PRN
  Administered 2023-03-20: 8 mg via INTRAVENOUS

## 2023-03-20 MED ORDER — PROPOFOL 500 MG/50ML IV EMUL
INTRAVENOUS | Status: DC | PRN
  Administered 2023-03-20: 175 ug/kg/min via INTRAVENOUS

## 2023-03-20 MED ORDER — PROPOFOL 1000 MG/100ML IV EMUL
INTRAVENOUS | Status: AC
  Filled 2023-03-20: qty 100

## 2023-03-20 MED ORDER — LIDOCAINE 2% (20 MG/ML) 5 ML SYRINGE
INTRAMUSCULAR | Status: DC | PRN
  Administered 2023-03-20: 50 mg via INTRAVENOUS

## 2023-03-20 MED ORDER — OXYCODONE HCL 5 MG/5ML PO SOLN: 5.0000 mg | Freq: Once | ORAL | Status: DC | PRN

## 2023-03-20 MED ORDER — MIDAZOLAM HCL 2 MG/2ML IJ SOLN
INTRAMUSCULAR | Status: DC | PRN
  Administered 2023-03-20: 2 mg via INTRAVENOUS

## 2023-03-20 MED ORDER — ACETAMINOPHEN 10 MG/ML IV SOLN
INTRAVENOUS | Status: AC
  Filled 2023-03-20: qty 100

## 2023-03-20 MED ORDER — OXYCODONE HCL 5 MG PO TABS: 5.0000 mg | ORAL_TABLET | Freq: Once | ORAL | Status: DC | PRN

## 2023-03-20 MED ORDER — 0.9 % SODIUM CHLORIDE (POUR BTL) OPTIME
TOPICAL | Status: DC | PRN
  Administered 2023-03-20: 1000 mL

## 2023-03-20 MED ORDER — SUGAMMADEX SODIUM 200 MG/2ML IV SOLN
INTRAVENOUS | Status: DC | PRN
  Administered 2023-03-20: 230 mg via INTRAVENOUS

## 2023-03-20 MED ORDER — ROCURONIUM BROMIDE 10 MG/ML (PF) SYRINGE
PREFILLED_SYRINGE | INTRAVENOUS | Status: DC | PRN
  Administered 2023-03-20: 60 mg via INTRAVENOUS

## 2023-03-20 MED ORDER — TIZANIDINE HCL 4 MG PO TABS
2.0000 mg | ORAL_TABLET | Freq: Three times a day (TID) | ORAL | Status: DC | PRN
  Administered 2023-03-20: 2 mg via ORAL
  Filled 2023-03-20: qty 1

## 2023-03-20 MED ORDER — SODIUM CHLORIDE 0.9 % IV SOLN: INTRAVENOUS | Status: DC

## 2023-03-20 MED ORDER — ONDANSETRON HCL 4 MG/2ML IJ SOLN
INTRAMUSCULAR | Status: DC | PRN
  Administered 2023-03-20: 4 mg via INTRAVENOUS

## 2023-03-20 MED ORDER — FENTANYL CITRATE (PF) 100 MCG/2ML IJ SOLN
INTRAMUSCULAR | Status: DC | PRN
  Administered 2023-03-20: 50 ug via INTRAVENOUS
  Administered 2023-03-20 (×2): 25 ug via INTRAVENOUS

## 2023-03-20 MED ORDER — BUPIVACAINE-EPINEPHRINE 0.25% -1:200000 IJ SOLN
INTRAMUSCULAR | Status: AC
  Filled 2023-03-20: qty 1

## 2023-03-20 MED ORDER — PROPOFOL 10 MG/ML IV BOLUS
INTRAVENOUS | Status: AC
  Filled 2023-03-20: qty 20

## 2023-03-20 MED ORDER — SODIUM CHLORIDE (PF) 0.9 % IJ SOLN
INTRAMUSCULAR | Status: AC
  Filled 2023-03-20: qty 50

## 2023-03-20 MED ORDER — POLYETHYLENE GLYCOL 3350 17 G PO PACK
17.0000 g | PACK | Freq: Two times a day (BID) | ORAL | Status: DC
  Administered 2023-03-20 – 2023-03-21 (×2): 17 g via ORAL
  Filled 2023-03-20 (×2): qty 1

## 2023-03-20 MED ORDER — METOCLOPRAMIDE HCL 5 MG/ML IJ SOLN: 5.0000 mg | Freq: Three times a day (TID) | INTRAMUSCULAR | Status: DC | PRN

## 2023-03-20 MED ORDER — TRANEXAMIC ACID-NACL 1000-0.7 MG/100ML-% IV SOLN
1000.0000 mg | Freq: Once | INTRAVENOUS | Status: AC
  Administered 2023-03-20: 1000 mg via INTRAVENOUS
  Filled 2023-03-20: qty 100

## 2023-03-20 MED ORDER — ONDANSETRON HCL 4 MG PO TABS: 4.0000 mg | ORAL_TABLET | Freq: Four times a day (QID) | ORAL | Status: DC | PRN

## 2023-03-20 MED ORDER — ONDANSETRON HCL 4 MG/2ML IJ SOLN
INTRAMUSCULAR | Status: AC
  Filled 2023-03-20: qty 2

## 2023-03-20 MED ORDER — FENTANYL CITRATE (PF) 100 MCG/2ML IJ SOLN
INTRAMUSCULAR | Status: AC
  Filled 2023-03-20: qty 2

## 2023-03-20 MED ORDER — TRANEXAMIC ACID-NACL 1000-0.7 MG/100ML-% IV SOLN
INTRAVENOUS | Status: AC
  Filled 2023-03-20: qty 100

## 2023-03-20 MED ORDER — PRONTOSAN WOUND IRRIGATION OPTIME
TOPICAL | Status: DC | PRN
  Administered 2023-03-20: 1 via TOPICAL

## 2023-03-20 MED ORDER — ASPIRIN 81 MG PO CHEW
81.0000 mg | CHEWABLE_TABLET | Freq: Two times a day (BID) | ORAL | Status: DC
  Administered 2023-03-20 – 2023-03-21 (×2): 81 mg via ORAL
  Filled 2023-03-20 (×2): qty 1

## 2023-03-20 MED ORDER — METOCLOPRAMIDE HCL 5 MG PO TABS: 5.0000 mg | ORAL_TABLET | Freq: Three times a day (TID) | ORAL | Status: DC | PRN

## 2023-03-20 MED ORDER — VANCOMYCIN HCL 1 G IV SOLR
INTRAVENOUS | Status: DC | PRN
  Administered 2023-03-20: 1000 mg via TOPICAL

## 2023-03-20 MED ORDER — ACETAMINOPHEN 325 MG PO TABS
650.0000 mg | ORAL_TABLET | Freq: Four times a day (QID) | ORAL | Status: DC
  Administered 2023-03-20 – 2023-03-21 (×3): 650 mg via ORAL
  Filled 2023-03-20 (×3): qty 2

## 2023-03-20 MED ORDER — KETOROLAC TROMETHAMINE 30 MG/ML IJ SOLN
INTRAMUSCULAR | Status: AC
  Filled 2023-03-20: qty 1

## 2023-03-20 MED ORDER — ROCURONIUM BROMIDE 10 MG/ML (PF) SYRINGE
PREFILLED_SYRINGE | INTRAVENOUS | Status: AC
  Filled 2023-03-20: qty 10

## 2023-03-20 MED ORDER — OXYCODONE HCL 5 MG PO TABS
5.0000 mg | ORAL_TABLET | ORAL | Status: DC | PRN
  Administered 2023-03-21 (×2): 10 mg via ORAL
  Filled 2023-03-20 (×3): qty 2

## 2023-03-20 MED ORDER — MIDAZOLAM HCL 2 MG/2ML IJ SOLN
INTRAMUSCULAR | Status: AC
  Filled 2023-03-20: qty 2

## 2023-03-20 MED ORDER — HYDROMORPHONE HCL 1 MG/ML IJ SOLN
0.5000 mg | INTRAMUSCULAR | Status: DC | PRN
  Administered 2023-03-21: 1 mg via INTRAVENOUS
  Filled 2023-03-20: qty 1

## 2023-03-20 MED ORDER — LIDOCAINE HCL (PF) 2 % IJ SOLN
INTRAMUSCULAR | Status: AC
  Filled 2023-03-20: qty 5

## 2023-03-20 MED ORDER — SODIUM CHLORIDE (PF) 0.9 % IJ SOLN
INTRAMUSCULAR | Status: DC | PRN
  Administered 2023-03-20: 61 mL

## 2023-03-20 MED ORDER — DOCUSATE SODIUM 100 MG PO CAPS
100.0000 mg | ORAL_CAPSULE | Freq: Two times a day (BID) | ORAL | Status: DC
  Administered 2023-03-20 – 2023-03-21 (×2): 100 mg via ORAL
  Filled 2023-03-20 (×2): qty 1

## 2023-03-20 MED ORDER — ACETAMINOPHEN 10 MG/ML IV SOLN
INTRAVENOUS | Status: DC | PRN
Start: 2023-03-20 — End: 2023-03-20
  Administered 2023-03-20: 1000 mg via INTRAVENOUS

## 2023-03-20 MED ORDER — STERILE WATER FOR IRRIGATION IR SOLN
Status: DC | PRN
  Administered 2023-03-20: 2000 mL

## 2023-03-20 MED ORDER — MENTHOL 3 MG MT LOZG: 1.0000 | LOZENGE | OROMUCOSAL | Status: DC | PRN

## 2023-03-20 MED ORDER — BISACODYL 10 MG RE SUPP
10.0000 mg | Freq: Every day | RECTAL | Status: DC | PRN
  Administered 2023-03-21: 10 mg via RECTAL
  Filled 2023-03-20: qty 1

## 2023-03-20 MED ORDER — METHOCARBAMOL 500 MG IVPB - SIMPLE MED
500.0000 mg | Freq: Four times a day (QID) | INTRAVENOUS | Status: DC | PRN
  Administered 2023-03-20: 500 mg via INTRAVENOUS

## 2023-03-20 MED ORDER — HYDROMORPHONE HCL 1 MG/ML IJ SOLN
INTRAMUSCULAR | Status: AC
  Filled 2023-03-20: qty 2

## 2023-03-20 MED ORDER — PROPOFOL 10 MG/ML IV BOLUS: INTRAVENOUS | Status: DC | PRN

## 2023-03-20 MED ORDER — METHOCARBAMOL 500 MG PO TABS: 500.0000 mg | ORAL_TABLET | Freq: Four times a day (QID) | ORAL | Status: DC | PRN

## 2023-03-20 MED ORDER — ONDANSETRON HCL 4 MG/2ML IJ SOLN: 4.0000 mg | Freq: Four times a day (QID) | INTRAMUSCULAR | Status: DC | PRN

## 2023-03-20 MED ORDER — LACTATED RINGERS IV SOLN: INTRAVENOUS | Status: DC | PRN

## 2023-03-20 MED ORDER — HYDROMORPHONE HCL 1 MG/ML IJ SOLN
0.2500 mg | INTRAMUSCULAR | Status: DC | PRN
  Administered 2023-03-20 (×4): 0.5 mg via INTRAVENOUS

## 2023-03-20 MED ORDER — VANCOMYCIN HCL 1000 MG IV SOLR
INTRAVENOUS | Status: AC
  Filled 2023-03-20: qty 20

## 2023-03-20 MED ORDER — DIPHENHYDRAMINE HCL 12.5 MG/5ML PO ELIX: 12.5000 mg | ORAL_SOLUTION | ORAL | Status: DC | PRN

## 2023-03-20 MED ORDER — PROPOFOL 10 MG/ML IV BOLUS
INTRAVENOUS | Status: DC | PRN
  Administered 2023-03-20: 150 mg via INTRAVENOUS

## 2023-03-20 MED ORDER — AMISULPRIDE (ANTIEMETIC) 5 MG/2ML IV SOLN: 10.0000 mg | Freq: Once | INTRAVENOUS | Status: DC | PRN

## 2023-03-20 MED ORDER — DEXAMETHASONE SODIUM PHOSPHATE 10 MG/ML IJ SOLN
10.0000 mg | Freq: Once | INTRAMUSCULAR | Status: AC
  Administered 2023-03-21: 10 mg via INTRAVENOUS
  Filled 2023-03-20: qty 1

## 2023-03-20 MED ORDER — DEXAMETHASONE SODIUM PHOSPHATE 10 MG/ML IJ SOLN
INTRAMUSCULAR | Status: AC
  Filled 2023-03-20: qty 1

## 2023-03-20 MED ORDER — PHENOL 1.4 % MT LIQD: 1.0000 | OROMUCOSAL | Status: DC | PRN

## 2023-03-20 SURGICAL SUPPLY — 43 items
ADH SKN CLS APL DERMABOND .7 (GAUZE/BANDAGES/DRESSINGS) ×1
BAG COUNTER SPONGE SURGICOUNT (BAG) IMPLANT
BAG SPEC THK2 15X12 ZIP CLS (MISCELLANEOUS)
BAG SPNG CNTER NS LX DISP (BAG)
BAG ZIPLOCK 12X15 (MISCELLANEOUS) IMPLANT
BLADE SAG 18X100X1.27 (BLADE) ×2 IMPLANT
COVER PERINEAL POST (MISCELLANEOUS) ×2 IMPLANT
COVER SURGICAL LIGHT HANDLE (MISCELLANEOUS) ×2 IMPLANT
CUP ACET PNNCL SECTR W/GRIP 56 (Hips) IMPLANT
DERMABOND ADVANCED .7 DNX12 (GAUZE/BANDAGES/DRESSINGS) ×2 IMPLANT
DRAPE FOOT SWITCH (DRAPES) ×2 IMPLANT
DRAPE STERI IOBAN 125X83 (DRAPES) ×2 IMPLANT
DRAPE U-SHAPE 47X51 STRL (DRAPES) ×4 IMPLANT
DRESSING AQUACEL AG SP 3.5X10 (GAUZE/BANDAGES/DRESSINGS) ×2 IMPLANT
DRSG AQUACEL AG ADV 3.5X10 (GAUZE/BANDAGES/DRESSINGS) IMPLANT
DRSG AQUACEL AG SP 3.5X10 (GAUZE/BANDAGES/DRESSINGS) ×1
DURAPREP 26ML APPLICATOR (WOUND CARE) ×2 IMPLANT
ELECT REM PT RETURN 15FT ADLT (MISCELLANEOUS) ×2 IMPLANT
GLOVE BIO SURGEON STRL SZ 6 (GLOVE) ×2 IMPLANT
GLOVE BIOGEL PI IND STRL 6.5 (GLOVE) ×2 IMPLANT
GLOVE BIOGEL PI IND STRL 7.5 (GLOVE) ×2 IMPLANT
GLOVE ORTHO TXT STRL SZ7.5 (GLOVE) ×4 IMPLANT
GOWN STRL REUS W/ TWL LRG LVL3 (GOWN DISPOSABLE) ×4 IMPLANT
GOWN STRL REUS W/TWL LRG LVL3 (GOWN DISPOSABLE) ×2
HEAD CERAMIC BIO DELTA 36 (Hips) IMPLANT
HOLDER FOLEY CATH W/STRAP (MISCELLANEOUS) ×2 IMPLANT
KIT TURNOVER KIT A (KITS) IMPLANT
NDL SAFETY ECLIP 18X1.5 (MISCELLANEOUS) IMPLANT
PACK ANTERIOR HIP CUSTOM (KITS) ×2 IMPLANT
PINN SECTOR W/GRIP ACE CUP 56 (Hips) ×1 IMPLANT
PINNACLE ALTRX PLUS 4 N 36X56 (Hips) IMPLANT
SCREW 6.5MMX25MM (Screw) IMPLANT
SCREW 6.5MMX40MM (Screw) IMPLANT
SUT MNCRL AB 4-0 PS2 18 (SUTURE) ×2 IMPLANT
SUT STRATAFIX 0 PDS 27 VIOLET (SUTURE) ×1
SUT VIC AB 1 CT1 36 (SUTURE) ×6 IMPLANT
SUT VIC AB 2-0 CT1 27 (SUTURE) ×2
SUT VIC AB 2-0 CT1 TAPERPNT 27 (SUTURE) ×4 IMPLANT
SUTURE STRATFX 0 PDS 27 VIOLET (SUTURE) ×2 IMPLANT
SYR 3ML LL SCALE MARK (SYRINGE) IMPLANT
TRAY FOLEY MTR SLVR 16FR STAT (SET/KITS/TRAYS/PACK) IMPLANT
TUBE SUCTION HIGH CAP CLEAR NV (SUCTIONS) ×2 IMPLANT
WATER STERILE IRR 1000ML POUR (IV SOLUTION) ×2 IMPLANT

## 2023-03-20 NOTE — Anesthesia Preprocedure Evaluation (Signed)
Anesthesia Evaluation  Patient identified by MRN, date of birth, ID band Patient awake    Reviewed: Allergy & Precautions, NPO status , Patient's Chart, lab work & pertinent test results  History of Anesthesia Complications (+) PONV, POST - OP SPINAL HEADACHE and history of anesthetic complications  Airway Mallampati: III  TM Distance: <3 FB Neck ROM: Full  Mouth opening: Limited Mouth Opening  Dental  (+) Teeth Intact, Dental Advisory Given   Pulmonary sleep apnea , former smoker   breath sounds clear to auscultation       Cardiovascular hypertension, Pt. on medications  Rhythm:Regular Rate:Normal     Neuro/Psych  Headaches PSYCHIATRIC DISORDERS  Depression       GI/Hepatic Neg liver ROS,GERD  Medicated,,  Endo/Other  Hypothyroidism    Renal/GU negative Renal ROS     Musculoskeletal  (+) Arthritis , Osteoarthritis,    Abdominal  (+) + obese  Peds  Hematology  (+) Blood dyscrasia, anemia   Anesthesia Other Findings   Reproductive/Obstetrics                             Anesthesia Physical Anesthesia Plan  ASA: 3  Anesthesia Plan: General   Post-op Pain Management: Dilaudid IV   Induction: Intravenous  PONV Risk Score and Plan: 4 or greater and Ondansetron, Treatment may vary due to age or medical condition, Propofol infusion, TIVA and Dexamethasone  Airway Management Planned: Oral ETT  Additional Equipment: None  Intra-op Plan:   Post-operative Plan: Extubation in OR  Informed Consent: I have reviewed the patients History and Physical, chart, labs and discussed the procedure including the risks, benefits and alternatives for the proposed anesthesia with the patient or authorized representative who has indicated his/her understanding and acceptance.       Plan Discussed with: CRNA  Anesthesia Plan Comments: (Spinal cord stimulator in place and functioning. Will proceed  with GA w/ TIVA to avoid damaging leads.   Lab Results      Component                Value               Date                      WBC                      7.9                 02/16/2023                HGB                      13.1                02/16/2023                HCT                      39.5                02/16/2023                MCV                      94.7  02/16/2023                PLT                      272                 02/16/2023           )       Anesthesia Quick Evaluation

## 2023-03-20 NOTE — Progress Notes (Signed)
   03/20/23 2314  BiPAP/CPAP/SIPAP  BiPAP/CPAP/SIPAP Pt Type Adult (self placement)  BiPAP/CPAP/SIPAP Resmed  Mask Type Nasal pillows  EPAP 12 cmH2O  FiO2 (%) 21 %  Patient Home Equipment Yes  Auto Titrate No

## 2023-03-20 NOTE — Anesthesia Postprocedure Evaluation (Signed)
Anesthesia Post Note  Patient: Kendra [name]  Procedure(s) Performed: TOTAL HIP ARTHROPLASTY ANTERIOR APPROACH (Left: Hip)     Patient location during evaluation: PACU Anesthesia Type: General Level of consciousness: awake and alert Pain management: pain level controlled Vital Signs Assessment: post-procedure vital signs reviewed and stable Respiratory status: spontaneous breathing, nonlabored ventilation and respiratory function stable Cardiovascular status: blood pressure returned to baseline and stable Postop Assessment: no apparent nausea or vomiting Anesthetic complications: no   No notable events documented.  Last Vitals:  Vitals:   03/20/23 1815 03/20/23 1830  BP: (!) 152/70 (!) 152/66  Pulse: 63 (!) 54  Resp: (!) 23 11  Temp:    SpO2: 92% 91%    Last Pain:  Vitals:   03/20/23 1820  TempSrc:   PainSc: 9                  Lowella Curb

## 2023-03-20 NOTE — Plan of Care (Signed)
  Problem: Activity: Goal: Risk for activity intolerance will decrease Outcome: Progressing   Problem: Elimination: Goal: Will not experience complications related to bowel motility Outcome: Progressing   Problem: Safety: Goal: Ability to remain free from injury will improve Outcome: Progressing   Problem: Education: Goal: Knowledge of the prescribed therapeutic regimen will improve Outcome: Progressing   Problem: Activity: Goal: Ability to avoid complications of mobility impairment will improve Outcome: Progressing   Problem: Pain Management: Goal: Pain level will decrease with appropriate interventions Outcome: Progressing

## 2023-03-20 NOTE — Transfer of Care (Signed)
Immediate Anesthesia Transfer of Care Note  Patient: Kendra [name]  Procedure(s) Performed: TOTAL HIP ARTHROPLASTY ANTERIOR APPROACH (Left: Hip)  Patient Location: PACU  Anesthesia Type:General  Level of Consciousness: awake, alert , and oriented  Airway & Oxygen Therapy: Patient Spontanous Breathing and Patient connected to face mask oxygen  Post-op Assessment: Report given to RN, Post -op Vital signs reviewed and stable, and Patient moving all extremities X 4  Post vital signs: Reviewed and stable  Last Vitals:  Vitals Value Taken Time  BP 118/69   Temp 36.1 C 03/20/23 1751  Pulse 66 03/20/23 1751  Resp 16 03/20/23 1751  SpO2 96 % 03/20/23 1751  Vitals shown include unfiled device data.  Last Pain:  Vitals:   03/20/23 1453  TempSrc: Oral  PainSc:       Patients Stated Pain Goal: 3 (03/20/23 1610)  Complications: No notable events documented.

## 2023-03-20 NOTE — Op Note (Unsigned)
NAMERACHEAL, MCCOMMON MEDICAL RECORD NO: [id number] ACCOUNT NO: [id number] DATE OF BIRTH: 1946-05-05 FACILITY: [facility]ATION: [id number] PHYSICIAN: Madlyn Frankel. 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  PROCEDURE:  Revision left total hip arthroplasty.  COMPONENTS USED:  DePuy Gription Sector shell, 56 mm with a 36+4 neutral AltrX liner.  Two cancellous screws were placed into the ilium with a size 36+12 delta ceramic ball.  SURGEON:  Madlyn Frankel. Charlann Boxer, MD  ASSISTANT:  Rosalene Billings, PA-C.  Note that Ms. Domenic Schwab was present for the entirety of the case from preoperative positioning, perioperative management of the operative extremity, general facilitation of the case and primary wound closure.  ANESTHESIA:  General.  BLOOD LOSS:  About 600 mL  DRAINS:  None.  COMPLICATIONS:  None.  INDICATIONS FOR PROCEDURE:  The patient is a pleasant 77 year old female with history of an index total hip arthroplasty on 03/01/2023.  She had reports of doing well initially.  She then reported a sharp pain in her left hip.  She had other issues  including significant lower extremity swelling bilaterally as well as multiple joint aches.  She was admitted to the hospital primarily for the lower extremity swelling and cellulitic concerns.  Based on her presentation and her history of doing well and  then having a sharp pain in the hip, radiographic evaluation was carried out.  Radiographs performed indicated concern for early loosening of her acetabular shell as it seemed to have kicked away from the medial wall of the acetabulum.  Given this  finding, I felt that it was necessary that we perform revision surgery on the acetabulum.  We discussed and reviewed the potential etiology of  this, even though it is very uncommon in my experience.  We discussed the risk of doing this including  recurrent issues with the components, dislocation, DVT, infection particularly in the setting of surgery at this 3-week interval from her index surgery.  Consent was obtained for management of this current situation.  DESCRIPTION OF PROCEDURE:  The patient was brought to the operative theater.  Once adequate anesthesia, preoperative antibiotics administered, which included Levaquin in addition to vancomycin, which she had been receiving on the floor for her cellulitic  diagnosis.  She was positioned supine on the Hana table.  All bony prominences were carefully padded.  Once this was done, the left hip was prepped and draped in sterile fashion.  A timeout was performed, identifying the patient, planned procedure and  extremity.  Her old incision was utilized and extended slightly proximal and distal.  As we went through the skin, we encountered serous fluid, no signs of inflammation.  The fascia of the tensor fascia lata repair was incised.  The tensor fascia was  swept laterally.  We encountered seroma inside the hip joint without signs of inflammation.  This was evacuated.  Following the initial exposure techniques, the hip was dislocated.  I was able to remove the femoral head.  The femoral component was  stable.  I then kept the femur at 100 degrees and applied gentle pressure cephalad and locked this in place.  I then placed retractors around the acetabulum.  Upon inspection of the acetabulum, particularly removed the previously placed polyethylene  liner, identified that the cup was obviously loose.  The old acetabular liner was  removed.  I was then able to remove the cancellous screw that was placed into the ilium.  The cup was then removed.  At this point, based on the size of a 52 Pinnacle  shell, I reamed to the 53 and then to a 55 reamer.  We selected a 56 Gription shell to try to enhance  fixation capabilities.  The bony bed preparation appeared to be very healthy without evidence of any discontinuity or other injuries to the anterior and  posterior columns.  With the cup in position under fluoroscopic guidance, I then placed 2 cancellous screws into the ilium with good fixation.  We then placed a 36+4 neutral AltrX liner.  I then trialed and went up from the 8.5 ball that we had used  previously to a 12 ball.  With this, the leg lengths and offset appeared to be comparable to her contralateral lower extremity with slightly bit of Shuck, but nothing that was concerning for instability.  Given this, the trial was dislocated.  The final  36+12 delta ceramic ball was then impacted on the clean and dried trunnion and the hip was reduced.  Throughout the case, we irrigated the hip with normal saline solution but also with Prontosan antimicrobial fluid.  Once this was done, I placed some  vancomycin powder deep into the wound and then the remaining portion in the subcutaneous layer.  The fascia of the tensor fascia lata was reapproximated again using #1 Vicryl and #1 Stratafix suture.  The remainder of the wound was closed with 2-0 Vicryl  and a running Monocryl stitch.  The wound was clean, dry and dressed sterilely using surgical glue and Aquacel dressing.  The patient was then brought to the recovery room in stable condition, tolerating the procedure well.  Postoperatively, based on the instance that occurred, we most likely will have her be partial weightbearing.  We will assess with physical therapy her mobility and hopefully get her back to home discharge as she had before.  This will be worked out  during the hospital.  She will resume DVT prophylaxis.  I will likely place her on 7 to 10 days of oral antibiotics postoperatively related to the surgeries at this point.   SHW D: 03/20/2023 5:41:06 pm T: 03/20/2023 7:00:00 pm  JOB: [id number]/ [id number]

## 2023-03-20 NOTE — Anesthesia Procedure Notes (Signed)
Procedure Name: Intubation Date/Time: 03/20/2023 4:17 PM  Performed by: Nelle Don, CRNAPre-anesthesia Checklist: Patient identified, Emergency Drugs available, Suction available and Patient being monitored Patient Re-evaluated:Patient Re-evaluated prior to induction Oxygen Delivery Method: Circle system utilized Preoxygenation: Pre-oxygenation with 100% oxygen Induction Type: IV induction Ventilation: Mask ventilation without difficulty Laryngoscope Size: Glidescope and 4 Grade View: Grade I Tube type: Oral Tube size: 7.0 mm Number of attempts: 1 Airway Equipment and Method: Stylet and Video-laryngoscopy Placement Confirmation: ETT inserted through vocal cords under direct vision, positive ETCO2 and breath sounds checked- equal and bilateral Secured at: 23 cm Tube secured with: Tape Dental Injury: Teeth and Oropharynx as per pre-operative assessment  Comments: Elective Glidescope used given history of grade 3 view.

## 2023-03-20 NOTE — Interval H&P Note (Signed)
History and Physical Interval Note:  03/20/2023 4:03 PM  Kendra [name]  has presented today for surgery, with the diagnosis of ACUTE ASEPTIC FAILURE OF ACETABULAR COMPONENT.  The various methods of treatment have been discussed with the patient and family. After consideration of risks, benefits and other options for treatment, the patient has consented to  Procedure(s): TOTAL HIP ARTHROPLASTY ANTERIOR APPROACH (Left) as a surgical intervention.  The patient's history has been reviewed, patient examined, no change in status, stable for surgery.  I have reviewed the patient's chart and labs.  Questions were answered to the patient's satisfaction.     [name]

## 2023-03-20 NOTE — Progress Notes (Signed)
PROGRESS NOTE    Kendra [name]  VHQ:[id number] DOB: 07-09-1946 DOA: 03/16/2023 PCP: Krystal Clark, NP  Outpatient Specialists:     Brief Narrative:  Patient is a 77 year old female transferred from [facility].  Past medical history significant for hypothyroidism, depression, sleep apnea, morbid obesity, GERD.  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  Patient initially did well.  Patient presented with a sharp pain in her left hip that she had never had before.  The pain has persisted, with no improvement.  Ortho was contacted, the thought was this was normal postop pain and swelling.  Patient also has cellulitis.  Vascular Doppler ultrasound of lower extremities negative for DVT.At [facility] Doppler study done was negative for DVT.    03/18/2023: Patient seen.  Left lower extremity cellulitis is improving.  Will continue IV antibiotics for now.  Orthopedic input is appreciated.  Patient also reports migraine headache.  Patient takes Imitrex at home.  Will start patient on as needed Imitrex.  Orthopedic team is directing management of left hip collection and pain.  03/20/2023: Leg edema and left lower extremity cellulitis continue to improve.  Continue antibiotics.  Orthopedic team plans to take patient to the OR later today.    Assessment & Plan:   Principal Problem:   Left hip pain Active Problems:   Depression   Morbid obesity (HCC)   Sleep apnea   Benign essential hypertension   High risk medication use    Left hip pain //  Fluid collection on CT //  Cellulitis: -sp THR left hip. -DDx postop seroma, hematoma, abscess.  CT imaging from [facility] not available.  Repeat CT ordered for a.m. -Blood cultures x 2 ordered.  IV Rocephin and vancomycin [pharmacy dosing] -Pain meds as needed -PureWick ordered. -LLE Doppler done OSH negative DVT -Consult physician on-call for [facility] in a.m. 03/18/2023: Orthopedic input is appreciated.  Orthopedic  is directing care.  Cellulitis has improved.  Continue antibiotics. 03/20/2023: Continue antibiotics.  For surgery later today by orthopedic team.     Right elbow seroma: -Does not appear infected.  X-ray ordered.  To be discussed with Ortho, inpt vs outpt evaluation.   Hypothyroidism: -Synthroid resumed   Anxiety and depression: -Patient q. hours sleep Xanax resumed. -Fluoxetine resumed   Morbid obesity (HCC): -On Ozempic   OSA: -CPAP ordered, patient has own   Alcohol use: -Patient states she drinks half bottle nightly. -CIWA initiated.  No clear evidence of withdrawal.  Last drink on Sunday.   DVT prophylaxis: SCD. Code Status: Full code. Family Communication:  Disposition Plan: This will depend on hospital course   Consultants:  Orthopedic surgery  Procedures:  None  Antimicrobials:  IV vancomycin. IV Zosyn.   Subjective: Left lower extremity cellulitis and swelling improving.  Left hip pain  Objective: Vitals:   03/19/23 0450 03/19/23 1442 03/19/23 2234 03/20/23 0601  BP: 137/77 138/84 138/73 (!) 143/77  Pulse: (!) 59 74 (!) 58 70  Resp: 17 16 18 18   Temp: 98.2 F (36.8 C) 98.3 F (36.8 C) 97.9 F (36.6 C) 98 F (36.7 C)  TempSrc: Oral Oral  Oral  SpO2: 93% 99% 94% 94%  Weight:      Height:        Intake/Output Summary (Last 24 hours) at 03/20/2023 1246 Last data filed at 03/20/2023 0600 Gross per 24 hour  Intake 1154.5 ml  Output 725 ml  Net 429.5 ml   Filed Weights   03/16/23 2300  Weight:  113.4 kg    Examination:  General exam: Appears calm and comfortable.  Patient is obese. Respiratory system: Clear to auscultation. Cardiovascular system: S1 & S2 heard. Gastrointestinal system: Abdomen is obese, soft and nontender.   Central nervous system: Awake and alert.  Patient moves all extremities.   Extremities: Left lower extremity cellulitis and swelling improving.   Data Reviewed: I have personally reviewed following labs and  imaging studies  CBC: Recent Labs  Lab 03/17/23 0106 03/17/23 0359 03/18/23 0336 03/19/23 0333 03/20/23 0357  WBC 9.7 9.3 10.3 7.8 7.2  NEUTROABS 7.1 6.8  --   --   --   HGB 9.7* 8.8* 9.5* 9.0* 9.2*  HCT 30.8* 28.0* 30.0* 28.8* 29.8*  MCV 97.5 97.2 96.8 97.6 97.7  PLT 266 269 277 311 353   Basic Metabolic Panel: Recent Labs  Lab 03/17/23 0106 03/17/23 0359 03/18/23 0336 03/19/23 0333 03/20/23 0357  NA 134* 132* 134* 138 134*  K 3.5 3.5 3.6 3.5 3.7  CL 99 98 100 102 100  CO2 26 26 27 29 26   GLUCOSE 117* 109* 142* 107* 166*  BUN 9 8 10 12 13   CREATININE 0.44 0.42* 0.59 0.49 0.48  CALCIUM 8.6* 8.2* 8.5* 8.5* 8.3*  MG 2.1  --  2.1 2.3 2.3  PHOS  --   --  4.4 3.0 3.2   GFR: Estimated Creatinine Clearance: 80.3 mL/min (by C-G formula based on SCr of 0.48 mg/dL). Liver Function Tests: Recent Labs  Lab 03/17/23 0106  AST 12*  ALT 11  ALKPHOS 80  BILITOT 1.2  PROT 6.0*  ALBUMIN 3.2*   No results for input(s): "LIPASE", "AMYLASE" in the last 168 hours. No results for input(s): "AMMONIA" in the last 168 hours. Coagulation Profile: Recent Labs  Lab 03/17/23 0359  INR 1.3*   Cardiac Enzymes: No results for input(s): "CKTOTAL", "CKMB", "CKMBINDEX", "TROPONINI" in the last 168 hours. BNP (last 3 results) No results for input(s): "PROBNP" in the last 8760 hours. HbA1C: No results for input(s): "HGBA1C" in the last 72 hours.  CBG: Recent Labs  Lab 03/17/23 1232 03/17/23 1632 03/17/23 2132 03/18/23 0721  GLUCAP 95 110* 185* 129*   Lipid Profile: No results for input(s): "CHOL", "HDL", "LDLCALC", "TRIG", "CHOLHDL", "LDLDIRECT" in the last 72 hours. Thyroid Function Tests: No results for input(s): "TSH", "T4TOTAL", "FREET4", "T3FREE", "THYROIDAB" in the last 72 hours. Anemia Panel: No results for input(s): "VITAMINB12", "FOLATE", "FERRITIN", "TIBC", "IRON", "RETICCTPCT" in the last 72 hours.  Urine analysis:    Component Value Date/Time   COLORURINE  STRAW (A) 12/08/2020 1108   APPEARANCEUR CLEAR 12/08/2020 1108   LABSPEC 1.006 12/08/2020 1108   PHURINE 7.0 12/08/2020 1108   GLUCOSEU NEGATIVE 12/08/2020 1108   HGBUR NEGATIVE 12/08/2020 1108   BILIRUBINUR NEGATIVE 12/08/2020 1108   KETONESUR NEGATIVE 12/08/2020 1108   PROTEINUR NEGATIVE 12/08/2020 1108   NITRITE NEGATIVE 12/08/2020 1108   LEUKOCYTESUR NEGATIVE 12/08/2020 1108   Sepsis Labs: @LABRCNTIP (procalcitonin:4,lacticidven:4)  ) Recent Results (from the past 240 hour(s))  Culture, blood (Routine X 2) w Reflex to ID Panel     Status: None (Preliminary result)   Collection Time: 03/17/23  1:06 AM   Specimen: BLOOD  Result Value Ref Range Status   Specimen Description   Final    BLOOD BLOOD RIGHT ARM Performed at [facility], [address] [place], [place] [address]    Special Requests   Final    BOTTLES DRAWN AEROBIC AND ANAEROBIC Blood Culture adequate volume  Performed at [facility], [address] [place], [place] [address]    Culture   Final    NO GROWTH 3 DAYS Performed at [facility], [address] [place], [place] [address]    Report Status PENDING  Incomplete  Culture, blood (Routine X 2) w Reflex to ID Panel     Status: None (Preliminary result)   Collection Time: 03/17/23  1:07 AM   Specimen: BLOOD  Result Value Ref Range Status   Specimen Description   Final    BLOOD BLOOD RIGHT HAND Performed at [facility], [address], [place], [place] [address]    Special Requests   Final    BOTTLES DRAWN AEROBIC AND ANAEROBIC Blood Culture adequate volume Performed at [facility], [address], [place], [place] [address]    Culture   Final    NO GROWTH 3 DAYS Performed at [facility], [address], [place], [place] [address]    Report Status PENDING  Incomplete         Radiology Studies: No results found.      Scheduled Meds:  ascorbic acid   500 mg Oral Daily   bisacodyl  10 mg Oral QHS   FLUoxetine  20 mg Oral Daily   folic acid  1 mg Oral Daily   furosemide  20 mg Intravenous Daily   iron polysaccharides  150 mg Oral Daily   latanoprost  1 drop Both Eyes QHS   levothyroxine  88 mcg Oral Q0600   multivitamin with minerals  1 tablet Oral Daily   pantoprazole  40 mg Oral Daily   polyethylene glycol  17 g Oral BID   povidone-iodine  2 Application Topical Once   povidone-iodine  2 Application Topical Once   pramipexole  2 mg Oral TID   thiamine  100 mg Oral Daily   Or   thiamine  100 mg Intravenous Daily   Continuous Infusions:  sodium chloride     sodium chloride 50 mL/hr at 03/20/23 0835   0.9 % NaCl with KCl 20 mEq / L 20 mL/hr at 03/19/23 0220   levofloxacin (LEVAQUIN) IV     piperacillin-tazobactam (ZOSYN)  IV 3.375 g (03/20/23 1046)   tranexamic acid     tranexamic acid     vancomycin 1,500 mg (03/19/23 2055)     LOS: 4 days    Time spent: 35 minutes.    Berton Mount, MD  [facility] Pager #: [phone] 7PM-7AM contact night coverage as above

## 2023-03-20 NOTE — Brief Op Note (Signed)
03/16/2023 - 03/20/2023  5:33 PM  PATIENT:  Kendra [name]  77 y.o. female  PRE-OPERATIVE DIAGNOSIS:  ACUTE ASEPTIC FAILURE OF ACETABULAR COMPONENT, left hip  POST-OPERATIVE DIAGNOSIS:  ACUTE ASEPTIC FAILURE OF ACETABULAR COMPONENT, left hip  PROCEDURE:  Procedure(s): Revision left TOTAL HIP ARTHROPLASTY ANTERIOR APPROACH (Left)  SURGEON:  Surgeons and Role:    Durene Romans, MD - Primary  PHYSICIAN ASSISTANT: Rosalene Billings, PA-C  ANESTHESIA:   general  EBL:  600 mL   BLOOD ADMINISTERED:none  DRAINS: none   LOCAL MEDICATIONS USED:  MARCAINE, 1 gm topical Vancomycin powder     SPECIMEN:  No Specimen  DISPOSITION OF SPECIMEN:  N/A  COUNTS:  YES  TOURNIQUET:  * No tourniquets in log *  DICTATION: .Other Dictation: Dictation Number [id number]  PLAN OF CARE: Admit to inpatient   PATIENT DISPOSITION:  PACU - hemodynamically stable.   Delay start of Pharmacological VTE agent (>24hrs) due to surgical blood loss or risk of bleeding: no

## 2023-03-20 NOTE — Progress Notes (Signed)
Patient ID: Kendra [name], female   DOB: May 20, 1946, 77 y.o.   MRN: [id number]  Plans for revision left hip today Unfortunately due to emergencies we are having to delay surgery to later this afternoon  Frustration understood  Continue NPO but add IV NS at 50 cc  Otherwise no events overnight

## 2023-03-20 NOTE — H&P (View-Only) (Signed)
Patient ID: Kendra [name], female   DOB: May 20, 1946, 77 y.o.   MRN: [id number]  Plans for revision left hip today Unfortunately due to emergencies we are having to delay surgery to later this afternoon  Frustration understood  Continue NPO but add IV NS at 50 cc  Otherwise no events overnight

## 2023-03-21 ENCOUNTER — Encounter (HOSPITAL_COMMUNITY): Payer: Self-pay | Admitting: Orthopedic Surgery

## 2023-03-21 DIAGNOSIS — L03116 Cellulitis of left lower limb: Secondary | ICD-10-CM | POA: Diagnosis not present

## 2023-03-21 DIAGNOSIS — M25552 Pain in left hip: Secondary | ICD-10-CM | POA: Diagnosis not present

## 2023-03-21 DIAGNOSIS — T8452XD Infection and inflammatory reaction due to internal left hip prosthesis, subsequent encounter: Secondary | ICD-10-CM | POA: Diagnosis not present

## 2023-03-21 DIAGNOSIS — Z6841 Body Mass Index (BMI) 40.0 and over, adult: Secondary | ICD-10-CM | POA: Diagnosis not present

## 2023-03-21 DIAGNOSIS — E6609 Other obesity due to excess calories: Secondary | ICD-10-CM | POA: Diagnosis not present

## 2023-03-21 DIAGNOSIS — K219 Gastro-esophageal reflux disease without esophagitis: Secondary | ICD-10-CM | POA: Diagnosis not present

## 2023-03-21 DIAGNOSIS — R2689 Other abnormalities of gait and mobility: Secondary | ICD-10-CM | POA: Diagnosis not present

## 2023-03-21 DIAGNOSIS — F419 Anxiety disorder, unspecified: Secondary | ICD-10-CM | POA: Diagnosis not present

## 2023-03-21 DIAGNOSIS — I10 Essential (primary) hypertension: Secondary | ICD-10-CM | POA: Diagnosis not present

## 2023-03-21 DIAGNOSIS — G4733 Obstructive sleep apnea (adult) (pediatric): Secondary | ICD-10-CM | POA: Diagnosis not present

## 2023-03-21 DIAGNOSIS — K5901 Slow transit constipation: Secondary | ICD-10-CM | POA: Diagnosis not present

## 2023-03-21 DIAGNOSIS — F329 Major depressive disorder, single episode, unspecified: Secondary | ICD-10-CM | POA: Diagnosis not present

## 2023-03-21 DIAGNOSIS — M810 Age-related osteoporosis without current pathological fracture: Secondary | ICD-10-CM | POA: Diagnosis not present

## 2023-03-21 DIAGNOSIS — E039 Hypothyroidism, unspecified: Secondary | ICD-10-CM | POA: Diagnosis not present

## 2023-03-21 DIAGNOSIS — T84091D Other mechanical complication of internal left hip prosthesis, subsequent encounter: Secondary | ICD-10-CM | POA: Diagnosis not present

## 2023-03-21 DIAGNOSIS — M7021 Olecranon bursitis, right elbow: Secondary | ICD-10-CM | POA: Diagnosis not present

## 2023-03-21 DIAGNOSIS — M1612 Unilateral primary osteoarthritis, left hip: Secondary | ICD-10-CM | POA: Diagnosis not present

## 2023-03-21 DIAGNOSIS — R2681 Unsteadiness on feet: Secondary | ICD-10-CM | POA: Diagnosis not present

## 2023-03-21 DIAGNOSIS — G4730 Sleep apnea, unspecified: Secondary | ICD-10-CM | POA: Diagnosis not present

## 2023-03-21 DIAGNOSIS — G43909 Migraine, unspecified, not intractable, without status migrainosus: Secondary | ICD-10-CM | POA: Diagnosis not present

## 2023-03-21 DIAGNOSIS — Z96642 Presence of left artificial hip joint: Secondary | ICD-10-CM | POA: Diagnosis not present

## 2023-03-21 DIAGNOSIS — Z4731 Aftercare following explantation of shoulder joint prosthesis: Secondary | ICD-10-CM | POA: Diagnosis not present

## 2023-03-21 LAB — CBC
HCT: 27.4 % — ABNORMAL LOW (ref 36.0–46.0)
Hemoglobin: 8.3 g/dL — ABNORMAL LOW (ref 12.0–15.0)
MCH: 29.7 pg (ref 26.0–34.0)
MCHC: 30.3 g/dL (ref 30.0–36.0)
MCV: 98.2 fL (ref 80.0–100.0)
Platelets: 352 10*3/uL (ref 150–400)
RBC: 2.79 MIL/uL — ABNORMAL LOW (ref 3.87–5.11)
RDW: 14.9 % (ref 11.5–15.5)
WBC: 10.3 10*3/uL (ref 4.0–10.5)
nRBC: 0 % (ref 0.0–0.2)

## 2023-03-21 LAB — BASIC METABOLIC PANEL
Anion gap: 7 (ref 5–15)
BUN: 14 mg/dL (ref 8–23)
CO2: 26 mmol/L (ref 22–32)
Calcium: 8.1 mg/dL — ABNORMAL LOW (ref 8.9–10.3)
Chloride: 100 mmol/L (ref 98–111)
Creatinine, Ser: 0.49 mg/dL (ref 0.44–1.00)
GFR, Estimated: >60 mL/min (ref >60)
Glucose, Bld: 180 mg/dL — ABNORMAL HIGH (ref 70–99)
Potassium: 3.7 mmol/L (ref 3.5–5.1)
Sodium: 133 mmol/L — ABNORMAL LOW (ref 135–145)

## 2023-03-21 MED ORDER — ACETAMINOPHEN 325 MG PO TABS: 650.0000 mg | ORAL_TABLET | Freq: Four times a day (QID) | ORAL | Status: DC

## 2023-03-21 MED ORDER — ADULT MULTIVITAMIN W/MINERALS CH: 1.0000 | ORAL_TABLET | Freq: Every day | ORAL | 0 refills | Status: DC

## 2023-03-21 MED ORDER — DOXYCYCLINE HYCLATE 50 MG PO CAPS: 100.0000 mg | ORAL_CAPSULE | Freq: Two times a day (BID) | ORAL | 0 refills | Status: AC

## 2023-03-21 MED ORDER — OXYCODONE HCL 10 MG PO TABS: 10.0000 mg | ORAL_TABLET | ORAL | 0 refills | Status: DC | PRN

## 2023-03-21 MED ORDER — OXYCODONE HCL 5 MG PO TABS: 5.0000 mg | ORAL_TABLET | ORAL | 0 refills | Status: DC | PRN

## 2023-03-21 MED ORDER — DOCUSATE SODIUM 100 MG PO CAPS: 100.0000 mg | ORAL_CAPSULE | Freq: Two times a day (BID) | ORAL | 0 refills | Status: DC

## 2023-03-21 MED ORDER — PANTOPRAZOLE SODIUM 40 MG PO TBEC: 40.0000 mg | DELAYED_RELEASE_TABLET | Freq: Every day | ORAL | 0 refills | Status: DC

## 2023-03-21 MED ORDER — ASCORBIC ACID 500 MG PO TABS: 500.0000 mg | ORAL_TABLET | Freq: Every day | ORAL | 0 refills | Status: DC

## 2023-03-21 MED ORDER — OXYCODONE HCL 5 MG PO TABS: 5.0000 mg | ORAL_TABLET | ORAL | 0 refills | Status: AC | PRN

## 2023-03-21 MED ORDER — VITAMIN B-1 100 MG PO TABS: 100.0000 mg | ORAL_TABLET | Freq: Every day | ORAL | 0 refills | Status: DC

## 2023-03-21 MED ORDER — SENNOSIDES-DOCUSATE SODIUM 8.6-50 MG PO TABS: 1.0000 | ORAL_TABLET | Freq: Every evening | ORAL | 0 refills | Status: DC | PRN

## 2023-03-21 MED ORDER — FOLIC ACID 1 MG PO TABS: 1.0000 mg | ORAL_TABLET | Freq: Every day | ORAL | 0 refills | Status: DC

## 2023-03-21 MED ORDER — AMOXICILLIN-POT CLAVULANATE 875-125 MG PO TABS: 1.0000 | ORAL_TABLET | Freq: Two times a day (BID) | ORAL | 0 refills | Status: AC

## 2023-03-21 NOTE — Progress Notes (Signed)
PT TX NOTE- pm session  03/21/23 1400  PT Visit Information  Last PT Received On 03/21/23  Assistance Needed Charquita continues to progress well.  Had some incr pain after last session, controlled with meds; incr gait distance with intermittent cues for PWB and step to pattern. Reviewed THA HEP. Reviewed d/c process with pt and dtr. Anticipate steady progress in next venue of care.  History of Present Illness 77 year old female transferred from [facility].  Her past medical history includes hypothyroidism, depression, sleep apnea, morbid obesity, spinal stenosis, GERD.  On 03/01/2023 patient underwent left hip total replacement.  Per patient her leg has been swollen since but otherwise she did well until 03/17/23 when she tried to get up from a table, felt a sharp pain in her left hip that she had never had before. CT showed L hip fluid collection  seroma vs hematoma. Antibiotics for cellulitis started at [facility]. Imaging at [facility] showed possible shift in position of acetabular cup component. s/p L THA revision, AA d/t aseptic loosening acetabular component on 03/20/23  Subjective Data  Patient Stated Goal to walk  Precautions  Precautions Fall  Restrictions  Weight Bearing Restrictions Yes  LLE Weight Bearing PWB  LLE Partial Weight Bearing Percentage or Pounds 50%  Pain Assessment  Pain Assessment 0-10  Pain Score 3  Pain Location L hip with movement  Pain Descriptors / Indicators Sore  Pain Intervention(s) Limited activity within patient's tolerance;Monitored during session;Premedicated before session;Repositioned  Cognition  Arousal Alert  Behavior During Therapy WFL for tasks assessed/performed  Overall Cognitive Status Within Functional Limits for tasks assessed  Bed Mobility  Overal bed mobility Needs Assistance  Bed Mobility Supine to Sit  Supine to sit Contact guard;Min assist  General bed mobility comments able to use leg lifter to progress LLE off bed  Transfers   Overall transfer level Needs assistance  Equipment used Rolling walker (2 wheels)  Transfers Sit to/from Stand  Sit to Stand Contact guard assist;Supervision  General transfer comment for safety, cues for LLE position; nophysicql assist  Ambulation/Gait  Ambulation/Gait assistance Supervision  Gait Distance (Feet) 84 Feet  Assistive device Rolling walker (2 wheels)  Gait Pattern/deviations Step-to pattern;Decreased step length - left;Decreased step length - right  General Gait Details cues for sequence and PWB; good adherence to [facility]  Gait velocity decr  Total Joint Exercises  Ankle Circles/Pumps AROM;Both;10 reps  Quad Sets AROM;Both;10 reps  Heel Slides AAROM;Left;10 reps  Hip ABduction/ADduction AAROM;Left;10 reps  PT - End of Session  Equipment Utilized During Treatment Gait belt  Activity Tolerance Patient tolerated treatment well  Patient left in chair;with call bell/phone within reach;with chair alarm set;with family/visitor present  Nurse Communication Mobility status   PT - Assessment/Plan  PT Visit Diagnosis Difficulty in walking, not elsewhere classified (R26.2);Pain;Other abnormalities of gait and mobility (R26.89)  Pain - Right/Left Left  Pain - part of body Hip  PT Frequency (ACUTE ONLY) 7X/week  Follow Up Recommendations Skilled nursing-short term rehab (<3 hours/day)  Can patient physically be transported by private vehicle Yes  Patient can return home with the following A little help with walking and/or transfers;A little help with bathing/dressing/bathroom;Assistance with cooking/housework;Assist for transportation;Help with stairs or ramp for entrance  PT equipment None recommended by PT  AM-PAC PT "6 Clicks" Mobility Outcome Measure (Version 2)  Help needed turning from your back to your side while in a flat bed without using bedrails? 3  Help needed moving from lying on your back to  sitting on the side of a flat bed without using bedrails? 3  Help needed moving  to and from a bed to a chair (including a wheelchair)? 3  Help needed standing up from a chair using your arms (e.g., wheelchair or bedside chair)? 3  Help needed to walk in hospital room? 3  Help needed climbing 3-5 steps with a railing?  2  6 Click Score 17  Consider Recommendation of Discharge To: Home with [facility]  Progressive Mobility  What is the highest level of mobility based on the progressive mobility assessment? Level 5 (Walks with assist in room/hall) - Balance while stepping forward/back and can walk in room with assist - Complete  PT Goal Progression  Progress towards PT goals Progressing toward goals  Acute Rehab PT Goals  PT Goal Formulation With patient  Time For Goal Achievement 04/01/23  Potential to Achieve Goals Good  PT Time Calculation  PT Start Time (ACUTE ONLY) 1430  PT Stop Time (ACUTE ONLY) 1456  PT Time Calculation (min) (ACUTE ONLY) 26 min  PT General Charges  $$ ACUTE PT VISIT 1 Visit  PT Treatments  $Gait Training 8-22 mins  $Therapeutic Exercise 8-22 mins

## 2023-03-21 NOTE — Discharge Summary (Addendum)
Physician Discharge Summary  Patient ID: Kendra [name] MRN: [id number] DOB/AGE: 1945-11-27 77 y.o.  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  Active Problems:   Depression   Morbid obesity (HCC)   Sleep apnea   Benign essential hypertension   High risk medication use   Left lower extremity cellulitis   Discharged Condition: stable  Hospital Course:  Patient is a 77 year old female with past medical history significant for hypothyroidism, depression, sleep apnea, morbid obesity, GERD.  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  Patient initially did well.  Patient presented with a sharp pain in her left hip that she had never had before.  The pain had persisted, with no improvement.  Patient was transferred from [facility] to [facility] for further assessment and management.  Orthopedic team was was consulted to assist with patient's evaluation and management.  Orthopedic team determined that the left hip pain was secondary to acute aseptic failure of left acetabular component from total hip arthroplasty.  Patient underwent revision left total hip arthroplasty for acute aseptic failure of left acetabular component from total hip arthroplasty on 03/20/2023.  Orthopedic team has cleared patient for discharge.  Patient also has cellulitis of the left lower extremity that was treated with IV vancomycin and Zosyn.  Cellulitis has improved significantly.  Patient will be discharged on oral Augmentin and doxycycline for 4 days. Vascular Doppler ultrasound of lower extremities done at [facility] was negative for DVT.     Left hip pain //  Fluid collection on CT //  Cellulitis: -S/p THR left hip. - CT imaging from [facility] not available.   -Repeat CT ordered. -Blood cultures x 2  ordered.   -Patient was started on antibiotics -Pain meds as needed -PureWick ordered. -LLE Doppler done OSH negative DVT -The PT team was consulted to assist with evaluation and management. 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 -Cellulitis was treated with IV vancomycin and Zosyn. -Patient be discharged on oral Augmentin and doxycycline for the next 4 days. -Orthopedic team has cleared patient for discharge.   Right elbow seroma: -Does not appear infected.  X-ray ordered.      Hypothyroidism: -Synthroid resumed   Anxiety and depression: -Patient q. hours sleep Xanax resumed. -Fluoxetine resumed   Morbid obesity (HCC): -On Ozempic   OSA: -CPAP ordered, patient has own   Alcohol use: -Patient states she drinks half bottle nightly. -CIWA initiated.  No clear evidence of withdrawal.  .   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  -IV vancomycin and Zosyn for left lower extremity cellulitis.  Patient will be discharged back on oral Augmentin and doxycycline for 4 days.  Discharge Exam: Blood pressure 132/70, pulse 68, temperature 98 F (36.7 C), resp. rate 17, height 5\' 8"  (1.727 m), weight 113.4 kg, SpO2 93%.   Disposition: Skilled nursing facility for short-term rehab Discharge Instructions     Diet - low sodium heart healthy   Complete by: As directed    Increase activity slowly   Complete by: As directed    Increase activity slowly   Complete by: As directed  Allergies as of 03/21/2023       Reactions   Bee Venom Anaphylaxis   Feldene [piroxicam]    Flexeril [cyclobenzaprine] Nausea Only   Cephalosporins Rash   Keflex [cephalexin] Rash        Medication List     TAKE these medications     acetaminophen 325 MG tablet Commonly known as: TYLENOL Take 2 tablets (650 mg total) by mouth every 6 (six) hours.   ALPRAZolam 0.5 MG tablet Commonly known as: XANAX Take 0.5 mg by mouth at bedtime as needed for anxiety or sleep.   amoxicillin-clavulanate 875-125 MG tablet Commonly known as: AUGMENTIN Take 1 tablet by mouth 2 (two) times daily for 4 days.   ascorbic acid 500 MG tablet Commonly known as: VITAMIN C Take 1 tablet (500 mg total) by mouth daily. Start taking on: March 22, 2023   aspirin 81 MG chewable tablet Chew 1 tablet (81 mg total) by mouth 2 (two) times daily for 28 days.   cholecalciferol 25 MCG (1000 UNIT) tablet Commonly known as: VITAMIN D3 Take 1,000 Units by mouth daily.   docusate sodium 100 MG capsule Commonly known as: COLACE Take 1 capsule (100 mg total) by mouth 2 (two) times daily.   doxycycline 50 MG capsule Commonly known as: VIBRAMYCIN Take 2 capsules (100 mg total) by mouth 2 (two) times daily for 4 days.   EPINEPHrine 0.3 mg/0.3 mL Soaj injection Commonly known as: EPI-PEN Inject 0.3 mg into the muscle as needed for anaphylaxis.   ferrous sulfate 325 (65 FE) MG EC tablet Take 325 mg by mouth daily.   FLUoxetine 20 MG capsule Commonly known as: PROZAC Take 20 mg by mouth daily.   folic acid 1 MG tablet Commonly known as: FOLVITE Take 1 tablet (1 mg total) by mouth daily. Start taking on: March 22, 2023   furosemide 40 MG tablet Commonly known as: LASIX Take 40 mg by mouth daily as needed for edema.   latanoprost 0.005 % ophthalmic solution Commonly known as: XALATAN Place 1 drop into both eyes at bedtime.   levothyroxine 88 MCG tablet Commonly known as: SYNTHROID Take 88 mcg by mouth daily before breakfast.   multivitamin with minerals Tabs tablet Take 1 tablet by mouth daily. Start taking on: March 22, 2023   omeprazole 20 MG capsule Commonly known as: PRILOSEC Take 20 mg by mouth daily.   oxyCODONE 5 MG  immediate release tablet Commonly known as: Oxy IR/ROXICODONE Take 1-2 tablets (5-10 mg total) by mouth every 4 (four) hours as needed for moderate pain (pain score 4-6). What changed: reasons to take this   Oxycodone HCl 10 MG Tabs Take 1-1.5 tablets (10-15 mg total) by mouth every 4 (four) hours as needed (pain score 7-10). What changed: You were already taking a medication with the same name, and this prescription was added. Make sure you understand how and when to take each.   Ozempic (2 MG/DOSE) 8 MG/3ML Sopn Generic drug: Semaglutide (2 MG/DOSE) Inject 2 mg into the skin once a week.   pantoprazole 40 MG tablet Commonly known as: PROTONIX Take 1 tablet (40 mg total) by mouth daily. Start taking on: March 22, 2023   polyethylene glycol 17 g packet Commonly known as: MIRALAX / GLYCOLAX Take 17 g by mouth 2 (two) times daily.   pramipexole 1 MG tablet Commonly known as: MIRAPEX Take 2 mg by mouth 3 (three) times daily.   senna-docusate 8.6-50 MG tablet Commonly known as: Senokot-S Take 1  tablet by mouth at bedtime as needed for mild constipation.   SUMAtriptan 100 MG tablet Commonly known as: IMITREX Take 100 mg by mouth every 2 (two) hours as needed for migraine. May repeat in 2 hours if headache persists or recurs.   thiamine 100 MG tablet Commonly known as: Vitamin B-1 Take 1 tablet (100 mg total) by mouth daily. Start taking on: March 22, 2023   tiZANidine 2 MG tablet Commonly known as: ZANAFLEX Take 1 tablet (2 mg total) by mouth every 8 (eight) hours as needed for muscle spasms (muscle pain).        Contact information for after-discharge care     Destination     [facility] Preferred SNF .   Service: Skilled Paramedic information: [address] [place] [address] [phone]                    Time spent: 35 minutes.  SignedBarnetta Chapel 03/21/2023, 3:05 PM

## 2023-03-21 NOTE — TOC Transition Note (Addendum)
Transition of Care [facility]) - CM/SW Discharge Note   Patient Details  Name: Kendra [name] MRN: [id number] Date of Birth: 08/23/1945  Transition of Care [facility]) CM/SW Contact:  Darleene Cleaver, LCSW Phone Number: 03/21/2023, 3:57 PM   Clinical Narrative:     CSW was informed that patient is ready for discharge today.  Patient chose [facility], Patient to be d/c'ed today to [facility].  Patient and family agreeable to plans will transport via ems RN to call report to room 711 [phone]. [phone].  CSW notified patient's sister Erskine Squibb who is aware that patient is discharging today.  CSW signing off please reconsult when other social work needs arise.  EMS was notified at 3:10pm and CSW was informed that there are 3 patients ahead of her.    Final next level of care: Skilled Nursing Facility Barriers to Discharge: Barriers Resolved   Patient Goals and CMS Choice CMS Medicare.gov Compare Post Acute Care list provided to:: Patient Represenative (must comment) Choice offered to / list presented to : Sibling  Discharge Placement PASRR number recieved: 03/18/23 PASRR number recieved: 03/18/23            Patient chooses bed at: [facility] [place] Patient to be transferred to facility by: [facility] Name of family member notified: Sister Erskine Squibb Patient and family notified of of transfer: 03/21/23  Discharge Plan and Services Additional resources added to the After Visit Summary for                  DME Arranged: N/A DME Agency: NA                  Social Determinants of Health (SDOH) Interventions SDOH Screenings   Food Insecurity: No Food Insecurity (03/17/2023)  Housing: Low Risk  (03/17/2023)  Transportation Needs: No Transportation Needs (03/17/2023)  Utilities: Not At Risk (03/17/2023)  Financial Resource Strain: Low Risk  (12/22/2020)   Received from [facility] visits prior to 09/25/2022., [facility] visits prior to  09/25/2022.  Physical Activity: Unknown (12/22/2020)   Received from [facility] visits prior to 09/25/2022., [facility] visits prior to 09/25/2022.  Social Connections: Moderately Integrated (12/22/2020)   Received from [facility] visits prior to 09/25/2022., [facility] visits prior to 09/25/2022.  Stress: Stress Concern Present (12/22/2020)   Received from [facility] visits prior to 09/25/2022., [facility] visits prior to 09/25/2022.  Tobacco Use: Medium Risk (03/17/2023)     Readmission Risk Interventions     No data to display

## 2023-03-21 NOTE — Progress Notes (Signed)
Patient ID: Kendra [name], female   DOB: 03-27-1946, 77 y.o.   MRN: [id number] Subjective: 1 Day Post-Op Procedure(s) (LRB): TOTAL HIP ARTHROPLASTY ANTERIOR APPROACH (Left)    Patient reports pain as mild to moderate but hasn't tried to move much yet. Looks good overall States that most of the other joint pains have resolved at this point  Objective:   VITALS:   Vitals:   03/20/23 2249 03/21/23 0543  BP: (!) 143/82 121/74  Pulse: 65 62  Resp: 14 17  Temp: 98.2 F (36.8 C) 98.1 F (36.7 C)  SpO2: 92% 95%    Neurovascular intact Incision: dressing C/D/I  LABS Recent Labs    03/19/23 0333 03/20/23 0357 03/21/23 0400  HGB 9.0* 9.2* 8.3*  HCT 28.8* 29.8* 27.4*  WBC 7.8 7.2 10.3  PLT 311 353 352    Recent Labs    03/19/23 0333 03/20/23 0357 03/21/23 0400  NA 138 134* 133*  K 3.5 3.7 3.7  BUN 12 13 14   CREATININE 0.49 0.48 0.49  GLUCOSE 107* 166* 180*    No results for input(s): "LABPT", "INR" in the last 72 hours.   Assessment/Plan: 1 Day Post-Op Procedure(s) (LRB): TOTAL HIP ARTHROPLASTY ANTERIOR APPROACH (Left)   Advance diet Up with therapy Due to very limited help at home she will likely require ST SNF stay They have connections at a [facility] facility and are hopeful to go there Likely plan for discharge tomorrow  PT - PWB LLE for now ASA for DVT prophylaxis Will keep her on ABx post op for at least 10 days for prophylaxis given the short interval between surgeries, likely Doxycycline given reported response to cephalosporins unless we want to test her with dose of Cefadroxil while she is here

## 2023-03-21 NOTE — Plan of Care (Signed)
  Problem: Education: Goal: Knowledge of General Education information will improve Description: Including pain rating scale, medication(s)/side effects and non-pharmacologic comfort measures Outcome: Progressing   Problem: Health Behavior/Discharge Planning: Goal: Ability to manage health-related needs will improve Outcome: Progressing   Problem: Clinical Measurements: Goal: Ability to maintain clinical measurements within normal limits will improve Outcome: Progressing Goal: Will remain free from infection Outcome: Progressing Goal: Diagnostic test results will improve Outcome: Progressing Goal: Respiratory complications will improve Outcome: Progressing Goal: Cardiovascular complication will be avoided Outcome: Progressing   Problem: Activity: Goal: Risk for activity intolerance will decrease Outcome: Progressing   Problem: Coping: Goal: Level of anxiety will decrease Outcome: Progressing   Problem: Elimination: Goal: Will not experience complications related to bowel motility Outcome: Progressing   Problem: Pain Managment: Goal: General experience of comfort will improve Outcome: Progressing   Problem: Safety: Goal: Ability to remain free from injury will improve Outcome: Progressing   Problem: Education: Goal: Knowledge of the prescribed therapeutic regimen will improve Outcome: Progressing Goal: Understanding of discharge needs will improve Outcome: Progressing Goal: Individualized Educational Video(s) Outcome: Completed/Met   Problem: Activity: Goal: Ability to avoid complications of mobility impairment will improve Outcome: Progressing Goal: Ability to tolerate increased activity will improve Outcome: Progressing   Problem: Clinical Measurements: Goal: Postoperative complications will be avoided or minimized Outcome: Progressing   Problem: Pain Management: Goal: Pain level will decrease with appropriate interventions Outcome: Progressing    Problem: Education: Goal: Knowledge of the prescribed therapeutic regimen will improve Outcome: Progressing Goal: Understanding of discharge needs will improve Outcome: Progressing Goal: Individualized Educational Video(s) Outcome: Completed/Met   Problem: Pain Management: Goal: Pain level will decrease with appropriate interventions Outcome: Progressing   Problem: Skin Integrity: Goal: Will show signs of wound healing Outcome: Progressing

## 2023-03-21 NOTE — Evaluation (Addendum)
Physical Therapy Re-Evaluation Patient Details Name: Kendra [name] MRN: [id number] DOB: Oct 07, 1945 Today's Date: 03/21/2023  History of Present Illness  77 year old female transferred from [facility].  Her past medical history includes hypothyroidism, depression, sleep apnea, morbid obesity, spinal stenosis, GERD.  On 03/01/2023 patient underwent left hip total replacement.  Per patient her leg has been swollen since but otherwise she did well until 03/17/23 when she tried to get up from a table, felt a sharp pain in her left hip that she had never had before. CT showed L hip fluid collection  seroma vs hematoma. Antibiotics for cellulitis started at [facility]. Imaging at [facility] showed possible shift in position of acetabular cup component. s/p L THA revision, AA d/t aseptic loosening acetabular component on 03/20/23  Clinical Impression  Pt admitted with above diagnosis.  Pt is motivated and agreeable to PT, feels she will need continued rehab post acute. Patient will benefit from continued inpatient follow up therapy, <3 hours/day Amb ~ 69' with RW and CGA, cues needed for adherence to [facility]. Will continue to follow in acute setting.  Pt currently with functional limitations due to the deficits listed below (see PT Problem List). Pt will benefit from acute skilled PT to increase their independence and safety with mobility to allow discharge.           If plan is discharge home, recommend the following: A little help with walking and/or transfers;A little help with bathing/dressing/bathroom;Assistance with cooking/housework;Assist for transportation;Help with stairs or ramp for entrance   Can travel by private vehicle   Yes    Equipment Recommendations None recommended by PT  Recommendations for Other Services       Functional Status Assessment       Precautions / Restrictions Precautions Precautions: Fall Restrictions Weight Bearing Restrictions: Yes LLE Weight Bearing:  Partial weight bearing LLE Partial Weight Bearing Percentage or Pounds: 50%      Mobility  Bed Mobility               General bed mobility comments: in recliner on arrival    Transfers Overall transfer level: Needs assistance Equipment used: Rolling walker (2 wheels) Transfers: Sit to/from Stand Sit to Stand: Contact guard assist           General transfer comment: for safety    Ambulation/Gait Ambulation/Gait assistance: Supervision Gait Distance (Feet): 80 Feet Assistive device: Rolling walker (2 wheels) Gait Pattern/deviations: Step-to pattern, Decreased step length - left, Decreased step length - right Gait velocity: decr     General Gait Details: cues for sequence and PWB  Stairs            Wheelchair Mobility     Tilt Bed    Modified Rankin (Stroke Patients Only)       Balance                                             Pertinent Vitals/Pain Pain Assessment Pain Assessment: 0-10 Pain Score: 3  Pain Location: L hip with movement Pain Descriptors / Indicators: Sore Pain Intervention(s): Limited activity within patient's tolerance, Premedicated before session, Monitored during session, Repositioned    Home Living                          Prior Function  Extremity/Trunk Assessment                Communication      Cognition Arousal: Alert Behavior During Therapy: WFL for tasks assessed/performed Overall Cognitive Status: Within Functional Limits for tasks assessed                                          General Comments      Exercises Total Joint Exercises Ankle Circles/Pumps: AROM, Both, 10 reps   Assessment/Plan    PT Assessment    PT Problem List         PT Treatment Interventions      PT Goals (Current goals can be found in the Care Plan section)  Acute Rehab PT Goals Patient Stated Goal: to walk PT Goal Formulation: With  patient Time For Goal Achievement: 04/01/23 Potential to Achieve Goals: Good    Frequency 7X/week     Co-evaluation               AM-PAC PT "6 Clicks" Mobility  Outcome Measure Help needed turning from your back to your side while in a flat bed without using bedrails?: A Little Help needed moving from lying on your back to sitting on the side of a flat bed without using bedrails?: A Little Help needed moving to and from a bed to a chair (including a wheelchair)?: A Little Help needed standing up from a chair using your arms (e.g., wheelchair or bedside chair)?: A Little Help needed to walk in hospital room?: A Little Help needed climbing 3-5 steps with a railing? : A Lot 6 Click Score: 17    End of Session Equipment Utilized During Treatment: Gait belt Activity Tolerance: Patient tolerated treatment well Patient left: in chair;with call bell/phone within reach;with chair alarm set;with nursing/sitter in room Nurse Communication: Mobility status PT Visit Diagnosis: Difficulty in walking, not elsewhere classified (R26.2);Pain;Other abnormalities of gait and mobility (R26.89) Pain - Right/Left: Left Pain - part of body: Hip    Time: 6301-6010 PT Time Calculation (min) (ACUTE ONLY): 15 min   Charges:   PT Evaluation $PT Re-evaluation: 1 Re-eval   PT General Charges $$ ACUTE PT VISIT: 1 Visit         Kendra [name], PT  Acute Rehab Dept [facility]) [phone]  03/21/2023   [facility] 03/21/2023, 10:07 AM

## 2023-03-21 NOTE — Care Management Important Message (Signed)
Important Message  Patient Details IM Letter given. Name: Kendra [name] MRN: [id number] Date of Birth: Dec 02, 1945   Medicare Important Message Given:  Yes     Kendra [name], Kendra [name] 03/21/2023, 2:21 PM

## 2023-03-23 LAB — CULTURE, BLOOD (ROUTINE X 2)
Culture: NO GROWTH
Culture: NO GROWTH
Special Requests: ADEQUATE
Special Requests: ADEQUATE

## 2023-03-24 ENCOUNTER — Encounter: Payer: Self-pay | Admitting: Specialist

## 2023-04-04 DIAGNOSIS — Z4731 Aftercare following explantation of shoulder joint prosthesis: Secondary | ICD-10-CM | POA: Diagnosis not present

## 2023-04-04 DIAGNOSIS — M1612 Unilateral primary osteoarthritis, left hip: Secondary | ICD-10-CM | POA: Diagnosis not present

## 2023-04-04 DIAGNOSIS — M7021 Olecranon bursitis, right elbow: Secondary | ICD-10-CM | POA: Diagnosis not present

## 2023-04-12 DIAGNOSIS — M170 Bilateral primary osteoarthritis of knee: Secondary | ICD-10-CM | POA: Diagnosis not present

## 2023-04-12 DIAGNOSIS — Z6837 Body mass index (BMI) 37.0-37.9, adult: Secondary | ICD-10-CM | POA: Diagnosis not present

## 2023-04-12 DIAGNOSIS — D649 Anemia, unspecified: Secondary | ICD-10-CM | POA: Diagnosis not present

## 2023-04-12 DIAGNOSIS — M5136 Other intervertebral disc degeneration, lumbar region: Secondary | ICD-10-CM | POA: Diagnosis not present

## 2023-04-12 DIAGNOSIS — K219 Gastro-esophageal reflux disease without esophagitis: Secondary | ICD-10-CM | POA: Diagnosis not present

## 2023-04-12 DIAGNOSIS — I38 Endocarditis, valve unspecified: Secondary | ICD-10-CM | POA: Diagnosis not present

## 2023-04-12 DIAGNOSIS — R5383 Other fatigue: Secondary | ICD-10-CM | POA: Diagnosis not present

## 2023-04-12 DIAGNOSIS — R7303 Prediabetes: Secondary | ICD-10-CM | POA: Diagnosis not present

## 2023-04-12 DIAGNOSIS — I10 Essential (primary) hypertension: Secondary | ICD-10-CM | POA: Diagnosis not present

## 2023-04-12 DIAGNOSIS — R600 Localized edema: Secondary | ICD-10-CM | POA: Diagnosis not present

## 2023-04-12 DIAGNOSIS — G2581 Restless legs syndrome: Secondary | ICD-10-CM | POA: Diagnosis not present

## 2023-04-12 DIAGNOSIS — R5381 Other malaise: Secondary | ICD-10-CM | POA: Diagnosis not present

## 2023-04-12 DIAGNOSIS — M48061 Spinal stenosis, lumbar region without neurogenic claudication: Secondary | ICD-10-CM | POA: Diagnosis not present

## 2023-04-12 DIAGNOSIS — E039 Hypothyroidism, unspecified: Secondary | ICD-10-CM | POA: Diagnosis not present

## 2023-04-13 DIAGNOSIS — I10 Essential (primary) hypertension: Secondary | ICD-10-CM | POA: Diagnosis not present

## 2023-04-13 DIAGNOSIS — L03116 Cellulitis of left lower limb: Secondary | ICD-10-CM | POA: Diagnosis not present

## 2023-04-13 DIAGNOSIS — E039 Hypothyroidism, unspecified: Secondary | ICD-10-CM | POA: Diagnosis not present

## 2023-04-13 DIAGNOSIS — Z9181 History of falling: Secondary | ICD-10-CM | POA: Diagnosis not present

## 2023-04-13 DIAGNOSIS — T84091D Other mechanical complication of internal left hip prosthesis, subsequent encounter: Secondary | ICD-10-CM | POA: Diagnosis not present

## 2023-04-13 DIAGNOSIS — M1990 Unspecified osteoarthritis, unspecified site: Secondary | ICD-10-CM | POA: Diagnosis not present

## 2023-04-13 DIAGNOSIS — K5901 Slow transit constipation: Secondary | ICD-10-CM | POA: Diagnosis not present

## 2023-04-13 DIAGNOSIS — G8918 Other acute postprocedural pain: Secondary | ICD-10-CM | POA: Diagnosis not present

## 2023-04-13 DIAGNOSIS — Z87891 Personal history of nicotine dependence: Secondary | ICD-10-CM | POA: Diagnosis not present

## 2023-04-13 DIAGNOSIS — G4733 Obstructive sleep apnea (adult) (pediatric): Secondary | ICD-10-CM | POA: Diagnosis not present

## 2023-04-13 DIAGNOSIS — F419 Anxiety disorder, unspecified: Secondary | ICD-10-CM | POA: Diagnosis not present

## 2023-04-13 DIAGNOSIS — F39 Unspecified mood [affective] disorder: Secondary | ICD-10-CM | POA: Diagnosis not present

## 2023-04-13 DIAGNOSIS — M810 Age-related osteoporosis without current pathological fracture: Secondary | ICD-10-CM | POA: Diagnosis not present

## 2023-04-13 DIAGNOSIS — K219 Gastro-esophageal reflux disease without esophagitis: Secondary | ICD-10-CM | POA: Diagnosis not present

## 2023-04-13 DIAGNOSIS — G43909 Migraine, unspecified, not intractable, without status migrainosus: Secondary | ICD-10-CM | POA: Diagnosis not present

## 2023-04-13 DIAGNOSIS — F32A Depression, unspecified: Secondary | ICD-10-CM | POA: Diagnosis not present

## 2023-04-13 DIAGNOSIS — D649 Anemia, unspecified: Secondary | ICD-10-CM | POA: Diagnosis not present

## 2023-04-13 DIAGNOSIS — S5001XD Contusion of right elbow, subsequent encounter: Secondary | ICD-10-CM | POA: Diagnosis not present

## 2023-04-13 DIAGNOSIS — H409 Unspecified glaucoma: Secondary | ICD-10-CM | POA: Diagnosis not present

## 2023-04-13 DIAGNOSIS — Z7982 Long term (current) use of aspirin: Secondary | ICD-10-CM | POA: Diagnosis not present

## 2023-04-13 DIAGNOSIS — F451 Undifferentiated somatoform disorder: Secondary | ICD-10-CM | POA: Diagnosis not present

## 2023-04-13 DIAGNOSIS — Z7985 Long-term (current) use of injectable non-insulin antidiabetic drugs: Secondary | ICD-10-CM | POA: Diagnosis not present

## 2023-04-13 DIAGNOSIS — Z6841 Body Mass Index (BMI) 40.0 and over, adult: Secondary | ICD-10-CM | POA: Diagnosis not present

## 2023-04-15 DIAGNOSIS — T84091D Other mechanical complication of internal left hip prosthesis, subsequent encounter: Secondary | ICD-10-CM | POA: Diagnosis not present

## 2023-04-15 DIAGNOSIS — K5901 Slow transit constipation: Secondary | ICD-10-CM | POA: Diagnosis not present

## 2023-04-15 DIAGNOSIS — M810 Age-related osteoporosis without current pathological fracture: Secondary | ICD-10-CM | POA: Diagnosis not present

## 2023-04-15 DIAGNOSIS — G8918 Other acute postprocedural pain: Secondary | ICD-10-CM | POA: Diagnosis not present

## 2023-04-15 DIAGNOSIS — L03116 Cellulitis of left lower limb: Secondary | ICD-10-CM | POA: Diagnosis not present

## 2023-04-19 DIAGNOSIS — K5901 Slow transit constipation: Secondary | ICD-10-CM | POA: Diagnosis not present

## 2023-04-19 DIAGNOSIS — T84091D Other mechanical complication of internal left hip prosthesis, subsequent encounter: Secondary | ICD-10-CM | POA: Diagnosis not present

## 2023-04-19 DIAGNOSIS — M810 Age-related osteoporosis without current pathological fracture: Secondary | ICD-10-CM | POA: Diagnosis not present

## 2023-04-19 DIAGNOSIS — L03116 Cellulitis of left lower limb: Secondary | ICD-10-CM | POA: Diagnosis not present

## 2023-04-19 DIAGNOSIS — G8918 Other acute postprocedural pain: Secondary | ICD-10-CM | POA: Diagnosis not present

## 2023-04-20 DIAGNOSIS — K5901 Slow transit constipation: Secondary | ICD-10-CM | POA: Diagnosis not present

## 2023-04-20 DIAGNOSIS — M810 Age-related osteoporosis without current pathological fracture: Secondary | ICD-10-CM | POA: Diagnosis not present

## 2023-04-20 DIAGNOSIS — G8918 Other acute postprocedural pain: Secondary | ICD-10-CM | POA: Diagnosis not present

## 2023-04-20 DIAGNOSIS — L03116 Cellulitis of left lower limb: Secondary | ICD-10-CM | POA: Diagnosis not present

## 2023-04-20 DIAGNOSIS — T84091D Other mechanical complication of internal left hip prosthesis, subsequent encounter: Secondary | ICD-10-CM | POA: Diagnosis not present

## 2023-04-21 DIAGNOSIS — G8918 Other acute postprocedural pain: Secondary | ICD-10-CM | POA: Diagnosis not present

## 2023-04-21 DIAGNOSIS — M810 Age-related osteoporosis without current pathological fracture: Secondary | ICD-10-CM | POA: Diagnosis not present

## 2023-04-21 DIAGNOSIS — L03116 Cellulitis of left lower limb: Secondary | ICD-10-CM | POA: Diagnosis not present

## 2023-04-21 DIAGNOSIS — K5901 Slow transit constipation: Secondary | ICD-10-CM | POA: Diagnosis not present

## 2023-04-21 DIAGNOSIS — T84091D Other mechanical complication of internal left hip prosthesis, subsequent encounter: Secondary | ICD-10-CM | POA: Diagnosis not present

## 2023-04-25 DIAGNOSIS — M810 Age-related osteoporosis without current pathological fracture: Secondary | ICD-10-CM | POA: Diagnosis not present

## 2023-04-25 DIAGNOSIS — T84091D Other mechanical complication of internal left hip prosthesis, subsequent encounter: Secondary | ICD-10-CM | POA: Diagnosis not present

## 2023-04-25 DIAGNOSIS — K5901 Slow transit constipation: Secondary | ICD-10-CM | POA: Diagnosis not present

## 2023-04-25 DIAGNOSIS — G8918 Other acute postprocedural pain: Secondary | ICD-10-CM | POA: Diagnosis not present

## 2023-04-25 DIAGNOSIS — L03116 Cellulitis of left lower limb: Secondary | ICD-10-CM | POA: Diagnosis not present

## 2023-04-26 DIAGNOSIS — M810 Age-related osteoporosis without current pathological fracture: Secondary | ICD-10-CM | POA: Diagnosis not present

## 2023-04-26 DIAGNOSIS — G8918 Other acute postprocedural pain: Secondary | ICD-10-CM | POA: Diagnosis not present

## 2023-04-26 DIAGNOSIS — T84091D Other mechanical complication of internal left hip prosthesis, subsequent encounter: Secondary | ICD-10-CM | POA: Diagnosis not present

## 2023-04-26 DIAGNOSIS — L03116 Cellulitis of left lower limb: Secondary | ICD-10-CM | POA: Diagnosis not present

## 2023-04-26 DIAGNOSIS — K5901 Slow transit constipation: Secondary | ICD-10-CM | POA: Diagnosis not present

## 2023-04-28 DIAGNOSIS — M810 Age-related osteoporosis without current pathological fracture: Secondary | ICD-10-CM | POA: Diagnosis not present

## 2023-04-28 DIAGNOSIS — L03116 Cellulitis of left lower limb: Secondary | ICD-10-CM | POA: Diagnosis not present

## 2023-04-28 DIAGNOSIS — T84091D Other mechanical complication of internal left hip prosthesis, subsequent encounter: Secondary | ICD-10-CM | POA: Diagnosis not present

## 2023-04-28 DIAGNOSIS — G8918 Other acute postprocedural pain: Secondary | ICD-10-CM | POA: Diagnosis not present

## 2023-04-28 DIAGNOSIS — K5901 Slow transit constipation: Secondary | ICD-10-CM | POA: Diagnosis not present

## 2023-04-29 DIAGNOSIS — M1711 Unilateral primary osteoarthritis, right knee: Secondary | ICD-10-CM | POA: Diagnosis not present

## 2023-04-29 DIAGNOSIS — M1712 Unilateral primary osteoarthritis, left knee: Secondary | ICD-10-CM | POA: Diagnosis not present

## 2023-05-03 DIAGNOSIS — G8918 Other acute postprocedural pain: Secondary | ICD-10-CM | POA: Diagnosis not present

## 2023-05-03 DIAGNOSIS — M810 Age-related osteoporosis without current pathological fracture: Secondary | ICD-10-CM | POA: Diagnosis not present

## 2023-05-03 DIAGNOSIS — T84091D Other mechanical complication of internal left hip prosthesis, subsequent encounter: Secondary | ICD-10-CM | POA: Diagnosis not present

## 2023-05-03 DIAGNOSIS — K5901 Slow transit constipation: Secondary | ICD-10-CM | POA: Diagnosis not present

## 2023-05-03 DIAGNOSIS — L03116 Cellulitis of left lower limb: Secondary | ICD-10-CM | POA: Diagnosis not present

## 2023-05-04 DIAGNOSIS — Z471 Aftercare following joint replacement surgery: Secondary | ICD-10-CM | POA: Diagnosis not present

## 2023-05-04 DIAGNOSIS — Z96642 Presence of left artificial hip joint: Secondary | ICD-10-CM | POA: Diagnosis not present

## 2023-05-05 DIAGNOSIS — M810 Age-related osteoporosis without current pathological fracture: Secondary | ICD-10-CM | POA: Diagnosis not present

## 2023-05-05 DIAGNOSIS — K5901 Slow transit constipation: Secondary | ICD-10-CM | POA: Diagnosis not present

## 2023-05-05 DIAGNOSIS — G8918 Other acute postprocedural pain: Secondary | ICD-10-CM | POA: Diagnosis not present

## 2023-05-05 DIAGNOSIS — L03116 Cellulitis of left lower limb: Secondary | ICD-10-CM | POA: Diagnosis not present

## 2023-05-05 DIAGNOSIS — T84091D Other mechanical complication of internal left hip prosthesis, subsequent encounter: Secondary | ICD-10-CM | POA: Diagnosis not present

## 2023-05-10 DIAGNOSIS — L03116 Cellulitis of left lower limb: Secondary | ICD-10-CM | POA: Diagnosis not present

## 2023-05-10 DIAGNOSIS — G8918 Other acute postprocedural pain: Secondary | ICD-10-CM | POA: Diagnosis not present

## 2023-05-10 DIAGNOSIS — M810 Age-related osteoporosis without current pathological fracture: Secondary | ICD-10-CM | POA: Diagnosis not present

## 2023-05-10 DIAGNOSIS — T84091D Other mechanical complication of internal left hip prosthesis, subsequent encounter: Secondary | ICD-10-CM | POA: Diagnosis not present

## 2023-05-10 DIAGNOSIS — K5901 Slow transit constipation: Secondary | ICD-10-CM | POA: Diagnosis not present

## 2023-05-11 DIAGNOSIS — M810 Age-related osteoporosis without current pathological fracture: Secondary | ICD-10-CM | POA: Diagnosis not present

## 2023-05-11 DIAGNOSIS — T84091D Other mechanical complication of internal left hip prosthesis, subsequent encounter: Secondary | ICD-10-CM | POA: Diagnosis not present

## 2023-05-11 DIAGNOSIS — L03116 Cellulitis of left lower limb: Secondary | ICD-10-CM | POA: Diagnosis not present

## 2023-05-11 DIAGNOSIS — G8918 Other acute postprocedural pain: Secondary | ICD-10-CM | POA: Diagnosis not present

## 2023-05-11 DIAGNOSIS — K5901 Slow transit constipation: Secondary | ICD-10-CM | POA: Diagnosis not present

## 2023-05-13 DIAGNOSIS — D649 Anemia, unspecified: Secondary | ICD-10-CM | POA: Diagnosis not present

## 2023-05-13 DIAGNOSIS — S5001XD Contusion of right elbow, subsequent encounter: Secondary | ICD-10-CM | POA: Diagnosis not present

## 2023-05-13 DIAGNOSIS — M1990 Unspecified osteoarthritis, unspecified site: Secondary | ICD-10-CM | POA: Diagnosis not present

## 2023-05-13 DIAGNOSIS — Z6841 Body Mass Index (BMI) 40.0 and over, adult: Secondary | ICD-10-CM | POA: Diagnosis not present

## 2023-05-13 DIAGNOSIS — L03116 Cellulitis of left lower limb: Secondary | ICD-10-CM | POA: Diagnosis not present

## 2023-05-13 DIAGNOSIS — Z7985 Long-term (current) use of injectable non-insulin antidiabetic drugs: Secondary | ICD-10-CM | POA: Diagnosis not present

## 2023-05-13 DIAGNOSIS — F32A Depression, unspecified: Secondary | ICD-10-CM | POA: Diagnosis not present

## 2023-05-13 DIAGNOSIS — G4733 Obstructive sleep apnea (adult) (pediatric): Secondary | ICD-10-CM | POA: Diagnosis not present

## 2023-05-13 DIAGNOSIS — K5901 Slow transit constipation: Secondary | ICD-10-CM | POA: Diagnosis not present

## 2023-05-13 DIAGNOSIS — F419 Anxiety disorder, unspecified: Secondary | ICD-10-CM | POA: Diagnosis not present

## 2023-05-13 DIAGNOSIS — K219 Gastro-esophageal reflux disease without esophagitis: Secondary | ICD-10-CM | POA: Diagnosis not present

## 2023-05-13 DIAGNOSIS — F451 Undifferentiated somatoform disorder: Secondary | ICD-10-CM | POA: Diagnosis not present

## 2023-05-13 DIAGNOSIS — I10 Essential (primary) hypertension: Secondary | ICD-10-CM | POA: Diagnosis not present

## 2023-05-13 DIAGNOSIS — Z87891 Personal history of nicotine dependence: Secondary | ICD-10-CM | POA: Diagnosis not present

## 2023-05-13 DIAGNOSIS — Z7982 Long term (current) use of aspirin: Secondary | ICD-10-CM | POA: Diagnosis not present

## 2023-05-13 DIAGNOSIS — T84091D Other mechanical complication of internal left hip prosthesis, subsequent encounter: Secondary | ICD-10-CM | POA: Diagnosis not present

## 2023-05-13 DIAGNOSIS — E039 Hypothyroidism, unspecified: Secondary | ICD-10-CM | POA: Diagnosis not present

## 2023-05-13 DIAGNOSIS — G43909 Migraine, unspecified, not intractable, without status migrainosus: Secondary | ICD-10-CM | POA: Diagnosis not present

## 2023-05-13 DIAGNOSIS — Z9181 History of falling: Secondary | ICD-10-CM | POA: Diagnosis not present

## 2023-05-13 DIAGNOSIS — M810 Age-related osteoporosis without current pathological fracture: Secondary | ICD-10-CM | POA: Diagnosis not present

## 2023-05-13 DIAGNOSIS — G8918 Other acute postprocedural pain: Secondary | ICD-10-CM | POA: Diagnosis not present

## 2023-05-13 DIAGNOSIS — H409 Unspecified glaucoma: Secondary | ICD-10-CM | POA: Diagnosis not present

## 2023-05-13 DIAGNOSIS — F39 Unspecified mood [affective] disorder: Secondary | ICD-10-CM | POA: Diagnosis not present

## 2023-05-23 DIAGNOSIS — G2581 Restless legs syndrome: Secondary | ICD-10-CM | POA: Diagnosis not present

## 2023-05-23 DIAGNOSIS — G4733 Obstructive sleep apnea (adult) (pediatric): Secondary | ICD-10-CM | POA: Diagnosis not present

## 2023-05-24 DIAGNOSIS — M810 Age-related osteoporosis without current pathological fracture: Secondary | ICD-10-CM | POA: Diagnosis not present

## 2023-05-24 DIAGNOSIS — G8918 Other acute postprocedural pain: Secondary | ICD-10-CM | POA: Diagnosis not present

## 2023-05-24 DIAGNOSIS — T84091D Other mechanical complication of internal left hip prosthesis, subsequent encounter: Secondary | ICD-10-CM | POA: Diagnosis not present

## 2023-05-24 DIAGNOSIS — K5901 Slow transit constipation: Secondary | ICD-10-CM | POA: Diagnosis not present

## 2023-05-24 DIAGNOSIS — L03116 Cellulitis of left lower limb: Secondary | ICD-10-CM | POA: Diagnosis not present

## 2023-05-25 DIAGNOSIS — G8918 Other acute postprocedural pain: Secondary | ICD-10-CM | POA: Diagnosis not present

## 2023-05-25 DIAGNOSIS — L03116 Cellulitis of left lower limb: Secondary | ICD-10-CM | POA: Diagnosis not present

## 2023-05-25 DIAGNOSIS — M810 Age-related osteoporosis without current pathological fracture: Secondary | ICD-10-CM | POA: Diagnosis not present

## 2023-05-25 DIAGNOSIS — K5901 Slow transit constipation: Secondary | ICD-10-CM | POA: Diagnosis not present

## 2023-05-25 DIAGNOSIS — T84091D Other mechanical complication of internal left hip prosthesis, subsequent encounter: Secondary | ICD-10-CM | POA: Diagnosis not present

## 2023-05-26 DIAGNOSIS — M47816 Spondylosis without myelopathy or radiculopathy, lumbar region: Secondary | ICD-10-CM | POA: Diagnosis not present

## 2023-05-26 DIAGNOSIS — S52131K Displaced fracture of neck of right radius, subsequent encounter for closed fracture with nonunion: Secondary | ICD-10-CM | POA: Diagnosis not present

## 2023-05-26 DIAGNOSIS — M51362 Other intervertebral disc degeneration, lumbar region with discogenic back pain and lower extremity pain: Secondary | ICD-10-CM | POA: Diagnosis not present

## 2023-05-26 DIAGNOSIS — M48061 Spinal stenosis, lumbar region without neurogenic claudication: Secondary | ICD-10-CM | POA: Diagnosis not present

## 2023-05-31 DIAGNOSIS — K5901 Slow transit constipation: Secondary | ICD-10-CM | POA: Diagnosis not present

## 2023-05-31 DIAGNOSIS — M810 Age-related osteoporosis without current pathological fracture: Secondary | ICD-10-CM | POA: Diagnosis not present

## 2023-05-31 DIAGNOSIS — L03116 Cellulitis of left lower limb: Secondary | ICD-10-CM | POA: Diagnosis not present

## 2023-05-31 DIAGNOSIS — G8918 Other acute postprocedural pain: Secondary | ICD-10-CM | POA: Diagnosis not present

## 2023-05-31 DIAGNOSIS — T84091D Other mechanical complication of internal left hip prosthesis, subsequent encounter: Secondary | ICD-10-CM | POA: Diagnosis not present

## 2023-06-01 DIAGNOSIS — H1033 Unspecified acute conjunctivitis, bilateral: Secondary | ICD-10-CM | POA: Diagnosis not present

## 2023-06-01 DIAGNOSIS — T84091D Other mechanical complication of internal left hip prosthesis, subsequent encounter: Secondary | ICD-10-CM | POA: Diagnosis not present

## 2023-06-01 DIAGNOSIS — M810 Age-related osteoporosis without current pathological fracture: Secondary | ICD-10-CM | POA: Diagnosis not present

## 2023-06-01 DIAGNOSIS — K5901 Slow transit constipation: Secondary | ICD-10-CM | POA: Diagnosis not present

## 2023-06-01 DIAGNOSIS — G8918 Other acute postprocedural pain: Secondary | ICD-10-CM | POA: Diagnosis not present

## 2023-06-01 DIAGNOSIS — L03116 Cellulitis of left lower limb: Secondary | ICD-10-CM | POA: Diagnosis not present

## 2023-06-02 DIAGNOSIS — G894 Chronic pain syndrome: Secondary | ICD-10-CM | POA: Diagnosis not present

## 2023-06-02 DIAGNOSIS — F50819 Binge eating disorder, unspecified: Secondary | ICD-10-CM | POA: Diagnosis not present

## 2023-06-02 DIAGNOSIS — Z6841 Body Mass Index (BMI) 40.0 and over, adult: Secondary | ICD-10-CM | POA: Diagnosis not present

## 2023-06-02 DIAGNOSIS — E782 Mixed hyperlipidemia: Secondary | ICD-10-CM | POA: Diagnosis not present

## 2023-06-02 DIAGNOSIS — E559 Vitamin D deficiency, unspecified: Secondary | ICD-10-CM | POA: Diagnosis not present

## 2023-06-02 DIAGNOSIS — I10 Essential (primary) hypertension: Secondary | ICD-10-CM | POA: Diagnosis not present

## 2023-06-02 DIAGNOSIS — G4733 Obstructive sleep apnea (adult) (pediatric): Secondary | ICD-10-CM | POA: Diagnosis not present

## 2023-06-02 DIAGNOSIS — R7303 Prediabetes: Secondary | ICD-10-CM | POA: Diagnosis not present

## 2023-06-02 DIAGNOSIS — E039 Hypothyroidism, unspecified: Secondary | ICD-10-CM | POA: Diagnosis not present

## 2023-06-07 DIAGNOSIS — M7061 Trochanteric bursitis, right hip: Secondary | ICD-10-CM | POA: Diagnosis not present

## 2023-06-07 DIAGNOSIS — L03116 Cellulitis of left lower limb: Secondary | ICD-10-CM | POA: Diagnosis not present

## 2023-06-07 DIAGNOSIS — K5901 Slow transit constipation: Secondary | ICD-10-CM | POA: Diagnosis not present

## 2023-06-07 DIAGNOSIS — T84091D Other mechanical complication of internal left hip prosthesis, subsequent encounter: Secondary | ICD-10-CM | POA: Diagnosis not present

## 2023-06-07 DIAGNOSIS — M810 Age-related osteoporosis without current pathological fracture: Secondary | ICD-10-CM | POA: Diagnosis not present

## 2023-06-07 DIAGNOSIS — G8918 Other acute postprocedural pain: Secondary | ICD-10-CM | POA: Diagnosis not present

## 2023-06-07 DIAGNOSIS — M25551 Pain in right hip: Secondary | ICD-10-CM | POA: Diagnosis not present

## 2023-06-09 DIAGNOSIS — K5901 Slow transit constipation: Secondary | ICD-10-CM | POA: Diagnosis not present

## 2023-06-09 DIAGNOSIS — T84091D Other mechanical complication of internal left hip prosthesis, subsequent encounter: Secondary | ICD-10-CM | POA: Diagnosis not present

## 2023-06-09 DIAGNOSIS — L03116 Cellulitis of left lower limb: Secondary | ICD-10-CM | POA: Diagnosis not present

## 2023-06-09 DIAGNOSIS — G8918 Other acute postprocedural pain: Secondary | ICD-10-CM | POA: Diagnosis not present

## 2023-06-09 DIAGNOSIS — M810 Age-related osteoporosis without current pathological fracture: Secondary | ICD-10-CM | POA: Diagnosis not present

## 2023-06-14 DIAGNOSIS — Z23 Encounter for immunization: Secondary | ICD-10-CM | POA: Diagnosis not present

## 2023-07-04 DIAGNOSIS — H47233 Glaucomatous optic atrophy, bilateral: Secondary | ICD-10-CM | POA: Diagnosis not present

## 2023-07-04 DIAGNOSIS — H401131 Primary open-angle glaucoma, bilateral, mild stage: Secondary | ICD-10-CM | POA: Diagnosis not present

## 2023-07-04 DIAGNOSIS — H4010X1 Unspecified open-angle glaucoma, mild stage: Secondary | ICD-10-CM | POA: Diagnosis not present

## 2023-07-06 DIAGNOSIS — R7303 Prediabetes: Secondary | ICD-10-CM | POA: Diagnosis not present

## 2023-07-06 DIAGNOSIS — I10 Essential (primary) hypertension: Secondary | ICD-10-CM | POA: Diagnosis not present

## 2023-07-06 DIAGNOSIS — F32A Depression, unspecified: Secondary | ICD-10-CM | POA: Diagnosis not present

## 2023-07-06 DIAGNOSIS — F50819 Binge eating disorder, unspecified: Secondary | ICD-10-CM | POA: Diagnosis not present

## 2023-07-06 DIAGNOSIS — G894 Chronic pain syndrome: Secondary | ICD-10-CM | POA: Diagnosis not present

## 2023-07-06 DIAGNOSIS — G4733 Obstructive sleep apnea (adult) (pediatric): Secondary | ICD-10-CM | POA: Diagnosis not present

## 2023-07-06 DIAGNOSIS — E559 Vitamin D deficiency, unspecified: Secondary | ICD-10-CM | POA: Diagnosis not present

## 2023-07-06 DIAGNOSIS — Z6839 Body mass index (BMI) 39.0-39.9, adult: Secondary | ICD-10-CM | POA: Diagnosis not present

## 2023-07-06 DIAGNOSIS — E039 Hypothyroidism, unspecified: Secondary | ICD-10-CM | POA: Diagnosis not present

## 2023-07-06 DIAGNOSIS — E782 Mixed hyperlipidemia: Secondary | ICD-10-CM | POA: Diagnosis not present

## 2023-07-22 DIAGNOSIS — Z471 Aftercare following joint replacement surgery: Secondary | ICD-10-CM | POA: Diagnosis not present

## 2023-07-22 DIAGNOSIS — Z96642 Presence of left artificial hip joint: Secondary | ICD-10-CM | POA: Diagnosis not present

## 2023-07-22 DIAGNOSIS — M7061 Trochanteric bursitis, right hip: Secondary | ICD-10-CM | POA: Diagnosis not present

## 2023-08-04 DIAGNOSIS — R7303 Prediabetes: Secondary | ICD-10-CM | POA: Diagnosis not present

## 2023-08-04 DIAGNOSIS — G4733 Obstructive sleep apnea (adult) (pediatric): Secondary | ICD-10-CM | POA: Diagnosis not present

## 2023-08-04 DIAGNOSIS — E559 Vitamin D deficiency, unspecified: Secondary | ICD-10-CM | POA: Diagnosis not present

## 2023-08-04 DIAGNOSIS — Z6839 Body mass index (BMI) 39.0-39.9, adult: Secondary | ICD-10-CM | POA: Diagnosis not present

## 2023-08-04 DIAGNOSIS — E782 Mixed hyperlipidemia: Secondary | ICD-10-CM | POA: Diagnosis not present

## 2023-08-04 DIAGNOSIS — I10 Essential (primary) hypertension: Secondary | ICD-10-CM | POA: Diagnosis not present

## 2023-08-04 DIAGNOSIS — E039 Hypothyroidism, unspecified: Secondary | ICD-10-CM | POA: Diagnosis not present

## 2023-08-04 DIAGNOSIS — G894 Chronic pain syndrome: Secondary | ICD-10-CM | POA: Diagnosis not present

## 2023-08-04 DIAGNOSIS — F32A Depression, unspecified: Secondary | ICD-10-CM | POA: Diagnosis not present

## 2023-08-04 DIAGNOSIS — F50819 Binge eating disorder, unspecified: Secondary | ICD-10-CM | POA: Diagnosis not present

## 2023-08-08 DIAGNOSIS — G2581 Restless legs syndrome: Secondary | ICD-10-CM | POA: Diagnosis not present

## 2023-08-08 DIAGNOSIS — G4733 Obstructive sleep apnea (adult) (pediatric): Secondary | ICD-10-CM | POA: Diagnosis not present

## 2023-08-17 DIAGNOSIS — M7061 Trochanteric bursitis, right hip: Secondary | ICD-10-CM | POA: Diagnosis not present

## 2023-09-02 DIAGNOSIS — L03116 Cellulitis of left lower limb: Secondary | ICD-10-CM | POA: Diagnosis not present

## 2023-09-02 DIAGNOSIS — R600 Localized edema: Secondary | ICD-10-CM | POA: Diagnosis not present

## 2023-09-02 DIAGNOSIS — S8012XA Contusion of left lower leg, initial encounter: Secondary | ICD-10-CM | POA: Diagnosis not present

## 2023-09-09 DIAGNOSIS — M1711 Unilateral primary osteoarthritis, right knee: Secondary | ICD-10-CM | POA: Diagnosis not present

## 2023-09-14 DIAGNOSIS — F32A Depression, unspecified: Secondary | ICD-10-CM | POA: Diagnosis not present

## 2023-09-14 DIAGNOSIS — E559 Vitamin D deficiency, unspecified: Secondary | ICD-10-CM | POA: Diagnosis not present

## 2023-09-14 DIAGNOSIS — F50819 Binge eating disorder, unspecified: Secondary | ICD-10-CM | POA: Diagnosis not present

## 2023-09-14 DIAGNOSIS — E782 Mixed hyperlipidemia: Secondary | ICD-10-CM | POA: Diagnosis not present

## 2023-09-14 DIAGNOSIS — R7303 Prediabetes: Secondary | ICD-10-CM | POA: Diagnosis not present

## 2023-09-14 DIAGNOSIS — G4733 Obstructive sleep apnea (adult) (pediatric): Secondary | ICD-10-CM | POA: Diagnosis not present

## 2023-09-14 DIAGNOSIS — E039 Hypothyroidism, unspecified: Secondary | ICD-10-CM | POA: Diagnosis not present

## 2023-09-14 DIAGNOSIS — Z6839 Body mass index (BMI) 39.0-39.9, adult: Secondary | ICD-10-CM | POA: Diagnosis not present

## 2023-09-14 DIAGNOSIS — I10 Essential (primary) hypertension: Secondary | ICD-10-CM | POA: Diagnosis not present

## 2023-09-14 DIAGNOSIS — G894 Chronic pain syndrome: Secondary | ICD-10-CM | POA: Diagnosis not present

## 2023-09-16 DIAGNOSIS — L03116 Cellulitis of left lower limb: Secondary | ICD-10-CM | POA: Diagnosis not present

## 2023-09-21 DIAGNOSIS — M5416 Radiculopathy, lumbar region: Secondary | ICD-10-CM | POA: Diagnosis not present

## 2023-09-21 DIAGNOSIS — M7061 Trochanteric bursitis, right hip: Secondary | ICD-10-CM | POA: Diagnosis not present

## 2023-09-27 DIAGNOSIS — H401131 Primary open-angle glaucoma, bilateral, mild stage: Secondary | ICD-10-CM | POA: Diagnosis not present

## 2023-09-27 DIAGNOSIS — H47233 Glaucomatous optic atrophy, bilateral: Secondary | ICD-10-CM | POA: Diagnosis not present

## 2023-09-28 DIAGNOSIS — M25551 Pain in right hip: Secondary | ICD-10-CM | POA: Diagnosis not present

## 2023-09-28 DIAGNOSIS — M25562 Pain in left knee: Secondary | ICD-10-CM | POA: Diagnosis not present

## 2023-09-28 DIAGNOSIS — M25561 Pain in right knee: Secondary | ICD-10-CM | POA: Diagnosis not present

## 2023-09-28 DIAGNOSIS — R2689 Other abnormalities of gait and mobility: Secondary | ICD-10-CM | POA: Diagnosis not present

## 2023-09-28 DIAGNOSIS — M6281 Muscle weakness (generalized): Secondary | ICD-10-CM | POA: Diagnosis not present

## 2023-10-03 DIAGNOSIS — M25562 Pain in left knee: Secondary | ICD-10-CM | POA: Diagnosis not present

## 2023-10-03 DIAGNOSIS — M25561 Pain in right knee: Secondary | ICD-10-CM | POA: Diagnosis not present

## 2023-10-03 DIAGNOSIS — M25551 Pain in right hip: Secondary | ICD-10-CM | POA: Diagnosis not present

## 2023-10-03 DIAGNOSIS — M6281 Muscle weakness (generalized): Secondary | ICD-10-CM | POA: Diagnosis not present

## 2023-10-03 DIAGNOSIS — R2689 Other abnormalities of gait and mobility: Secondary | ICD-10-CM | POA: Diagnosis not present

## 2023-10-06 DIAGNOSIS — Z79899 Other long term (current) drug therapy: Secondary | ICD-10-CM | POA: Diagnosis not present

## 2023-10-06 DIAGNOSIS — S52131K Displaced fracture of neck of right radius, subsequent encounter for closed fracture with nonunion: Secondary | ICD-10-CM | POA: Diagnosis not present

## 2023-10-06 DIAGNOSIS — Z5181 Encounter for therapeutic drug level monitoring: Secondary | ICD-10-CM | POA: Diagnosis not present

## 2023-10-06 DIAGNOSIS — M51362 Other intervertebral disc degeneration, lumbar region with discogenic back pain and lower extremity pain: Secondary | ICD-10-CM | POA: Diagnosis not present

## 2023-10-06 DIAGNOSIS — M48061 Spinal stenosis, lumbar region without neurogenic claudication: Secondary | ICD-10-CM | POA: Diagnosis not present

## 2023-10-06 DIAGNOSIS — M47816 Spondylosis without myelopathy or radiculopathy, lumbar region: Secondary | ICD-10-CM | POA: Diagnosis not present

## 2023-10-07 DIAGNOSIS — M25551 Pain in right hip: Secondary | ICD-10-CM | POA: Diagnosis not present

## 2023-10-07 DIAGNOSIS — M6281 Muscle weakness (generalized): Secondary | ICD-10-CM | POA: Diagnosis not present

## 2023-10-07 DIAGNOSIS — R2689 Other abnormalities of gait and mobility: Secondary | ICD-10-CM | POA: Diagnosis not present

## 2023-10-07 DIAGNOSIS — M25561 Pain in right knee: Secondary | ICD-10-CM | POA: Diagnosis not present

## 2023-10-07 DIAGNOSIS — M25562 Pain in left knee: Secondary | ICD-10-CM | POA: Diagnosis not present

## 2023-10-10 DIAGNOSIS — M6281 Muscle weakness (generalized): Secondary | ICD-10-CM | POA: Diagnosis not present

## 2023-10-10 DIAGNOSIS — M25551 Pain in right hip: Secondary | ICD-10-CM | POA: Diagnosis not present

## 2023-10-10 DIAGNOSIS — M25561 Pain in right knee: Secondary | ICD-10-CM | POA: Diagnosis not present

## 2023-10-10 DIAGNOSIS — M25562 Pain in left knee: Secondary | ICD-10-CM | POA: Diagnosis not present

## 2023-10-10 DIAGNOSIS — R2689 Other abnormalities of gait and mobility: Secondary | ICD-10-CM | POA: Diagnosis not present

## 2023-10-12 DIAGNOSIS — I10 Essential (primary) hypertension: Secondary | ICD-10-CM | POA: Diagnosis not present

## 2023-10-12 DIAGNOSIS — Z6839 Body mass index (BMI) 39.0-39.9, adult: Secondary | ICD-10-CM | POA: Diagnosis not present

## 2023-10-12 DIAGNOSIS — G894 Chronic pain syndrome: Secondary | ICD-10-CM | POA: Diagnosis not present

## 2023-10-12 DIAGNOSIS — G4733 Obstructive sleep apnea (adult) (pediatric): Secondary | ICD-10-CM | POA: Diagnosis not present

## 2023-10-12 DIAGNOSIS — F32A Depression, unspecified: Secondary | ICD-10-CM | POA: Diagnosis not present

## 2023-10-12 DIAGNOSIS — E559 Vitamin D deficiency, unspecified: Secondary | ICD-10-CM | POA: Diagnosis not present

## 2023-10-12 DIAGNOSIS — F50819 Binge eating disorder, unspecified: Secondary | ICD-10-CM | POA: Diagnosis not present

## 2023-10-12 DIAGNOSIS — E039 Hypothyroidism, unspecified: Secondary | ICD-10-CM | POA: Diagnosis not present

## 2023-10-12 DIAGNOSIS — E782 Mixed hyperlipidemia: Secondary | ICD-10-CM | POA: Diagnosis not present

## 2023-10-12 DIAGNOSIS — R7303 Prediabetes: Secondary | ICD-10-CM | POA: Diagnosis not present

## 2023-10-14 DIAGNOSIS — M25551 Pain in right hip: Secondary | ICD-10-CM | POA: Diagnosis not present

## 2023-10-14 DIAGNOSIS — M6281 Muscle weakness (generalized): Secondary | ICD-10-CM | POA: Diagnosis not present

## 2023-10-14 DIAGNOSIS — M25561 Pain in right knee: Secondary | ICD-10-CM | POA: Diagnosis not present

## 2023-10-14 DIAGNOSIS — R2689 Other abnormalities of gait and mobility: Secondary | ICD-10-CM | POA: Diagnosis not present

## 2023-10-14 DIAGNOSIS — M25562 Pain in left knee: Secondary | ICD-10-CM | POA: Diagnosis not present

## 2023-10-17 DIAGNOSIS — M25561 Pain in right knee: Secondary | ICD-10-CM | POA: Diagnosis not present

## 2023-10-17 DIAGNOSIS — M25562 Pain in left knee: Secondary | ICD-10-CM | POA: Diagnosis not present

## 2023-10-17 DIAGNOSIS — M25551 Pain in right hip: Secondary | ICD-10-CM | POA: Diagnosis not present

## 2023-10-17 DIAGNOSIS — R2689 Other abnormalities of gait and mobility: Secondary | ICD-10-CM | POA: Diagnosis not present

## 2023-10-17 DIAGNOSIS — M6281 Muscle weakness (generalized): Secondary | ICD-10-CM | POA: Diagnosis not present

## 2023-10-24 DIAGNOSIS — R2689 Other abnormalities of gait and mobility: Secondary | ICD-10-CM | POA: Diagnosis not present

## 2023-10-24 DIAGNOSIS — M25561 Pain in right knee: Secondary | ICD-10-CM | POA: Diagnosis not present

## 2023-10-24 DIAGNOSIS — M25551 Pain in right hip: Secondary | ICD-10-CM | POA: Diagnosis not present

## 2023-10-24 DIAGNOSIS — M6281 Muscle weakness (generalized): Secondary | ICD-10-CM | POA: Diagnosis not present

## 2023-10-24 DIAGNOSIS — M25562 Pain in left knee: Secondary | ICD-10-CM | POA: Diagnosis not present

## 2023-10-25 DIAGNOSIS — K219 Gastro-esophageal reflux disease without esophagitis: Secondary | ICD-10-CM | POA: Diagnosis not present

## 2023-10-25 DIAGNOSIS — I38 Endocarditis, valve unspecified: Secondary | ICD-10-CM | POA: Diagnosis not present

## 2023-10-25 DIAGNOSIS — Z0000 Encounter for general adult medical examination without abnormal findings: Secondary | ICD-10-CM | POA: Diagnosis not present

## 2023-10-25 DIAGNOSIS — M5136 Other intervertebral disc degeneration, lumbar region with discogenic back pain only: Secondary | ICD-10-CM | POA: Diagnosis not present

## 2023-10-25 DIAGNOSIS — R61 Generalized hyperhidrosis: Secondary | ICD-10-CM | POA: Diagnosis not present

## 2023-10-25 DIAGNOSIS — E039 Hypothyroidism, unspecified: Secondary | ICD-10-CM | POA: Diagnosis not present

## 2023-10-25 DIAGNOSIS — Z6839 Body mass index (BMI) 39.0-39.9, adult: Secondary | ICD-10-CM | POA: Diagnosis not present

## 2023-10-25 DIAGNOSIS — M4310 Spondylolisthesis, site unspecified: Secondary | ICD-10-CM | POA: Diagnosis not present

## 2023-10-25 DIAGNOSIS — Z79899 Other long term (current) drug therapy: Secondary | ICD-10-CM | POA: Diagnosis not present

## 2023-10-25 DIAGNOSIS — D649 Anemia, unspecified: Secondary | ICD-10-CM | POA: Diagnosis not present

## 2023-10-25 DIAGNOSIS — I10 Essential (primary) hypertension: Secondary | ICD-10-CM | POA: Diagnosis not present

## 2023-10-25 DIAGNOSIS — M170 Bilateral primary osteoarthritis of knee: Secondary | ICD-10-CM | POA: Diagnosis not present

## 2023-10-25 DIAGNOSIS — N952 Postmenopausal atrophic vaginitis: Secondary | ICD-10-CM | POA: Diagnosis not present

## 2023-10-26 DIAGNOSIS — R7303 Prediabetes: Secondary | ICD-10-CM | POA: Diagnosis not present

## 2023-10-26 DIAGNOSIS — D649 Anemia, unspecified: Secondary | ICD-10-CM | POA: Diagnosis not present

## 2023-10-26 DIAGNOSIS — E782 Mixed hyperlipidemia: Secondary | ICD-10-CM | POA: Diagnosis not present

## 2023-10-26 DIAGNOSIS — R5381 Other malaise: Secondary | ICD-10-CM | POA: Diagnosis not present

## 2023-10-26 DIAGNOSIS — Z87440 Personal history of urinary (tract) infections: Secondary | ICD-10-CM | POA: Diagnosis not present

## 2023-10-26 DIAGNOSIS — R8290 Unspecified abnormal findings in urine: Secondary | ICD-10-CM | POA: Diagnosis not present

## 2023-10-26 DIAGNOSIS — R5383 Other fatigue: Secondary | ICD-10-CM | POA: Diagnosis not present

## 2023-10-28 DIAGNOSIS — M25551 Pain in right hip: Secondary | ICD-10-CM | POA: Diagnosis not present

## 2023-10-28 DIAGNOSIS — M25561 Pain in right knee: Secondary | ICD-10-CM | POA: Diagnosis not present

## 2023-10-28 DIAGNOSIS — M25562 Pain in left knee: Secondary | ICD-10-CM | POA: Diagnosis not present

## 2023-10-28 DIAGNOSIS — M6281 Muscle weakness (generalized): Secondary | ICD-10-CM | POA: Diagnosis not present

## 2023-10-28 DIAGNOSIS — R2689 Other abnormalities of gait and mobility: Secondary | ICD-10-CM | POA: Diagnosis not present

## 2023-10-31 DIAGNOSIS — R2689 Other abnormalities of gait and mobility: Secondary | ICD-10-CM | POA: Diagnosis not present

## 2023-10-31 DIAGNOSIS — M6281 Muscle weakness (generalized): Secondary | ICD-10-CM | POA: Diagnosis not present

## 2023-10-31 DIAGNOSIS — M25551 Pain in right hip: Secondary | ICD-10-CM | POA: Diagnosis not present

## 2023-10-31 DIAGNOSIS — M25561 Pain in right knee: Secondary | ICD-10-CM | POA: Diagnosis not present

## 2023-10-31 DIAGNOSIS — M25562 Pain in left knee: Secondary | ICD-10-CM | POA: Diagnosis not present

## 2023-11-04 DIAGNOSIS — H47233 Glaucomatous optic atrophy, bilateral: Secondary | ICD-10-CM | POA: Diagnosis not present

## 2023-11-04 DIAGNOSIS — M6281 Muscle weakness (generalized): Secondary | ICD-10-CM | POA: Diagnosis not present

## 2023-11-04 DIAGNOSIS — H401131 Primary open-angle glaucoma, bilateral, mild stage: Secondary | ICD-10-CM | POA: Diagnosis not present

## 2023-11-04 DIAGNOSIS — M25561 Pain in right knee: Secondary | ICD-10-CM | POA: Diagnosis not present

## 2023-11-04 DIAGNOSIS — M25551 Pain in right hip: Secondary | ICD-10-CM | POA: Diagnosis not present

## 2023-11-04 DIAGNOSIS — M25562 Pain in left knee: Secondary | ICD-10-CM | POA: Diagnosis not present

## 2023-11-04 DIAGNOSIS — R2689 Other abnormalities of gait and mobility: Secondary | ICD-10-CM | POA: Diagnosis not present

## 2023-11-07 DIAGNOSIS — M25562 Pain in left knee: Secondary | ICD-10-CM | POA: Diagnosis not present

## 2023-11-07 DIAGNOSIS — R2689 Other abnormalities of gait and mobility: Secondary | ICD-10-CM | POA: Diagnosis not present

## 2023-11-07 DIAGNOSIS — M25551 Pain in right hip: Secondary | ICD-10-CM | POA: Diagnosis not present

## 2023-11-07 DIAGNOSIS — M6281 Muscle weakness (generalized): Secondary | ICD-10-CM | POA: Diagnosis not present

## 2023-11-07 DIAGNOSIS — M25561 Pain in right knee: Secondary | ICD-10-CM | POA: Diagnosis not present

## 2023-11-21 DIAGNOSIS — N390 Urinary tract infection, site not specified: Secondary | ICD-10-CM | POA: Diagnosis not present

## 2023-12-08 DIAGNOSIS — M1711 Unilateral primary osteoarthritis, right knee: Secondary | ICD-10-CM | POA: Diagnosis not present

## 2023-12-08 DIAGNOSIS — M1712 Unilateral primary osteoarthritis, left knee: Secondary | ICD-10-CM | POA: Diagnosis not present

## 2024-02-07 ENCOUNTER — Ambulatory Visit: Attending: Cardiology | Admitting: Cardiology

## 2024-02-07 ENCOUNTER — Encounter: Payer: Self-pay | Admitting: Cardiology

## 2024-02-07 VITALS — BP 130/82 | HR 78 | Ht 66.0 in | Wt 247.6 lb

## 2024-02-07 DIAGNOSIS — I10 Essential (primary) hypertension: Secondary | ICD-10-CM | POA: Diagnosis not present

## 2024-02-07 DIAGNOSIS — E782 Mixed hyperlipidemia: Secondary | ICD-10-CM | POA: Insufficient documentation

## 2024-02-07 DIAGNOSIS — G4730 Sleep apnea, unspecified: Secondary | ICD-10-CM | POA: Insufficient documentation

## 2024-02-07 DIAGNOSIS — Z6841 Body Mass Index (BMI) 40.0 and over, adult: Secondary | ICD-10-CM | POA: Diagnosis present

## 2024-02-07 DIAGNOSIS — M7061 Trochanteric bursitis, right hip: Secondary | ICD-10-CM | POA: Diagnosis not present

## 2024-02-07 NOTE — Patient Instructions (Signed)

## 2024-02-07 NOTE — Progress Notes (Signed)
 Cardiology Office Note:    Date:  02/07/2024   ID:  SHAKERIA ROBINETTE, DOB 01/31/1946, MRN [id number]  PCP:  Benson [name], NP  Cardiologist:  Jennifer JONELLE Crape, MD   Referring MD: Benson Eleanor PARAS*    ASSESSMENT:    1. Benign essential hypertension   2. Sleep apnea, unspecified type   3. Morbid obesity with BMI of 40.0-44.9, adult (HCC)   4. Mixed hyperlipidemia    PLAN:    In order of problems listed above:  Primary prevention stressed with the patient.  Importance of compliance with diet medication stressed and patient verbalized standing. Essential hypertension: Blood pressure is stable and diet was emphasized.  Lifestyle modification urged. Sleep apnea: Sleep health issues were discussed. Hyperlipidemia: Lipids reviewed from records and discussed with the patient and diet advised. Morbid obesity: Lifestyle modification urged and she promises to do better.  Risks of obesity explained. Patient will be seen in follow-up appointment in 6 months or earlier if the patient has any concerns.    Medication Adjustments/Labs and Tests Ordered: Current medicines are reviewed at length with the patient today.  Concerns regarding medicines are outlined above.  Orders Placed This Encounter  Procedures   EKG 12-Lead   No orders of the defined types were placed in this encounter.    No chief complaint on file.    History of Present Illness:    Kendra [name] is a 78 y.o. female.  Patient has past medical history of essential hypertension, sleep apnea and morbid obesity.  She has history of mixed dyslipidemia.  She underwent hip surgery which was complicated with a fracture and subsequently needed repeat surgery.  Since then she leads a sedentary lifestyle.  She denies any chest pain orthopnea or PND.  She is comfortable.  At the time of my evaluation, the patient is alert awake oriented and in no distress.  Past Medical History:  Diagnosis Date   Anemia     Atrophic vaginitis 12/26/2019   Formatting of this note might be different from the original. Follows along with urology uses bioidentical hormone   Benign essential hypertension 10/08/2015   Chest pain in adult 07/02/2015   Formatting of this note might be different from the original. 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 L3/4 7.2 mm with L4/5 mod to severe spinal  stenosis, L5/S1 with slight disc on the right Formatting of this note might be different from the original. 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   Procedure: TOTAL HIP ARTHROPLASTY ANTERIOR APPROACH;  Surgeon: Ernie Cough, MD;  Location: [facility] ORS;  Service: Orthopedics;  Laterality: Left;   TOTAL HIP ARTHROPLASTY Left 03/20/2023   Procedure: TOTAL HIP ARTHROPLASTY ANTERIOR APPROACH;  Surgeon: Ernie Cough, MD;  Location: [facility] ORS;  Service: Orthopedics;  Laterality: Left;    Current Medications: Current Meds  Medication Sig   buPROPion (WELLBUTRIN XL) 150 MG 24 hr tablet 1 tablet in the morning Orally Once a day; Duration: 90 days   celecoxib  (CELEBREX ) 200 MG capsule Take 1 capsule (200 mg total) TWICE DAILY     Allergies:   Bee venom, Feldene [piroxicam], Flexeril [cyclobenzaprine], Cephalosporins, and Keflex [cephalexin]   Social History   Socioeconomic History   Marital status: Married    Spouse name: Not on file   Number of children: Not on file   Years of education: Not on file   Highest education level: Not on file  Occupational History   Not on file  Tobacco Use   Smoking status: Former   Smokeless tobacco: Never  Vaping Use   Vaping status: Never Used  Substance and Sexual Activity   Alcohol use: Yes    Alcohol/week: 6.0 standard drinks of alcohol    Types: 2 Glasses of wine, 2 Cans of beer, 2 Shots of liquor per week    Comment: 2 a day   Drug use: Never   Sexual activity: Not on file  Other Topics Concern   Not on file  Social History Narrative   Not on file   Social Drivers of Health   Financial Resource Strain: Low Risk  (12/22/2020)   Received from [facility] visits prior to 09/25/2022.   Overall Financial Resource Strain (CARDIA)    Difficulty of Paying Living Expenses: Not hard at all  Food Insecurity: No Food Insecurity (03/17/2023)   Hunger Vital Sign    Worried About Running Out of Food in the Last Year: Never true     Ran Out of Food in the Last Year: Never true  Transportation Needs: No Transportation Needs (03/17/2023)   PRAPARE - Administrator, Civil Service (Medical): No    Lack of Transportation (Non-Medical): No  Physical Activity: Unknown (12/22/2020)   Received from [facility] visits prior to 09/25/2022.   Exercise Vital Sign    On average, how many days per week do you engage in moderate to strenuous exercise (like a brisk walk)?: Patient refused    Minutes of Exercise per Session: Not on file  Stress: Stress Concern Present (12/22/2020)   Received from [facility] visits prior to 09/25/2022.   [employer] of Occupational Health - Occupational Stress Questionnaire    Feeling of  Stress : To some extent  Social Connections: Moderately Integrated (12/22/2020)   Received from [facility] visits prior to 09/25/2022.   Social Connection and Isolation Panel    In a typical week, how many times do you talk on the phone with family, friends, or neighbors?: More than three times a week    How often do you get together with friends or relatives?: More than three times a week    How often do you attend church or religious services?: More than 4 times per year    Do you belong to any clubs or organizations such as church groups, unions, fraternal or athletic groups, or school groups?: Yes    How often do you attend meetings of the clubs or organizations you belong to?: More than 4 times per year    Are you married, widowed, divorced, separated, never married, or living with a partner?: Widowed     Family History: The patient's family history includes Cancer - Prostate in her father.  ROS:   Please see the history of present illness.    All other systems reviewed and are negative.  EKGs/Labs/Other Studies Reviewed:    The following studies were reviewed today: .SABRAEKG Interpretation Date/Time:  Tuesday February 07 2024 13:34:18  EDT Ventricular Rate:  78 PR Interval:  150 QRS Duration:  84 QT Interval:  384 QTC Calculation: 437 R Axis:   16  Text Interpretation: Normal sinus rhythm Possible Anterior infarct , age undetermined When compared with ECG of 17-Mar-2023 10:14, No significant change was found Confirmed by Edwyna Backers [phone]) on 02/07/2024 2:00:41 PM     Recent Labs: 03/17/2023: ALT 11 03/20/2023: Magnesium 2.3 03/21/2023: BUN 14; Creatinine, Ser 0.49; Hemoglobin 8.3; Platelets 352; Potassium 3.7; Sodium 133  Recent Lipid Panel No results found for: CHOL, TRIG, HDL, CHOLHDL, VLDL, LDLCALC, LDLDIRECT  Physical Exam:    VS:  BP 130/82   Pulse 78   Ht 5' 6 (1.676 m)   Wt 247 lb 9.6 oz (112.3 kg)   SpO2 98%   BMI 39.96 kg/m     Wt Readings from Last 3 Encounters:  02/07/24 247 lb 9.6 oz (112.3 kg)  03/16/23 250 lb (113.4 kg)  03/01/23 250 lb (113.4 kg)     GEN: Patient is in no acute distress HEENT: Normal NECK: No JVD; No carotid bruits LYMPHATICS: No lymphadenopathy CARDIAC: Hear sounds regular, 2/6 systolic murmur at the apex. RESPIRATORY:  Clear to auscultation without rales, wheezing or rhonchi  ABDOMEN: Soft, non-tender, non-distended MUSCULOSKELETAL:  No edema; No deformity  SKIN: Warm and dry NEUROLOGIC:  Alert and oriented x 3 PSYCHIATRIC:  Normal affect   Signed, Backers JONELLE Edwyna, MD  02/07/2024 2:14 PM    [facility]

## 2024-02-13 DIAGNOSIS — H47233 Glaucomatous optic atrophy, bilateral: Secondary | ICD-10-CM | POA: Diagnosis not present

## 2024-02-13 DIAGNOSIS — H401131 Primary open-angle glaucoma, bilateral, mild stage: Secondary | ICD-10-CM | POA: Diagnosis not present

## 2024-02-13 DIAGNOSIS — H52223 Regular astigmatism, bilateral: Secondary | ICD-10-CM | POA: Diagnosis not present

## 2024-02-13 DIAGNOSIS — H5231 Anisometropia: Secondary | ICD-10-CM | POA: Diagnosis not present

## 2024-02-13 DIAGNOSIS — Z961 Presence of intraocular lens: Secondary | ICD-10-CM | POA: Diagnosis not present

## 2024-02-13 DIAGNOSIS — H524 Presbyopia: Secondary | ICD-10-CM | POA: Diagnosis not present

## 2024-02-13 DIAGNOSIS — Z9842 Cataract extraction status, left eye: Secondary | ICD-10-CM | POA: Diagnosis not present

## 2024-02-13 DIAGNOSIS — Z9841 Cataract extraction status, right eye: Secondary | ICD-10-CM | POA: Diagnosis not present

## 2024-03-13 DIAGNOSIS — M1711 Unilateral primary osteoarthritis, right knee: Secondary | ICD-10-CM | POA: Diagnosis not present

## 2024-03-13 DIAGNOSIS — M1712 Unilateral primary osteoarthritis, left knee: Secondary | ICD-10-CM | POA: Diagnosis not present

## 2024-03-13 DIAGNOSIS — M170 Bilateral primary osteoarthritis of knee: Secondary | ICD-10-CM | POA: Diagnosis not present

## 2024-03-27 DIAGNOSIS — D485 Neoplasm of uncertain behavior of skin: Secondary | ICD-10-CM | POA: Diagnosis not present

## 2024-03-27 DIAGNOSIS — L820 Inflamed seborrheic keratosis: Secondary | ICD-10-CM | POA: Diagnosis not present

## 2024-04-05 DIAGNOSIS — M48061 Spinal stenosis, lumbar region without neurogenic claudication: Secondary | ICD-10-CM | POA: Diagnosis not present

## 2024-04-05 DIAGNOSIS — M47816 Spondylosis without myelopathy or radiculopathy, lumbar region: Secondary | ICD-10-CM | POA: Diagnosis not present

## 2024-04-17 DIAGNOSIS — M47896 Other spondylosis, lumbar region: Secondary | ICD-10-CM | POA: Diagnosis not present

## 2024-04-24 DIAGNOSIS — R600 Localized edema: Secondary | ICD-10-CM | POA: Diagnosis not present

## 2024-04-24 DIAGNOSIS — L97911 Non-pressure chronic ulcer of unspecified part of right lower leg limited to breakdown of skin: Secondary | ICD-10-CM | POA: Diagnosis not present

## 2024-04-25 DIAGNOSIS — G2581 Restless legs syndrome: Secondary | ICD-10-CM | POA: Diagnosis not present

## 2024-04-25 DIAGNOSIS — I10 Essential (primary) hypertension: Secondary | ICD-10-CM | POA: Diagnosis not present

## 2024-04-25 DIAGNOSIS — M5136 Other intervertebral disc degeneration, lumbar region with discogenic back pain only: Secondary | ICD-10-CM | POA: Diagnosis not present

## 2024-04-25 DIAGNOSIS — F331 Major depressive disorder, recurrent, moderate: Secondary | ICD-10-CM | POA: Diagnosis not present

## 2024-04-25 DIAGNOSIS — R799 Abnormal finding of blood chemistry, unspecified: Secondary | ICD-10-CM | POA: Diagnosis not present

## 2024-04-25 DIAGNOSIS — R5381 Other malaise: Secondary | ICD-10-CM | POA: Diagnosis not present

## 2024-04-25 DIAGNOSIS — E039 Hypothyroidism, unspecified: Secondary | ICD-10-CM | POA: Diagnosis not present

## 2024-04-25 DIAGNOSIS — E782 Mixed hyperlipidemia: Secondary | ICD-10-CM | POA: Diagnosis not present

## 2024-04-25 DIAGNOSIS — D649 Anemia, unspecified: Secondary | ICD-10-CM | POA: Diagnosis not present

## 2024-04-25 DIAGNOSIS — K219 Gastro-esophageal reflux disease without esophagitis: Secondary | ICD-10-CM | POA: Diagnosis not present

## 2024-04-25 DIAGNOSIS — D509 Iron deficiency anemia, unspecified: Secondary | ICD-10-CM | POA: Diagnosis not present

## 2024-04-25 DIAGNOSIS — R5383 Other fatigue: Secondary | ICD-10-CM | POA: Diagnosis not present

## 2024-05-10 ENCOUNTER — Encounter: Payer: Self-pay | Admitting: Gastroenterology

## 2024-05-11 DIAGNOSIS — M1711 Unilateral primary osteoarthritis, right knee: Secondary | ICD-10-CM | POA: Diagnosis not present

## 2024-05-11 DIAGNOSIS — M7061 Trochanteric bursitis, right hip: Secondary | ICD-10-CM | POA: Diagnosis not present

## 2024-05-18 DIAGNOSIS — H401131 Primary open-angle glaucoma, bilateral, mild stage: Secondary | ICD-10-CM | POA: Diagnosis not present

## 2024-05-18 DIAGNOSIS — H47233 Glaucomatous optic atrophy, bilateral: Secondary | ICD-10-CM | POA: Diagnosis not present

## 2024-05-18 DIAGNOSIS — H4010X1 Unspecified open-angle glaucoma, mild stage: Secondary | ICD-10-CM | POA: Diagnosis not present

## 2024-05-25 DIAGNOSIS — M1712 Unilateral primary osteoarthritis, left knee: Secondary | ICD-10-CM | POA: Diagnosis not present

## 2024-06-18 DIAGNOSIS — D649 Anemia, unspecified: Secondary | ICD-10-CM | POA: Diagnosis not present

## 2024-06-18 DIAGNOSIS — Z79899 Other long term (current) drug therapy: Secondary | ICD-10-CM | POA: Diagnosis not present

## 2024-06-18 DIAGNOSIS — J45909 Unspecified asthma, uncomplicated: Secondary | ICD-10-CM | POA: Diagnosis not present

## 2024-06-18 DIAGNOSIS — R0600 Dyspnea, unspecified: Secondary | ICD-10-CM | POA: Diagnosis not present

## 2024-06-28 ENCOUNTER — Ambulatory Visit: Admitting: Gastroenterology

## 2024-06-28 ENCOUNTER — Ambulatory Visit: Payer: Self-pay | Admitting: Gastroenterology

## 2024-06-28 ENCOUNTER — Other Ambulatory Visit

## 2024-06-28 ENCOUNTER — Encounter: Payer: Self-pay | Admitting: Gastroenterology

## 2024-06-28 ENCOUNTER — Other Ambulatory Visit: Payer: Self-pay

## 2024-06-28 VITALS — BP 138/72 | HR 85 | Ht 66.0 in | Wt 257.1 lb

## 2024-06-28 DIAGNOSIS — K219 Gastro-esophageal reflux disease without esophagitis: Secondary | ICD-10-CM

## 2024-06-28 DIAGNOSIS — R195 Other fecal abnormalities: Secondary | ICD-10-CM

## 2024-06-28 DIAGNOSIS — D509 Iron deficiency anemia, unspecified: Secondary | ICD-10-CM

## 2024-06-28 DIAGNOSIS — K5909 Other constipation: Secondary | ICD-10-CM

## 2024-06-28 DIAGNOSIS — R5383 Other fatigue: Secondary | ICD-10-CM

## 2024-06-28 DIAGNOSIS — K648 Other hemorrhoids: Secondary | ICD-10-CM

## 2024-06-28 DIAGNOSIS — Z860100 Personal history of colon polyps, unspecified: Secondary | ICD-10-CM | POA: Diagnosis not present

## 2024-06-28 DIAGNOSIS — K625 Hemorrhage of anus and rectum: Secondary | ICD-10-CM

## 2024-06-28 LAB — CBC WITH DIFFERENTIAL/PLATELET
Basophils Absolute: 0.1 K/uL (ref 0.0–0.1)
Basophils Relative: 1 % (ref 0.0–3.0)
Eosinophils Absolute: 0.1 K/uL (ref 0.0–0.7)
Eosinophils Relative: 0.7 % (ref 0.0–5.0)
HCT: 27.8 % — ABNORMAL LOW (ref 36.0–46.0)
Hemoglobin: 8.5 g/dL — ABNORMAL LOW (ref 12.0–15.0)
Lymphocytes Relative: 14 % (ref 12.0–46.0)
Lymphs Abs: 1.4 K/uL (ref 0.7–4.0)
MCHC: 30.6 g/dL (ref 30.0–36.0)
MCV: 68.5 fl — ABNORMAL LOW (ref 78.0–100.0)
Monocytes Absolute: 0.7 K/uL (ref 0.1–1.0)
Monocytes Relative: 7.3 % (ref 3.0–12.0)
Neutro Abs: 7.7 K/uL (ref 1.4–7.7)
Neutrophils Relative %: 77 % (ref 43.0–77.0)
Platelets: 426 K/uL — ABNORMAL HIGH (ref 150.0–400.0)
RBC: 4.05 Mil/uL (ref 3.87–5.11)
RDW: 21.2 % — ABNORMAL HIGH (ref 11.5–15.5)
WBC: 10 K/uL (ref 4.0–10.5)

## 2024-06-28 LAB — IBC + FERRITIN
Ferritin: 7.3 ng/mL — ABNORMAL LOW (ref 10.0–291.0)
Iron: 20 ug/dL — ABNORMAL LOW (ref 42–145)
Saturation Ratios: 3.8 % — ABNORMAL LOW (ref 20.0–50.0)
TIBC: 520.8 ug/dL — ABNORMAL HIGH (ref 250.0–450.0)
Transferrin: 372 mg/dL — ABNORMAL HIGH (ref 212.0–360.0)

## 2024-06-28 LAB — B12 AND FOLATE PANEL
Folate: 10.5 ng/mL (ref >5.9)
Vitamin B-12: 191 pg/mL — ABNORMAL LOW (ref 211–911)

## 2024-06-28 MED ORDER — OMEPRAZOLE 20 MG PO CPDR: 20.0000 mg | DELAYED_RELEASE_CAPSULE | Freq: Every day | ORAL | 1 refills | Status: DC

## 2024-06-28 MED ORDER — OMEPRAZOLE 20 MG PO CPDR: 20.0000 mg | DELAYED_RELEASE_CAPSULE | Freq: Two times a day (BID) | ORAL | 1 refills | Status: DC

## 2024-06-28 NOTE — Patient Instructions (Addendum)
 Iron  deficiency anemia Increase PPI therapy BID , take 1 capsule 30-45 mins before breakfast and dinner No NSAID's (motrin, advil, goody powders)  Constipation Start OTC metamucil 1 tsp po daily Recommend High fiber diet Can use OTC miralax  po daily if needed  Hemorrhoids No straining or pushing  No prolonged sitting   Your provider has requested that you go to the basement level for lab work before leaving today. Press B on the elevator. The lab is located at the first door on the left as you exit the elevator.  You have been scheduled for an endoscopy. Please follow written instructions given to you at your visit today.  If you use inhalers (even only as needed), please bring them with you on the day of your procedure.  If you take any of the following medications, they will need to be adjusted prior to your procedure:   DO NOT TAKE 7 DAYS PRIOR TO TEST- Trulicity (dulaglutide) Ozempic , Wegovy  (semaglutide ) Mounjaro, Zepbound (tirzepatide) Bydureon Bcise (exanatide extended release)  DO NOT TAKE 1 DAY PRIOR TO YOUR TEST Rybelsus  (semaglutide ) Adlyxin (lixisenatide) Victoza (liraglutide) Byetta (exanatide) ___________________________________________________________________________  Due to recent changes in healthcare laws, you may see the results of your imaging and laboratory studies on MyChart before your provider has had a chance to review them.  We understand that in some cases there may be results that are confusing or concerning to you. Not all laboratory results come back in the same time frame and the provider may be waiting for multiple results in order to interpret others.  Please give us  48 hours in order for your provider to thoroughly review all the results before contacting the office for clarification of your results.   _______________________________________________________  If your blood pressure at your visit was 140/90 or greater, please contact your  primary care physician to follow up on this.  _______________________________________________________  If you are age 76 or older, your body mass index should be between 23-30. Your Body mass index is 41.5 kg/m. If this is out of the aforementioned range listed, please consider follow up with your Primary Care Provider.  If you are age 76 or younger, your body mass index should be between 19-25. Your Body mass index is 41.5 kg/m. If this is out of the aformentioned range listed, please consider follow up with your Primary Care Provider.   ________________________________________________________  The [facility] providers would like to encourage you to use MYCHART to communicate with providers for non-urgent requests or questions.  Due to long hold times on the telephone, sending your provider a message by [employer] may be a faster and more efficient way to get a response.  Please allow 48 business hours for a response.  Please remember that this is for non-urgent requests.  _______________________________________________________  [facility] is using a team-based approach to care.  Your team is made up of your doctor and two to three APPS. Our APPS (Nurse Practitioners and Physician Assistants) work with your physician to ensure care continuity for you. They are fully qualified to address your health concerns and develop a treatment plan. They communicate directly with your gastroenterologist to care for you. Seeing the Advanced Practice Practitioners on your physician's team can help you by facilitating care more promptly, often allowing for earlier appointments, access to diagnostic testing, procedures, and other specialty referrals.   Thank you for trusting me with your gastrointestinal care. Deanna May, FNP-C

## 2024-06-28 NOTE — Progress Notes (Signed)
 Chief Complaint:GERD with esophagitis  Primary GI Doctor: Dr. Charlanne   HPI:  Patient is a  78  year old female patient with past medical history of hypertension, morid obesity, hyperlipidemia, iron  deficiency, who was referred to me by Benson Eleanor PARAS* on 05/09/24 for a evaluation of GERD with esophagitis  .    02/07/24 seen by cardiology for follow-up reviewed entire note.   Interval History  Patient presents for evaluation of iron  deficiency anemia, GERD, and fatigue.  Patient reports back in October she was diagnosed with iron  deficiency anemia.  Patient not currently on any oral iron .  Patient denies any abnormal vaginal bleeding. No hematemesis.  Patient does not donate blood.  Patient is not vegan and consumes meat products.  Patient does note she had bright red blood per rectum about 2 months ago that was intermittent over the course of a week after her bowel movement.  Patient notes bright red blood was on the toilet paper with wiping.  Patient notes she does have history of hemorrhoids and does strain at times.  Patient has history of issues with constipation but not currently taking anything.  Patient states she will have a bowel movement every 1 to 2 days. She does alternate between OTC tylenol  and Advil , she takes at least twice a week. She notes when she takes Advil she takes 3 capsules.   Patient has history of GERD and currently taking Omeprazole 20 mg po daily. If she skips a dose she has symptoms of GERD. No dysphagia. No nausea or vomiting. Good appetite. Denies abdominal pain.  Never had EGD. She cannot recall for sure when she had her last colonoscopy, but states it Hilberto Burzynski have been last Libni Fusaro. She has had two colonoscopies.  She reports the first when she had large polyps that were benign.  The second colonoscopy she reports she had a couple small benign polyps.  Also noted internal hemorrhoids.  Her husband was a transport planner in [place]. She use to see Dr. Towana  until he retired.   Drinks a few glasses of wine, almost daily.   Patient's family history includes: prostate CA in father  Wt Readings from Last 3 Encounters:  06/28/24 257 lb 2 oz (116.6 kg)  02/07/24 247 lb 9.6 oz (112.3 kg)  03/16/23 250 lb (113.4 kg)    Past Medical History:  Diagnosis Date   Anemia    Atrophic vaginitis 12/26/2019   Formatting of this note might be different from the original. Follows along with urology uses bioidentical hormone   Benign essential hypertension 10/08/2015   Chest pain in adult 07/02/2015   Formatting of this note might be different from the original. 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 L3/4 7.2 mm with L4/5 mod to severe spinal stenosis, L5/S1 with slight disc on the right Formatting of this note might be different from the original. 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     buPROPion (WELLBUTRIN XL) 150 MG 24 hr tablet 1 tablet in the morning Orally Once a day; Duration: 90 days     clonazePAM (KLONOPIN) 0.5 MG tablet Take 1 mg by mouth at bedtime.     EPINEPHrine  0.3 mg/0.3 mL IJ SOAJ injection Inject 0.3 mg into the muscle as needed for anaphylaxis.     FLUoxetine  (PROZAC ) 20 MG capsule Take 20 mg by mouth daily.     furosemide  (LASIX ) 40 MG tablet Take 40 mg by mouth daily as needed for edema.     latanoprost  (XALATAN ) 0.005 % ophthalmic solution Place 1 drop into both eyes at bedtime.     levothyroxine  (SYNTHROID ) 88 MCG tablet Take 88 mcg by mouth daily before breakfast.     oxyCODONE  (OXY IR/ROXICODONE ) 5 MG immediate release tablet Take 1-2 tablets (5-10 mg total) by mouth every 4 (four) hours as needed for moderate pain (pain score 4-6). 30 tablet 0   pramipexole  (MIRAPEX ) 1 MG tablet Take 2 mg by mouth 3 (three) times daily.     SUMAtriptan  (IMITREX ) 100 MG tablet Take 100 mg by mouth every 2 (two) hours as needed for migraine. Rosalina Dingwall repeat in 2  hours if headache persists or recurs.     azithromycin (ZITHROMAX) 500 MG tablet Oral; Duration: 3 Days     glycopyrrolate  (ROBINUL ) 1 MG tablet Take 1 tablet (1 mg total) by mouth 2 (two) times daily as needed. Oral; Duration: 90 Days     omeprazole  (PRILOSEC) 20 MG capsule Take 1 capsule (20 mg total) by mouth 2 (two) times daily before a meal. 90 capsule 1   No current facility-administered medications for this visit.    Allergies as of 06/28/2024 - Review Complete 02/07/2024  Allergen Reaction Noted   Bee venom Anaphylaxis 07/09/2014   Feldene [piroxicam]  11/27/2013   Flexeril [cyclobenzaprine] Nausea Only  11/27/2013   Naphazoline  12/25/2022   Cephalosporins Rash 07/09/2014   Keflex [cephalexin] Rash 10/08/2015    Family History  Problem Relation Age of Onset   Cancer - Prostate Father     Review of Systems:    Constitutional: No weight loss, fever, chills, weakness or fatigue HEENT: Eyes: No change in vision               Ears, Nose, Throat:  No change in hearing or congestion Skin: No rash or itching Cardiovascular: No chest pain, chest pressure or palpitations   Respiratory: No SOB or cough Gastrointestinal: See HPI and otherwise negative Genitourinary: No dysuria or change in urinary frequency Neurological: No headache, dizziness or syncope Musculoskeletal: No new muscle or joint pain Hematologic: No bleeding or bruising Psychiatric: No history of depression or anxiety    Physical Exam:  Vital signs: BP 138/72   Pulse 85   Ht 5' 6 (1.676 m)   Wt 257 lb 2 oz (116.6 kg)   BMI 41.50 kg/m   Constitutional:   Pleasant  female appears to be in NAD, Well developed, Well nourished, alert and cooperative Eyes:   PEERL, EOMI. No icterus. Conjunctiva pink. Neck:  Supple Throat: Oral cavity and pharynx without inflammation, swelling or lesion.  Respiratory: Respirations even and unlabored. Lungs clear to auscultation bilaterally.   No wheezes, crackles, or rhonchi.   Cardiovascular: Normal S1, S2. Regular rate and rhythm. No cyanosis or pallor. BLE edema noted. Gastrointestinal:  Soft, nondistended, nontender. No rebound or guarding. Hypoactive bowel sounds. No appreciable masses or hepatomegaly. Rectal:  Not performed.  Msk:  Symmetrical without gross deformities. Uses cane.  Neurologic:  Alert and  oriented x4;  grossly normal neurologically.  Skin:   Dry and intact without significant lesions or rashes.  RELEVANT LABS AND IMAGING: CBC    Latest Ref Rng & Units 03/21/2023    4:00 AM 03/20/2023    3:57 AM 03/19/2023    3:33 AM  CBC  WBC 4.0 - 10.5 K/uL 10.3  7.2  7.8   Hemoglobin 12.0 - 15.0 g/dL 8.3  9.2  9.0   Hematocrit 36.0 - 46.0 % 27.4  29.8  28.8   Platelets 150 - 400 K/uL 352  353  311      CMP     Latest Ref Rng & Units 03/21/2023    4:00 AM 03/20/2023    3:57 AM 03/19/2023    3:33 AM  CMP  Glucose 70 - 99 mg/dL 819  833  892   BUN 8 - 23 mg/dL 14  13  12    Creatinine 0.44 - 1.00 mg/dL 9.50  9.51  9.50   Sodium 135 - 145 mmol/L 133  134  138   Potassium 3.5 - 5.1 mmol/L 3.7  3.7  3.5   Chloride 98 - 111 mmol/L 100  100  102   CO2 22 - 32 mmol/L 26  26  29    Calcium 8.9 - 10.3 mg/dL 8.1  8.3  8.5    07/7973 echo-Left EF 55-60% 05/04/24 fecal occult positive   Assessment: Encounter Diagnoses  Name Primary?   Gastroesophageal reflux disease, unspecified whether esophagitis present Yes   Iron  deficiency anemia, unspecified iron  deficiency anemia type    Positive fecal occult blood test    Other fatigue    Chronic constipation    Internal hemorrhoids    BRBPR (bright red blood per rectum)    History of colonic polyps  78 year old female patient that presents for evaluation of iron  deficiency anemia, positive fecal occult and complains of fatigue.  Her hemoglobin has trended downward from August 2024, she notes this was around the time she had hip surgery.  At that time her hemoglobin had dropped from 13.1 in July to 10.6.   Most recent hemoglobin from October was 9.7.  Iron  17, ferritin 9. Pt is not on any oral iron  supplementation. Will recheck her levels today. She did note a week of BRBPR back in October she contributes to her hemorrhoids and constipation. She is currently not taking any medication for this. Will have her start a fiber supplement. She reports she had colonoscopy last summer with benign polyps. Will request records. She does endorse using NSAID's few times a week for arthritis which increases her risk of PUD. Will go ahead and proceed with upper GI endoscopy in [facility] with Dr. Charlanne to rule out esophagitis, gastritis, and/or PUD. Will increase her PPI therapy to BID. Advise no NSAIDs.   #1 Iron  deficiency anemia #2 Positive hemoccult #3 Fatigue #4 Internal hemorrhoids #5 BRBPR, resolved #6 Constipation  #7 hx of colonic poylps -CBC, iron  panel/TIBC, B 12, folate -Increase PPI therapy BID  -start Metamucil 1 tsp po daily -No NSAIDs -Scheduled EGD in [facility] with Dr. Charlanne. The risks and benefits of EGD with possible biopsies and esophageal dilation were discussed with the patient who agrees to proceed. -request procedures from Dr. Towana  -Calden Dorsey consider IV iron  vs oral iron  given her issues with constipation and hemorrhoids  Thank you for the courtesy of this consult. Please call me with any questions or concerns.   Kinslei Labine, FNP-C [facility] 06/28/2024, 1:29 PM  Cc: Brown-Patram, Melissa J*

## 2024-06-29 ENCOUNTER — Ambulatory Visit: Admitting: Gastroenterology

## 2024-06-29 ENCOUNTER — Encounter: Payer: Self-pay | Admitting: Gastroenterology

## 2024-06-29 VITALS — BP 99/52 | HR 85 | Temp 97.4°F | Resp 15 | Ht 66.0 in | Wt 257.2 lb

## 2024-06-29 DIAGNOSIS — G2581 Restless legs syndrome: Secondary | ICD-10-CM | POA: Diagnosis not present

## 2024-06-29 DIAGNOSIS — R195 Other fecal abnormalities: Secondary | ICD-10-CM | POA: Diagnosis not present

## 2024-06-29 DIAGNOSIS — G4730 Sleep apnea, unspecified: Secondary | ICD-10-CM | POA: Diagnosis not present

## 2024-06-29 DIAGNOSIS — K219 Gastro-esophageal reflux disease without esophagitis: Secondary | ICD-10-CM

## 2024-06-29 DIAGNOSIS — F329 Major depressive disorder, single episode, unspecified: Secondary | ICD-10-CM | POA: Diagnosis not present

## 2024-06-29 DIAGNOSIS — D509 Iron deficiency anemia, unspecified: Secondary | ICD-10-CM

## 2024-06-29 DIAGNOSIS — I10 Essential (primary) hypertension: Secondary | ICD-10-CM | POA: Diagnosis not present

## 2024-06-29 DIAGNOSIS — K2950 Unspecified chronic gastritis without bleeding: Secondary | ICD-10-CM | POA: Diagnosis not present

## 2024-06-29 MED ORDER — PANTOPRAZOLE SODIUM 40 MG PO TBEC: 40.0000 mg | DELAYED_RELEASE_TABLET | Freq: Two times a day (BID) | ORAL | 4 refills | Status: AC

## 2024-06-29 MED ORDER — SUCRALFATE 1 GM/10ML PO SUSP: ORAL | 0 refills | Status: DC

## 2024-06-29 MED ORDER — SODIUM CHLORIDE 0.9 % IV SOLN: 500.0000 mL | Freq: Once | INTRAVENOUS | Status: AC

## 2024-06-29 NOTE — Op Note (Signed)
  [facility] Patient Name: Kendra [name] Procedure Date: 06/29/2024 8:35 AM MRN: [id number] Endoscopist: Lynnie Bring , MD, [id number] Age: 78 Referring MD:  Date of Birth: [birth year] Gender: Female Account #: [id number] Procedure:                Upper GI endoscopy Indications:              Iron  deficiency anemia, Heartburn Medicines:                Monitored Anesthesia Care Procedure:                Pre-Anesthesia Assessment:                           - Prior to the procedure, a History and Physical                            was performed, and patient medications and                            allergies were reviewed. The patient's tolerance of                            previous anesthesia was also reviewed. The risks                            and benefits of the procedure and the sedation                            options and risks were discussed with the patient.                            All questions were answered, and informed consent                            was obtained. Prior Anticoagulants: The patient has                            taken no anticoagulant or antiplatelet agents. ASA                            Grade Assessment: III - A patient with severe                            systemic disease. After reviewing the risks and                            benefits, the patient was deemed in satisfactory                            condition to undergo the procedure.                           After obtaining informed consent, the endoscope was  passed under direct vision. Throughout the                            procedure, the patient's blood pressure, pulse, and                            oxygen saturations were monitored continuously. The                            Olympus Scope SN [id number] was introduced through the                            mouth, and advanced to the second part of duodenum.                            The upper GI  endoscopy was accomplished without                            difficulty. The patient tolerated the procedure                            well. Scope In: Scope Out: Findings:                 The distal esophagus was moderately tortuous and                            foreshortened d/t large HH.                           A 7 cm hiatal hernia was present extending from 33                            cm up to 40 cm (GE junction up to diaphragmatic                            hiatus). 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                           Four non-bleeding linear Cameron gastric ulcers                            with no stigmata of bleeding were found in the                            stomach. The largest lesion was 10 mm in largest                            dimension. Biopsies were taken with a cold forceps  for histology.                           Localized mild inflammation characterized by                            congestion (edema) and erythema was found in the                            gastric antrum. Biopsies were taken with a cold                            forceps for histology.                           The examined duodenum was normal. Biopsies for                            histology were taken with a cold forceps for                            evaluation of celiac disease. Complications:            No immediate complications. Estimated Blood Loss:     Estimated blood loss: none. Impression:               - Large hiatal hernia.                           GLENWOOD Smalls gastric ulcers with no stigmata of                            bleeding. Biopsied. -Likely etiology of IDA. Rule                            out other causes.                           - Gastritis. Biopsied.                           - Normal examined duodenum. Biopsied. Recommendation:           - Patient has a  contact number available for                            emergencies. The signs and symptoms of potential                            delayed complications were discussed with the                            patient. Return to normal activities tomorrow.                            Written discharge instructions were provided to the  patient.                           - Resume previous diet.                           - Change omeprazole  to Protonix  40 mg p.o. twice                            daily for now #60, 4RF                           - Carafate  elixir 1 g p.o. 4 times daily x 2 weeks.                           - Await pathology results.                           - No aspirin , ibuprofen, naproxen, or other                            non-steroidal anti-inflammatory drugs. Can use                            Tylenol  on as-needed basis.                           - Proceed with CT Abdo/pelvis with contrast for                            further evaluation.                           - Repeat EGD in 12 weeks to ensure healing.                           - Follow-up in GI clinic thereafter.                           - The findings and recommendations were discussed                            with the patient. Lynnie Bring, MD 06/29/2024 9:01:03 AM This report has been signed electronically.

## 2024-06-29 NOTE — Patient Instructions (Addendum)
 YOU HAD AN ENDOSCOPIC PROCEDURE TODAY AT THE [facility]:   Refer to the procedure report that was given to you for any specific questions about what was found during the examination.  If the procedure report does not answer your questions, please call your gastroenterologist to clarify.  If you requested that your care partner not be given the details of your procedure findings, then the procedure report has been included in a sealed envelope for you to review at your convenience later.  YOU SHOULD EXPECT: Some feelings of bloating in the abdomen. Passage of more gas than usual.  Walking can help get rid of the air that was put into your GI tract during the procedure and reduce the bloating. If you had a lower endoscopy (such as a colonoscopy or flexible sigmoidoscopy) you may notice spotting of blood in your stool or on the toilet paper. If you underwent a bowel prep for your procedure, you may not have a normal bowel movement for a few days.  Please Note:  You might notice some irritation and congestion in your nose or some drainage.  This is from the oxygen used during your procedure.  There is no need for concern and it should clear up in a day or so.  SYMPTOMS TO REPORT IMMEDIATELY:   Following upper endoscopy (EGD)  Vomiting of blood or coffee ground material  New chest pain or pain under the shoulder blades  Painful or persistently difficult swallowing  New shortness of breath  Fever of 100F or higher  Black, tarry-looking stools  For urgent or emergent issues, a gastroenterologist can be reached at any hour by calling [phone]. Do not use MyChart messaging for urgent concerns.    DIET:  We do recommend a small meal at first, but then you may proceed to your regular diet.  Drink plenty of fluids but you should avoid alcoholic beverages for 24 hours.  MEDICATIONS: Change Omeprazole  to Protonix  40 mg by mouth TWICE daily for now (#60, 4 refills). Carafate  elixir 1  gram by mouth 4 times daily for 2 weeks. No Aspirin , Ibuprofen, Naproxen, or other nonsteroidal anti-inflammatory drugs. Can use Tylenol  on as-needed basis.   FOLLOW UP: Proceed with CT of abdo/pelvis with contrast for further evaluation. Repeat EGD in 12 weeks to ensure healing. Follow up in the GI clinic thereafter. Dr. Ira office nurse will call you to set up these appointments (EGD scheduled not out yet).  Educational handouts given to patient: Gastritis, Hiatal Hernia.  Thank you for allowing us  to provide for your healthcare needs today.  ACTIVITY:  You should plan to take it easy for the rest of today and you should NOT DRIVE or use heavy machinery until tomorrow (because of the sedation medicines used during the test).    FOLLOW UP: Our staff will call the number listed on your records the next business day following your procedure.  We will call around 7:15- 8:00 am to check on you and address any questions or concerns that you may have regarding the information given to you following your procedure. If we do not reach you, we will leave a message.     If any biopsies were taken you will be contacted by phone or by letter within the next 1-3 weeks.  Please call us  at [phone] if you have not heard about the biopsies in 3 weeks.    SIGNATURES/CONFIDENTIALITY: You and/or your care partner have signed paperwork which will be entered into  your electronic medical record.  These signatures attest to the fact that that the information above on your After Visit Summary has been reviewed and is understood.  Full responsibility of the confidentiality of this discharge information lies with you and/or your care-partner.

## 2024-06-29 NOTE — Progress Notes (Signed)
 Chief Complaint:GERD with esophagitis  Primary GI Doctor: Dr. Charlanne    HPI:  [name] is a  78  year old female [name] with past medical history of hypertension, morid obesity, hyperlipidemia, iron  deficiency, who was referred to me by Brown-Patram, Melissa J* on 05/09/24 for a evaluation of GERD with esophagitis  .     02/07/24 seen by cardiology for follow-up reviewed entire note.    Interval History   [name] presents for evaluation of iron  deficiency anemia, GERD, and fatigue.   [name] reports back in October she was diagnosed with iron  deficiency anemia.  [name] not currently on any oral iron .  [name] denies any abnormal vaginal bleeding. No hematemesis.  [name] does not donate blood.  [name] is not vegan and consumes meat products.  [name] does note she had bright red blood per rectum about 2 months ago that was intermittent over the course of a week after her bowel movement.  [name] notes bright red blood was on the toilet paper with wiping.  [name] notes she does have history of hemorrhoids and does strain at times.  [name] has history of issues with constipation but not currently taking anything.  [name] states she will have a bowel movement every 1 to 2 days. She does alternate between OTC tylenol  and Advil , she takes at least twice a week. She notes when she takes Advil she takes 3 capsules.    [name] has history of GERD and currently taking Omeprazole  20 mg po daily. If she skips a dose she has symptoms of GERD. No dysphagia. No nausea or vomiting. Good appetite. Denies abdominal pain.   Never had EGD. She cannot recall for sure when she had her last colonoscopy, but states it may have been last May. She has had two colonoscopies.  She reports the first when she had large polyps that were benign.  The second colonoscopy she reports she had a couple small benign polyps.  Also noted internal hemorrhoids.   Her husband was a transport planner in [place]. She use to see Dr.  Towana until he retired.    Drinks a few glasses of wine, almost daily.    [name]'s family history includes: prostate CA in father      Wt Readings from Last 3 Encounters:  06/28/24 257 lb 2 oz (116.6 kg)  02/07/24 247 lb 9.6 oz (112.3 kg)  03/16/23 250 lb (113.4 kg)        Past Medical History:  Diagnosis Date   Anemia     Atrophic vaginitis 12/26/2019    Formatting of this note might be different from the original. Follows along with urology uses bioidentical hormone   Benign essential hypertension 10/08/2015   Chest pain in adult 07/02/2015    Formatting of this note might be different from the original. 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 L3/4 7.2 mm with L4/5 mod to severe spinal stenosis, L5/S1 with slight disc on the right Formatting of this note might be different from the original. 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       buPROPion (WELLBUTRIN XL) 150 MG 24 hr tablet 1 tablet in the morning Orally Once a day; Duration: 90 days       clonazePAM (KLONOPIN) 0.5 MG tablet Take 1 mg by mouth at bedtime.       EPINEPHrine  0.3 mg/0.3 mL IJ SOAJ injection Inject 0.3 mg into the muscle as needed for anaphylaxis.       FLUoxetine  (PROZAC ) 20 MG capsule Take 20 mg by mouth daily.       furosemide  (LASIX ) 40 MG tablet Take 40 mg by mouth daily as needed for edema.       latanoprost  (XALATAN ) 0.005 % ophthalmic solution Place 1 drop into both eyes at bedtime.       levothyroxine  (SYNTHROID ) 88 MCG tablet Take 88 mcg by mouth daily before breakfast.       oxyCODONE  (OXY IR/ROXICODONE ) 5 MG immediate release tablet Take 1-2 tablets (5-10 mg total) by mouth every 4 (four) hours as needed for moderate pain (pain score 4-6). 30 tablet 0   pramipexole  (MIRAPEX ) 1 MG tablet Take 2 mg by mouth 3 (three) times daily.       SUMAtriptan   (IMITREX ) 100 MG tablet Take 100 mg by mouth every 2 (two) hours as needed for migraine. May repeat in 2 hours if headache persists or recurs.       azithromycin (ZITHROMAX) 500 MG tablet Oral; Duration: 3 Days  glycopyrrolate  (ROBINUL ) 1 MG tablet Take 1 tablet (1 mg total) by mouth 2 (two) times daily as needed. Oral; Duration: 90 Days       omeprazole  (PRILOSEC) 20 MG capsule Take 1 capsule (20 mg total) by mouth 2 (two) times daily before a meal. 90 capsule 1      No current facility-administered medications for this visit.             Allergies as of 06/28/2024 - Review Complete 02/07/2024  Allergen Reaction Noted   Bee venom Anaphylaxis 07/09/2014   Feldene [piroxicam]   11/27/2013   Flexeril [cyclobenzaprine] Nausea Only 11/27/2013   Naphazoline   12/25/2022   Cephalosporins Rash 07/09/2014   Keflex [cephalexin] Rash 10/08/2015           Family History  Problem Relation Age of Onset   Cancer - Prostate Father            Review of Systems:    Constitutional: No weight loss, fever, chills, weakness or fatigue HEENT: Eyes: No change in vision               Ears, Nose, Throat:  No change in hearing or congestion Skin: No rash or itching Cardiovascular: No chest pain, chest pressure or palpitations   Respiratory: No SOB or cough Gastrointestinal: See HPI and otherwise negative Genitourinary: No dysuria or change in urinary frequency Neurological: No headache, dizziness or syncope Musculoskeletal: No new muscle or joint pain Hematologic: No bleeding or bruising Psychiatric: No history of depression or anxiety      Physical Exam:  Vital signs: BP 138/72   Pulse 85   Ht 5' 6 (1.676 m)   Wt 257 lb 2 oz (116.6 kg)   BMI 41.50 kg/m    Constitutional:   Pleasant  female appears to be in NAD, Well developed, Well nourished, alert and cooperative Eyes:   PEERL, EOMI. No icterus. Conjunctiva pink. Neck:  Supple Throat: Oral cavity and pharynx without  inflammation, swelling or lesion.  Respiratory: Respirations even and unlabored. Lungs clear to auscultation bilaterally.   No wheezes, crackles, or rhonchi.  Cardiovascular: Normal S1, S2. Regular rate and rhythm. No cyanosis or pallor. BLE edema noted. Gastrointestinal:  Soft, nondistended, nontender. No rebound or guarding. Hypoactive bowel sounds. No appreciable masses or hepatomegaly. Rectal:  Not performed.  Msk:  Symmetrical without gross deformities. Uses cane.  Neurologic:  Alert and  oriented x4;  grossly normal neurologically.  Skin:   Dry and intact without significant lesions or rashes.   RELEVANT LABS AND IMAGING: CBC     Latest Ref Rng & Units 03/21/2023    4:00 AM 03/20/2023    3:57 AM 03/19/2023    3:33 AM  CBC  WBC 4.0 - 10.5 K/uL 10.3  7.2  7.8   Hemoglobin 12.0 - 15.0 g/dL 8.3  9.2  9.0   Hematocrit 36.0 - 46.0 % 27.4  29.8  28.8   Platelets 150 - 400 K/uL 352  353  311       CMP         Latest Ref Rng & Units 03/21/2023    4:00 AM 03/20/2023    3:57 AM 03/19/2023    3:33 AM  CMP  Glucose 70 - 99 mg/dL 819  833  892   BUN 8 - 23 mg/dL 14  13  12    Creatinine 0.44 - 1.00 mg/dL 9.50  9.51  9.50   Sodium 135 - 145 mmol/L 133  134  138   Potassium 3.5 - 5.1 mmol/L 3.7  3.7  3.5   Chloride 98 - 111 mmol/L 100  100  102   CO2 22 - 32 mmol/L 26  26  29    Calcium 8.9 - 10.3 mg/dL 8.1  8.3  8.5    07/7973 echo-Left EF 55-60% 05/04/24 fecal occult positive     Assessment:     Encounter Diagnoses  Name Primary?   Gastroesophageal reflux disease, unspecified whether esophagitis present Yes   Iron  deficiency anemia, unspecified iron  deficiency anemia type     Positive fecal occult blood test     Other fatigue     Chronic constipation     Internal hemorrhoids     BRBPR (bright red blood per rectum)     History of colonic polyps         78 year old female [name] that presents for evaluation of iron  deficiency anemia, positive fecal occult and complains of  fatigue.  Her hemoglobin has trended downward from August 2024, she notes this was around the time she had hip surgery.  At that time her hemoglobin had dropped from 13.1 in July to 10.6.  Most recent hemoglobin from October was 9.7.  Iron  17, ferritin 9. Pt is not on any oral iron  supplementation. Will recheck her levels today. She did note a week of BRBPR back in October she contributes to her hemorrhoids and constipation. She is currently not taking any medication for this. Will have her start a fiber supplement. She reports she had colonoscopy last summer with benign polyps. Will request records. She does endorse using NSAID's few times a week for arthritis which increases her risk of PUD. Will go ahead and proceed with upper GI endoscopy in [facility] with Dr. Charlanne to rule out esophagitis, gastritis, and/or PUD. Will increase her PPI therapy to BID. Advise no NSAIDs.    #1 Iron  deficiency anemia #2 Positive hemoccult #3 Fatigue #4 Internal hemorrhoids #5 BRBPR, resolved #6 Constipation  #7 hx of colonic poylps -CBC, iron  panel/TIBC, B 12, folate -Increase PPI therapy BID  -start Metamucil 1 tsp po daily -No NSAIDs -Scheduled EGD in [facility] with Dr. Charlanne. The risks and benefits of EGD with possible biopsies and esophageal dilation were discussed with the [name] who agrees to proceed. -request procedures from Dr. Towana  -may consider IV iron  vs oral iron  given her issues with constipation and hemorrhoids   Thank you for the courtesy of this consult. Please call me with any questions or concerns.    Deanna May, FNP-C    Attending physician's note   I have taken history, reviewed the chart and examined the [name]. I performed a substantive portion of this encounter, including complete performance of at least one of the key components, in conjunction with the APP. I agree with the Advanced Practitioner's note, impression and recommendations.   For   EGD today FU  with Deanna May  thereafter.   Anselm Charlanne, MD [facility] [phone]

## 2024-06-29 NOTE — Progress Notes (Signed)
 Called to room to assist during endoscopic procedure.  Patient ID and intended procedure confirmed with present staff. Received instructions for my participation in the procedure from the performing physician.

## 2024-06-29 NOTE — Progress Notes (Signed)
 Report to PACU, RN, vss, BBS= Clear.

## 2024-07-02 ENCOUNTER — Telehealth: Payer: Self-pay

## 2024-07-02 DIAGNOSIS — K5909 Other constipation: Secondary | ICD-10-CM

## 2024-07-02 DIAGNOSIS — K259 Gastric ulcer, unspecified as acute or chronic, without hemorrhage or perforation: Secondary | ICD-10-CM

## 2024-07-02 DIAGNOSIS — D509 Iron deficiency anemia, unspecified: Secondary | ICD-10-CM

## 2024-07-02 NOTE — Telephone Encounter (Signed)
 You have been scheduled for a CT scan of the abdomen and pelvis at [facility] (Address [address] [place], [place], [place] [address]. Urgent care side Desk 3 to check in)  You are scheduled on 07-05-24 at 430pm. You should arrive by 230pm your appointment time for registration. Please follow the written instructions below on the day of your exam:  WARNING: IF YOU ARE ALLERGIC TO IODINE /X-RAY DYE, PLEASE NOTIFY RADIOLOGY IMMEDIATELY AT [phone]! YOU WILL BE GIVEN A 13 HOUR PREMEDICATION PREP.  1) Do not eat after 230pm (2 hours prior to your test) 2) You will be given 2 bottles of oral contrast to drink on site.    Drink 1 bottle of contrast @ 230pm (2 hours prior to your exam)  Drink 1 bottle of contrast @ 330pm (1 hour prior to your exam)  You may take any medications as prescribed with a small amount of water , if necessary. If you take any of the following medications: METFORMIN, GLUCOPHAGE, GLUCOVANCE, AVANDAMET, RIOMET, FORTAMET, ACTOPLUS MET, JANUMET, GLUMETZA or METAGLIP, you MAY be asked to HOLD this medication 48 hours AFTER the exam.  The purpose of you drinking the oral contrast is to aid in the visualization of your intestinal tract. The contrast solution may cause some diarrhea. Depending on your individual set of symptoms, you may also receive an intravenous injection of x-ray contrast/dye. Plan on being there for 2 hours and 15 minutes, depending on the type of exam you are having performed.  This test typically takes 30-45 minutes to complete.  If you have any questions regarding your exam or if you need to reschedule, you may call the CT department at [phone] between the hours of 8:00 am and 5:00 pm, Monday-Friday.

## 2024-07-02 NOTE — Telephone Encounter (Signed)
  Follow up Call-     06/29/2024    7:15 AM  Call back number  Post procedure Call Back phone  # [phone]  Permission to leave phone message Yes     Patient questions:  Do you have a fever, pain , or abdominal swelling? No. Pain Score  0 *  Have you tolerated food without any problems? Yes.    Have you been able to return to your normal activities? Yes.    Do you have any questions about your discharge instructions: Diet   No. Medications  No. Follow up visit  No.  Do you have questions or concerns about your Care? No.  Actions: * If pain score is 4 or above: No action needed, pain <4.

## 2024-07-03 LAB — SURGICAL PATHOLOGY

## 2024-07-05 ENCOUNTER — Other Ambulatory Visit: Payer: Self-pay

## 2024-07-05 ENCOUNTER — Ambulatory Visit (INDEPENDENT_AMBULATORY_CARE_PROVIDER_SITE_OTHER)
Admission: RE | Admit: 2024-07-05 | Discharge: 2024-07-05 | Disposition: A | Discharge status: Home / Self Care | Source: Ambulatory Visit | Attending: Gastroenterology | Admitting: Gastroenterology

## 2024-07-05 DIAGNOSIS — K449 Diaphragmatic hernia without obstruction or gangrene: Secondary | ICD-10-CM | POA: Diagnosis not present

## 2024-07-05 DIAGNOSIS — K5730 Diverticulosis of large intestine without perforation or abscess without bleeding: Secondary | ICD-10-CM | POA: Diagnosis not present

## 2024-07-05 DIAGNOSIS — K5909 Other constipation: Secondary | ICD-10-CM | POA: Diagnosis not present

## 2024-07-05 DIAGNOSIS — K259 Gastric ulcer, unspecified as acute or chronic, without hemorrhage or perforation: Secondary | ICD-10-CM

## 2024-07-05 DIAGNOSIS — D509 Iron deficiency anemia, unspecified: Secondary | ICD-10-CM

## 2024-07-05 DIAGNOSIS — K5900 Constipation, unspecified: Secondary | ICD-10-CM | POA: Diagnosis not present

## 2024-07-05 MED ORDER — IOHEXOL 300 MG/ML  SOLN
100.0000 mL | Freq: Once | INTRAMUSCULAR | Status: AC | PRN
  Administered 2024-07-05: 100 mL via INTRAVENOUS

## 2024-07-07 ENCOUNTER — Ambulatory Visit: Payer: Self-pay | Admitting: Gastroenterology

## 2024-07-08 ENCOUNTER — Ambulatory Visit (HOSPITAL_BASED_OUTPATIENT_CLINIC_OR_DEPARTMENT_OTHER)
Admission: EM | Admit: 2024-07-08 | Discharge: 2024-07-08 | Disposition: A | Discharge status: Home / Self Care | Attending: Family Medicine | Admitting: Family Medicine

## 2024-07-08 ENCOUNTER — Ambulatory Visit (HOSPITAL_BASED_OUTPATIENT_CLINIC_OR_DEPARTMENT_OTHER): Admit: 2024-07-08 | Discharge: 2024-07-08 | Disposition: A | Discharge status: Home / Self Care | Admitting: Radiology

## 2024-07-08 ENCOUNTER — Encounter (HOSPITAL_BASED_OUTPATIENT_CLINIC_OR_DEPARTMENT_OTHER): Payer: Self-pay

## 2024-07-08 DIAGNOSIS — R0902 Hypoxemia: Secondary | ICD-10-CM

## 2024-07-08 DIAGNOSIS — R051 Acute cough: Secondary | ICD-10-CM

## 2024-07-08 DIAGNOSIS — R509 Fever, unspecified: Secondary | ICD-10-CM

## 2024-07-08 DIAGNOSIS — R918 Other nonspecific abnormal finding of lung field: Secondary | ICD-10-CM | POA: Diagnosis not present

## 2024-07-08 DIAGNOSIS — R059 Cough, unspecified: Secondary | ICD-10-CM | POA: Diagnosis not present

## 2024-07-08 DIAGNOSIS — Z9682 Presence of neurostimulator: Secondary | ICD-10-CM | POA: Diagnosis not present

## 2024-07-08 DIAGNOSIS — R0602 Shortness of breath: Secondary | ICD-10-CM

## 2024-07-08 DIAGNOSIS — J189 Pneumonia, unspecified organism: Secondary | ICD-10-CM

## 2024-07-08 LAB — POCT INFLUENZA A/B
Influenza A, POC: NEGATIVE
Influenza B, POC: NEGATIVE

## 2024-07-08 MED ORDER — ALBUTEROL SULFATE HFA 108 (90 BASE) MCG/ACT IN AERS: 2.0000 | INHALATION_SPRAY | RESPIRATORY_TRACT | 0 refills | Status: DC | PRN

## 2024-07-08 MED ORDER — PROMETHAZINE-DM 6.25-15 MG/5ML PO SYRP: 5.0000 mL | ORAL_SOLUTION | Freq: Four times a day (QID) | ORAL | 0 refills | Status: DC | PRN

## 2024-07-08 MED ORDER — COMPACT SPACE CHAMBER DEVI: 0 refills | Status: AC

## 2024-07-08 MED ORDER — METHYLPREDNISOLONE ACETATE 80 MG/ML IJ SUSP
80.0000 mg | Freq: Once | INTRAMUSCULAR | Status: AC
  Administered 2024-07-08: 80 mg via INTRAMUSCULAR

## 2024-07-08 MED ORDER — ACETAMINOPHEN 325 MG PO TABS
650.0000 mg | ORAL_TABLET | Freq: Once | ORAL | Status: AC
  Administered 2024-07-08: 650 mg via ORAL

## 2024-07-08 MED ORDER — DOXYCYCLINE HYCLATE 100 MG PO CAPS: 100.0000 mg | ORAL_CAPSULE | Freq: Two times a day (BID) | ORAL | 0 refills | Status: DC

## 2024-07-08 MED ORDER — IPRATROPIUM-ALBUTEROL 0.5-2.5 (3) MG/3ML IN SOLN
3.0000 mL | Freq: Once | RESPIRATORY_TRACT | Status: AC
  Administered 2024-07-08: 3 mL via RESPIRATORY_TRACT

## 2024-07-08 NOTE — ED Provider Notes (Addendum)
 [name]    CSN: [id number] Arrival date & time: 07/08/24  1444      History   Chief Complaint No chief complaint on file.   HPI Kendra [name] is a 78 y.o. female.   78 year old female with complaint of shortness of breath, cough, nasal congestion.  Symptoms started last night (on 07/07/2024).  She was seen on 06/18/2024, at [facility] and had lots of testing and a chest x-ray and was told that everything was negative.  The chest x-ray report I reviewed online said she had some bibasilar atelectasis.  She did have a CT abdomen and pelvis on 07/05/2024.  The lower lungs were in that CT scan and were read as negative.  She denies fever, ear pain, nausea, vomiting, constipation, diarrhea.  She has a history of bronchitis.  Symptoms came on a really fast last night but she had similar symptoms 2 to 3 weeks ago.     Past Medical History:  Diagnosis Date   Anemia    Atrophic vaginitis 12/26/2019   Formatting of this note might be different from the original. Follows along with urology uses bioidentical hormone   Benign essential hypertension 10/08/2015   Chest pain in adult 07/02/2015   Formatting of this note might be different from the original. 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 L3/4 7.2 mm with L4/5 mod to severe spinal stenosis, L5/S1 with slight disc on the right Formatting of this note might be different from the original. Overview:  L3/4 7.2 mm with L4/5 mod to severe spinal stenosis, L5/S1 with slight disc on the right   Valvular heart disease 07/15/2021    Patient Active Problem List   Diagnosis Date Noted   Left hip pain 03/17/2023   S/P total left hip arthroplasty 03/01/2023   Dyspnea on exertion 08/03/2021   History of COVID-19 08/03/2021   Anemia 07/16/2021   Depression 07/16/2021   Degenerative disc disease, lumbar 07/16/2021   Hypertension 07/16/2021   Mixed hyperlipidemia 07/16/2021   Morbid obesity (HCC) 07/16/2021   Peripheral edema 07/16/2021   Pre-diabetes 07/16/2021   Restless leg syndrome 07/16/2021   Sleep apnea 07/16/2021   Spinal headache 07/16/2021   Spinal stenosis at L4-L5 level 07/16/2021   Valvular heart disease 07/15/2021   Iron  deficiency 12/30/2020   Iron  deficiency anemia 12/29/2020  Chronic pain 12/11/2020   Lumbar spondylosis 05/21/2020   Atrophic vaginitis 12/26/2019   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   Popliteal pain 07/23/2019   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   Malaise and fatigue 05/25/2018   Morbid obesity with BMI of 40.0-44.9, adult (HCC) 11/17/2016   GERD without esophagitis 10/08/2015   Primary hypothyroidism 10/08/2015   Benign essential hypertension 10/08/2015   High risk medication use 10/08/2015   Chest pain in adult 07/02/2015   Insomnia 11/27/2013   Spondylolisthesis, grade 2 11/27/2013    Past Surgical History:  Procedure  Laterality Date   ABDOMINAL HYSTERECTOMY     partial   CATARACT EXTRACTION Bilateral    CESAREAN SECTION     x2   RHINOPLASTY     SPINAL CORD STIMULATOR INSERTION N/A 12/11/2020   Procedure: SPINAL CORD STIMULATOR INSERTION;  Surgeon: Burnetta Aures, MD;  Location: [facility] OR;  Service: Orthopedics;  Laterality: N/A;   TONSILLECTOMY     TOTAL HIP ARTHROPLASTY Left 03/01/2023   Procedure: TOTAL HIP ARTHROPLASTY ANTERIOR APPROACH;  Surgeon: Ernie Cough, MD;  Location: [facility] ORS;  Service: Orthopedics;  Laterality: Left;   TOTAL HIP ARTHROPLASTY Left 03/20/2023   Procedure: TOTAL HIP ARTHROPLASTY ANTERIOR APPROACH;  Surgeon: Ernie Cough, MD;  Location: [facility] ORS;  Service: Orthopedics;  Laterality: Left;    OB History   No obstetric history on file.      Home Medications    Prior to Admission medications  Medication Sig Start Date End Date Taking? Authorizing Provider  albuterol  (VENTOLIN  HFA) 108 (90 Base) MCG/ACT inhaler Inhale 2 puffs into the lungs every 4 (four) hours as needed for wheezing or shortness of breath. 07/08/24  Yes Ival Domino, FNP  doxycycline  (VIBRAMYCIN ) 100 MG capsule Take 1 capsule (100 mg total) by mouth 2 (two) times daily for 10 days. 07/08/24 07/18/24 Yes Ival Domino, FNP  promethazine -dextromethorphan  (PROMETHAZINE -DM) 6.25-15 MG/5ML syrup Take 5 mLs by mouth 4 (four) times daily as needed for cough. Do not use and drive - May make drowsy. 07/08/24  Yes Ival Domino, FNP  Spacer/Aero-Holding Chambers (COMPACT SPACE CHAMBER) DEVI Use with the albuterol  inhaler 07/08/24  Yes Ival Domino, FNP  ALPRAZolam  (XANAX ) 0.5 MG tablet Take 0.5 mg by mouth. 04/25/24   [provider]  buPROPion  (WELLBUTRIN  XL) 150 MG 24 hr tablet 1 tablet in the morning Orally Once a day; Duration: 90 days 08/05/23   [provider]  clonazePAM  (KLONOPIN ) 0.5 MG tablet Take 1 mg by mouth at bedtime.    [provider]  EPINEPHrine  0.3 mg/0.3 mL IJ SOAJ injection  Inject 0.3 mg into the muscle as needed for anaphylaxis. 03/01/22   [provider]  FLUoxetine  (PROZAC ) 20 MG capsule Take 20 mg by mouth daily. 10/09/20   [provider]  furosemide  (LASIX ) 40 MG tablet Take 40 mg by mouth daily as needed for edema.    [provider]  glycopyrrolate  (ROBINUL ) 1 MG tablet Take 1 tablet (1 mg total) by mouth 2 (two) times daily as needed. Oral; Duration: 90 Days    [provider]  latanoprost  (XALATAN ) 0.005 % ophthalmic solution Place 1 drop into both eyes at bedtime. 08/03/22   [provider]  levothyroxine  (SYNTHROID ) 88 MCG tablet Take 88 mcg by mouth daily before breakfast.    [provider]  omeprazole  (PRILOSEC) 20 MG capsule Take 1 capsule (20 mg total) by mouth 2 (two) times daily before a meal. 06/28/24   May, Deanna J, NP  oxyCODONE  (  OXY IR/ROXICODONE ) 5 MG immediate release tablet Take 1-2 tablets (5-10 mg total) by mouth every 4 (four) hours as needed for moderate pain (pain score 4-6). 03/21/23   Rosario Leatrice FERNS, MD  pantoprazole  (PROTONIX ) 40 MG tablet Take 1 tablet (40 mg total) by mouth 2 (two) times daily. 06/29/24   Charlanne Groom, MD  pramipexole  (MIRAPEX ) 1 MG tablet Take 2 mg by mouth 3 (three) times daily.    [provider]  sucralfate  (CARAFATE ) 1 GM/10ML suspension 1 gram 4 times daily for 2 weeks. 06/29/24   Charlanne Groom, MD  SUMAtriptan  (IMITREX ) 100 MG tablet Take 100 mg by mouth every 2 (two) hours as needed for migraine. May repeat in 2 hours if headache persists or recurs.    [provider]    Family History Family History  Problem Relation Age of Onset   Cancer - Prostate Father    Esophageal cancer Neg Hx    Colon cancer Neg Hx    Rectal cancer Neg Hx    Stomach cancer Neg Hx     Social History Social History[1]   Allergies   Bee venom, Feldene [piroxicam], Naphazoline, Cephalosporins, Flexeril [cyclobenzaprine], and Keflex [cephalexin]   Review  of Systems Review of Systems  Constitutional:  Negative for chills and fever.  HENT:  Positive for congestion, postnasal drip and rhinorrhea. Negative for ear pain and sore throat.   Eyes:  Negative for pain and visual disturbance.  Respiratory:  Positive for cough, chest tightness, shortness of breath and wheezing.   Cardiovascular:  Negative for chest pain and palpitations.  Gastrointestinal:  Negative for abdominal pain, constipation, diarrhea, nausea and vomiting.  Genitourinary:  Negative for dysuria and hematuria.  Musculoskeletal:  Negative for arthralgias and back pain.  Skin:  Negative for color change and rash.  Neurological:  Negative for seizures and syncope.  All other systems reviewed and are negative.    Physical Exam Triage Vital Signs ED Triage Vitals  Encounter Vitals Group     BP 07/08/24 1516 (!) 144/81     Girls Systolic BP Percentile --      Girls Diastolic BP Percentile --      Boys Systolic BP Percentile --      Boys Diastolic BP Percentile --      Pulse Rate 07/08/24 1516 (!) 109     Resp 07/08/24 1516 (!) 21     Temp 07/08/24 1516 (!) 100.6 F (38.1 C)     Temp Source 07/08/24 1516 Oral     SpO2 07/08/24 1516 (!) 89 %     Weight --      Height --      Head Circumference --      Peak Flow --      Pain Score 07/08/24 1514 5     Pain Loc --      Pain Education --      Exclude from Growth Chart --    No data found.  Updated Vital Signs BP (!) 144/81 (BP Location: Right Arm)   Pulse (!) 109   Temp (!) 100.8 F (38.2 C) (Oral)   Resp (!) 21   SpO2 97%   Visual Acuity Right Eye Distance:   Left Eye Distance:   Bilateral Distance:    Right Eye Near:   Left Eye Near:    Bilateral Near:     Physical Exam Vitals and nursing note reviewed.  Constitutional:      General: She is not in acute  distress.    Appearance: She is well-developed. She is ill-appearing. She is not toxic-appearing or diaphoretic.  HENT:     Head: Normocephalic and  atraumatic.     Right Ear: Hearing, tympanic membrane, ear canal and external ear normal.     Left Ear: Hearing, tympanic membrane, ear canal and external ear normal.     Nose: Congestion and rhinorrhea present. Rhinorrhea is clear.     Right Sinus: No maxillary sinus tenderness or frontal sinus tenderness.     Left Sinus: No maxillary sinus tenderness or frontal sinus tenderness.     Mouth/Throat:     Lips: Pink.     Mouth: Mucous membranes are moist.     Pharynx: Uvula midline. No oropharyngeal exudate or posterior oropharyngeal erythema.     Tonsils: No tonsillar exudate.  Eyes:     Conjunctiva/sclera: Conjunctivae normal.     Pupils: Pupils are equal, round, and reactive to light.  Cardiovascular:     Rate and Rhythm: Normal rate and regular rhythm.     Heart sounds: S1 normal and S2 normal. No murmur heard. Pulmonary:     Effort: Pulmonary effort is normal. No respiratory distress.     Breath sounds: Examination of the right-upper field reveals wheezing. Examination of the left-upper field reveals wheezing. Examination of the right-middle field reveals wheezing. Examination of the left-middle field reveals wheezing. Examination of the right-lower field reveals decreased breath sounds. Examination of the left-lower field reveals decreased breath sounds. Decreased breath sounds and wheezing present. No rhonchi or rales.     Comments: Initial oxygen saturation was 85 to 92% on room air but the patient has painted fingernails.  When I applied a pediatric probe to her little finger just below the nail, she was receiving a breathing treatment (so she was getting oxygen at 3 L/min with the treatment) but her oxygen saturation was 100%.  Reassessment after DuoNeb treatment: Oxygen saturation 97% on room air.  Breath sounds audible throughout and wheezing still present but improved.  Patient feels less short of breath. Abdominal:     General: Bowel sounds are normal.     Palpations: Abdomen is  soft.     Tenderness: There is no abdominal tenderness.  Musculoskeletal:        General: No swelling.     Cervical back: Neck supple.  Lymphadenopathy:     Head:     Right side of head: No submental, submandibular, tonsillar, preauricular or posterior auricular adenopathy.     Left side of head: No submental, submandibular, tonsillar, preauricular or posterior auricular adenopathy.     Cervical: No cervical adenopathy.     Right cervical: No superficial cervical adenopathy.    Left cervical: No superficial cervical adenopathy.  Skin:    General: Skin is warm.     Capillary Refill: Capillary refill takes less than 2 seconds.     Findings: No rash.  Neurological:     Mental Status: She is alert and oriented to person, place, and time.     Gait: Gait abnormal (Having hip and lower back pain relating to chronic back pain and hip replacement earlier this year.  She is using a wheelchair because she is so short of breath with exertion or walking.).  Psychiatric:        Mood and Affect: Mood normal.      UC Treatments / Results  Labs (all labs ordered are listed, but only abnormal results are displayed) Labs Reviewed  POCT INFLUENZA A/B -  Normal    EKG   Radiology DG Chest 2 View Result Date: 07/08/2024 EXAM: 2 VIEW(S) XRAY OF THE CHEST 07/08/2024 04:05:00 PM COMPARISON: None available. CLINICAL HISTORY: cough FINDINGS: LINES, TUBES AND DEVICES: Spinal stimulator in place. LUNGS AND PLEURA: Hazy interstitial opacity at right lung base. No pleural effusion. No pneumothorax. HEART AND MEDIASTINUM: No acute abnormality of the cardiac and mediastinal silhouettes. BONES AND SOFT TISSUES: No acute osseous abnormality. IMPRESSION: 1. Hazy interstitial opacity at the right lung base, which may reflect atelectasis versus early pneumonitis. Electronically signed by: Greig Pique MD 07/08/2024 04:32 PM EST RP Workstation: [id number]    Procedures Procedures (including critical care  time)  Medications Ordered in UC Medications  ipratropium-albuterol  (DUONEB) 0.5-2.5 (3) MG/3ML nebulizer solution 3 mL (3 mLs Nebulization Given 07/08/24 1536)  acetaminophen  (TYLENOL ) tablet 650 mg (650 mg Oral Given 07/08/24 1620)  methylPREDNISolone  acetate (DEPO-MEDROL ) injection 80 mg (80 mg Intramuscular Given 07/08/24 1641)    Initial Impression / Assessment and Plan / UC Course  I have reviewed the triage vital signs and the nursing notes.  Pertinent labs & imaging results that were available during my care of the patient were reviewed by me and considered in my medical decision making (see chart for details).  Plan of Care (see discharge instructions for additional patient precautions and education): Community-acquired pneumonia with fever, shortness of breath, cough, hypoxia:  Negative for flu type A and B.   Chest x-ray shows early right lower lobe pneumonia.   Depo-Medrol  80 mg injection now.   Doxycycline  100 mg twice daily for 10 days.   Patient has a significant cephalosporin allergy so ceftriaxone  injection not given.   Promethazine  DM, 5 mL, every 6 hours if needed for cough.   Albuterol  inhaler with spacer, 2 puffs, every 4 hours if needed for wheezing.   Get plenty of fluids and rest. Contact primary care and make an appointment to follow-up with primary care in 2 to 3 weeks for recheck regarding pneumonia or sooner as needed.  May return here if needed.  I reviewed the plan of care with the patient and/or the patient's guardian.  The patient and/or guardian had time to ask questions and acknowledged that the questions were answered.   I spent 45 minutes on patient care, including face to face time and care planning/patient management.  Additional time needed for workup, review of test results, independent interpretation of chest x-ray prior to radiology review, education of the patient about the treatment plan. Final Clinical Impressions(s) / UC Diagnoses   Final  diagnoses:  Acute cough  Hypoxia  Shortness of breath  Fever, unspecified  Community acquired pneumonia of right lower lobe of lung     Discharge Instructions      Community-acquired pneumonia with fever, shortness of breath, cough, hypoxia: Negative for flu type A and B.  Chest x-ray shows early right lower lobe pneumonia.  Depo-Medrol  80 mg injection now.  Doxycycline  100 mg twice daily for 10 days.  Patient has a significant cephalosporin allergy so ceftriaxone  injection not given.  Promethazine  DM, 5 mL, every 6 hours if needed for cough.  Albuterol  inhaler with spacer, 2 puffs, every 4 hours if needed for wheezing.  Give plenty of fluids and rest.  Contact primary care and make an appointment to follow-up with primary care in 2 to 3 weeks for recheck regarding pneumonia or sooner as needed.  May return here if needed.     ED Prescriptions  Medication Sig Dispense Auth. Provider   albuterol  (VENTOLIN  HFA) 108 (90 Base) MCG/ACT inhaler Inhale 2 puffs into the lungs every 4 (four) hours as needed for wheezing or shortness of breath. 1 each Ival Domino, FNP   Spacer/Aero-Holding Chambers (COMPACT SPACE CHAMBER) DEVI Use with the albuterol  inhaler 1 each Ival Domino, FNP   promethazine -dextromethorphan  (PROMETHAZINE -DM) 6.25-15 MG/5ML syrup Take 5 mLs by mouth 4 (four) times daily as needed for cough. Do not use and drive - May make drowsy. 118 mL Ival Domino, FNP   doxycycline  (VIBRAMYCIN ) 100 MG capsule Take 1 capsule (100 mg total) by mouth 2 (two) times daily for 10 days. 20 capsule Ival Domino, FNP      PDMP not reviewed this encounter.    Ival Domino, FNP 07/08/24 1643     [1]  Social History Tobacco Use   Smoking status: Former   Smokeless tobacco: Never  Vaping Use   Vaping status: Never Used  Substance Use Topics   Alcohol use: Yes    Alcohol/week: 6.0 standard drinks of alcohol    Types: 2 Glasses of wine, 2 Cans of beer, 2 Shots of liquor per  week    Comment: 2 a day   Drug use: Never     Ival Domino, FNP 07/08/24 1644

## 2024-07-08 NOTE — Discharge Instructions (Addendum)
 Community-acquired pneumonia with fever, shortness of breath, cough, hypoxia: Negative for flu type A and B.  Chest x-ray shows early right lower lobe pneumonia.  Depo-Medrol  80 mg injection now.  Doxycycline  100 mg twice daily for 10 days.  Patient has a significant cephalosporin allergy so ceftriaxone  injection not given.  Promethazine  DM, 5 mL, every 6 hours if needed for cough.  Albuterol  inhaler with spacer, 2 puffs, every 4 hours if needed for wheezing.  Give plenty of fluids and rest.  Contact primary care and make an appointment to follow-up with primary care in 2 to 3 weeks for recheck regarding pneumonia or sooner as needed.  May return here if needed.

## 2024-07-08 NOTE — ED Triage Notes (Signed)
 Pt c/o shortness of breath cough nasal congestion started last night. Denies fever,ear pain. Pt has hx of bronchitis.Pt has not taken anything for symptoms.

## 2024-07-09 ENCOUNTER — Emergency Department (HOSPITAL_COMMUNITY)

## 2024-07-09 ENCOUNTER — Inpatient Hospital Stay (HOSPITAL_COMMUNITY)
Admission: EM | Admit: 2024-07-09 | Discharge: 2024-07-16 | DRG: 177 | Disposition: A | Discharge status: Home Health | Source: Ambulatory Visit | Attending: Internal Medicine | Admitting: Internal Medicine

## 2024-07-09 ENCOUNTER — Other Ambulatory Visit: Payer: Self-pay | Admitting: Hematology and Oncology

## 2024-07-09 ENCOUNTER — Inpatient Hospital Stay

## 2024-07-09 ENCOUNTER — Inpatient Hospital Stay: Admitting: Hematology and Oncology

## 2024-07-09 ENCOUNTER — Other Ambulatory Visit: Payer: Self-pay

## 2024-07-09 ENCOUNTER — Ambulatory Visit (HOSPITAL_BASED_OUTPATIENT_CLINIC_OR_DEPARTMENT_OTHER)
Admission: EM | Admit: 2024-07-09 | Discharge: 2024-07-09 | Disposition: A | Discharge status: Other Acute Inpatient Hospital | Source: Home / Self Care

## 2024-07-09 DIAGNOSIS — R5381 Other malaise: Secondary | ICD-10-CM | POA: Diagnosis present

## 2024-07-09 DIAGNOSIS — J189 Pneumonia, unspecified organism: Secondary | ICD-10-CM | POA: Diagnosis not present

## 2024-07-09 DIAGNOSIS — R7303 Prediabetes: Secondary | ICD-10-CM | POA: Diagnosis present

## 2024-07-09 DIAGNOSIS — J1008 Influenza due to other identified influenza virus with other specified pneumonia: Secondary | ICD-10-CM | POA: Diagnosis present

## 2024-07-09 DIAGNOSIS — F419 Anxiety disorder, unspecified: Secondary | ICD-10-CM | POA: Diagnosis present

## 2024-07-09 DIAGNOSIS — Z6841 Body Mass Index (BMI) 40.0 and over, adult: Secondary | ICD-10-CM

## 2024-07-09 DIAGNOSIS — Z79899 Other long term (current) drug therapy: Secondary | ICD-10-CM

## 2024-07-09 DIAGNOSIS — D509 Iron deficiency anemia, unspecified: Secondary | ICD-10-CM | POA: Diagnosis present

## 2024-07-09 DIAGNOSIS — Z91030 Bee allergy status: Secondary | ICD-10-CM

## 2024-07-09 DIAGNOSIS — G2581 Restless legs syndrome: Secondary | ICD-10-CM | POA: Diagnosis present

## 2024-07-09 DIAGNOSIS — Z888 Allergy status to other drugs, medicaments and biological substances status: Secondary | ICD-10-CM

## 2024-07-09 DIAGNOSIS — R0602 Shortness of breath: Secondary | ICD-10-CM

## 2024-07-09 DIAGNOSIS — J101 Influenza due to other identified influenza virus with other respiratory manifestations: Secondary | ICD-10-CM

## 2024-07-09 DIAGNOSIS — E782 Mixed hyperlipidemia: Secondary | ICD-10-CM | POA: Diagnosis present

## 2024-07-09 DIAGNOSIS — I10 Essential (primary) hypertension: Secondary | ICD-10-CM | POA: Diagnosis present

## 2024-07-09 DIAGNOSIS — I110 Hypertensive heart disease with heart failure: Secondary | ICD-10-CM | POA: Diagnosis present

## 2024-07-09 DIAGNOSIS — Z90710 Acquired absence of both cervix and uterus: Secondary | ICD-10-CM

## 2024-07-09 DIAGNOSIS — K219 Gastro-esophageal reflux disease without esophagitis: Secondary | ICD-10-CM | POA: Diagnosis present

## 2024-07-09 DIAGNOSIS — G4733 Obstructive sleep apnea (adult) (pediatric): Secondary | ICD-10-CM | POA: Diagnosis present

## 2024-07-09 DIAGNOSIS — U071 COVID-19: Principal | ICD-10-CM | POA: Diagnosis present

## 2024-07-09 DIAGNOSIS — F32A Depression, unspecified: Secondary | ICD-10-CM | POA: Diagnosis present

## 2024-07-09 DIAGNOSIS — Z881 Allergy status to other antibiotic agents status: Secondary | ICD-10-CM

## 2024-07-09 DIAGNOSIS — J09X1 Influenza due to identified novel influenza A virus with pneumonia: Secondary | ICD-10-CM | POA: Diagnosis present

## 2024-07-09 DIAGNOSIS — D649 Anemia, unspecified: Secondary | ICD-10-CM

## 2024-07-09 DIAGNOSIS — Z96642 Presence of left artificial hip joint: Secondary | ICD-10-CM | POA: Diagnosis present

## 2024-07-09 DIAGNOSIS — E66813 Obesity, class 3: Secondary | ICD-10-CM | POA: Diagnosis present

## 2024-07-09 DIAGNOSIS — J9601 Acute respiratory failure with hypoxia: Secondary | ICD-10-CM | POA: Diagnosis not present

## 2024-07-09 DIAGNOSIS — J188 Other pneumonia, unspecified organism: Secondary | ICD-10-CM

## 2024-07-09 DIAGNOSIS — D638 Anemia in other chronic diseases classified elsewhere: Secondary | ICD-10-CM | POA: Diagnosis present

## 2024-07-09 DIAGNOSIS — R0902 Hypoxemia: Secondary | ICD-10-CM

## 2024-07-09 DIAGNOSIS — I5033 Acute on chronic diastolic (congestive) heart failure: Secondary | ICD-10-CM | POA: Diagnosis present

## 2024-07-09 DIAGNOSIS — Z79890 Hormone replacement therapy (postmenopausal): Secondary | ICD-10-CM

## 2024-07-09 DIAGNOSIS — E876 Hypokalemia: Secondary | ICD-10-CM | POA: Diagnosis present

## 2024-07-09 DIAGNOSIS — J1282 Pneumonia due to coronavirus disease 2019: Secondary | ICD-10-CM | POA: Diagnosis present

## 2024-07-09 DIAGNOSIS — E611 Iron deficiency: Secondary | ICD-10-CM | POA: Diagnosis present

## 2024-07-09 DIAGNOSIS — K449 Diaphragmatic hernia without obstruction or gangrene: Secondary | ICD-10-CM | POA: Diagnosis present

## 2024-07-09 DIAGNOSIS — Z8616 Personal history of COVID-19: Secondary | ICD-10-CM

## 2024-07-09 DIAGNOSIS — Z87891 Personal history of nicotine dependence: Secondary | ICD-10-CM

## 2024-07-09 DIAGNOSIS — E039 Hypothyroidism, unspecified: Secondary | ICD-10-CM | POA: Diagnosis present

## 2024-07-09 LAB — CBC WITH DIFFERENTIAL/PLATELET
Abs Immature Granulocytes: 0.19 K/uL — ABNORMAL HIGH (ref 0.00–0.07)
Basophils Absolute: 0 K/uL (ref 0.0–0.1)
Basophils Relative: 1 %
Eosinophils Absolute: 0 K/uL (ref 0.0–0.5)
Eosinophils Relative: 0 %
HCT: 28.2 % — ABNORMAL LOW (ref 36.0–46.0)
Hemoglobin: 7.9 g/dL — ABNORMAL LOW (ref 12.0–15.0)
Immature Granulocytes: 2 %
Lymphocytes Relative: 6 %
Lymphs Abs: 0.5 K/uL — ABNORMAL LOW (ref 0.7–4.0)
MCH: 19.8 pg — ABNORMAL LOW (ref 26.0–34.0)
MCHC: 28 g/dL — ABNORMAL LOW (ref 30.0–36.0)
MCV: 70.9 fL — ABNORMAL LOW (ref 80.0–100.0)
Monocytes Absolute: 0.9 K/uL (ref 0.1–1.0)
Monocytes Relative: 11 %
Neutro Abs: 6.5 K/uL (ref 1.7–7.7)
Neutrophils Relative %: 80 %
Platelets: 330 K/uL (ref 150–400)
RBC: 3.98 MIL/uL (ref 3.87–5.11)
RDW: 19.9 % — ABNORMAL HIGH (ref 11.5–15.5)
WBC: 8.1 K/uL (ref 4.0–10.5)
nRBC: 0.4 % — ABNORMAL HIGH (ref 0.0–0.2)

## 2024-07-09 LAB — I-STAT CHEM 8, ED
BUN: 9 mg/dL (ref 8–23)
Calcium, Ion: 1.1 mmol/L — ABNORMAL LOW (ref 1.15–1.40)
Chloride: 93 mmol/L — ABNORMAL LOW (ref 98–111)
Creatinine, Ser: 0.8 mg/dL (ref 0.44–1.00)
Glucose, Bld: 91 mg/dL (ref 70–99)
HCT: 29 % — ABNORMAL LOW (ref 36.0–46.0)
Hemoglobin: 9.9 g/dL — ABNORMAL LOW (ref 12.0–15.0)
Potassium: 3.4 mmol/L — ABNORMAL LOW (ref 3.5–5.1)
Sodium: 134 mmol/L — ABNORMAL LOW (ref 135–145)
TCO2: 31 mmol/L (ref 22–32)

## 2024-07-09 LAB — COMPREHENSIVE METABOLIC PANEL WITH GFR
ALT: 16 U/L (ref 0–44)
AST: 25 U/L (ref 15–41)
Albumin: 3.6 g/dL (ref 3.5–5.0)
Alkaline Phosphatase: 81 U/L (ref 38–126)
Anion gap: 9 (ref 5–15)
BUN: 8 mg/dL (ref 8–23)
CO2: 30 mmol/L (ref 22–32)
Calcium: 8.6 mg/dL — ABNORMAL LOW (ref 8.9–10.3)
Chloride: 95 mmol/L — ABNORMAL LOW (ref 98–111)
Creatinine, Ser: 0.69 mg/dL (ref 0.44–1.00)
GFR, Estimated: >60 mL/min (ref >60)
Glucose, Bld: 90 mg/dL (ref 70–99)
Potassium: 3.4 mmol/L — ABNORMAL LOW (ref 3.5–5.1)
Sodium: 134 mmol/L — ABNORMAL LOW (ref 135–145)
Total Bilirubin: 0.3 mg/dL (ref 0.0–1.2)
Total Protein: 6.5 g/dL (ref 6.5–8.1)

## 2024-07-09 LAB — PHOSPHORUS: Phosphorus: 4 mg/dL (ref 2.5–4.6)

## 2024-07-09 LAB — RESP PANEL BY RT-PCR (RSV, FLU A&B, COVID)  RVPGX2
Influenza A by PCR: POSITIVE — AB
Influenza B by PCR: NEGATIVE
Resp Syncytial Virus by PCR: NEGATIVE
SARS Coronavirus 2 by RT PCR: POSITIVE — AB

## 2024-07-09 LAB — PROTIME-INR
INR: 1.2 (ref 0.8–1.2)
Prothrombin Time: 16 s — ABNORMAL HIGH (ref 11.4–15.2)

## 2024-07-09 LAB — MAGNESIUM: Magnesium: 1.8 mg/dL (ref 1.7–2.4)

## 2024-07-09 LAB — I-STAT VENOUS BLOOD GAS, ED
Acid-Base Excess: 6 mmol/L — ABNORMAL HIGH (ref 0.0–2.0)
Bicarbonate: 32.7 mmol/L — ABNORMAL HIGH (ref 20.0–28.0)
Calcium, Ion: 1.11 mmol/L — ABNORMAL LOW (ref 1.15–1.40)
HCT: 29 % — ABNORMAL LOW (ref 36.0–46.0)
Hemoglobin: 9.9 g/dL — ABNORMAL LOW (ref 12.0–15.0)
O2 Saturation: 62 %
Potassium: 3.4 mmol/L — ABNORMAL LOW (ref 3.5–5.1)
Sodium: 134 mmol/L — ABNORMAL LOW (ref 135–145)
TCO2: 34 mmol/L — ABNORMAL HIGH (ref 22–32)
pCO2, Ven: 56.2 mmHg (ref 44–60)
pH, Ven: 7.373 (ref 7.25–7.43)
pO2, Ven: 34 mmHg (ref 32–45)

## 2024-07-09 LAB — CBG MONITORING, ED
Glucose-Capillary: 162 mg/dL — ABNORMAL HIGH (ref 70–99)
Glucose-Capillary: 167 mg/dL — ABNORMAL HIGH (ref 70–99)

## 2024-07-09 LAB — BRAIN NATRIURETIC PEPTIDE: B Natriuretic Peptide: 191.8 pg/mL — ABNORMAL HIGH (ref 0.0–100.0)

## 2024-07-09 LAB — HIV ANTIBODY (ROUTINE TESTING W REFLEX): HIV Screen 4th Generation wRfx: NONREACTIVE

## 2024-07-09 LAB — I-STAT CG4 LACTIC ACID, ED: Lactic Acid, Venous: 0.6 mmol/L (ref 0.5–1.9)

## 2024-07-09 LAB — C-REACTIVE PROTEIN: CRP: 11.1 mg/dL — ABNORMAL HIGH (ref <1.0)

## 2024-07-09 LAB — HEMOGLOBIN A1C
Hgb A1c MFr Bld: 5.3 % (ref 4.8–5.6)
Mean Plasma Glucose: 105.41 mg/dL

## 2024-07-09 LAB — LACTATE DEHYDROGENASE: LDH: 192 U/L (ref 105–235)

## 2024-07-09 MED ORDER — INSULIN ASPART 100 UNIT/ML IJ SOLN
0.0000 [IU] | Freq: Three times a day (TID) | INTRAMUSCULAR | Status: DC
  Administered 2024-07-09 – 2024-07-13 (×5): 1 [IU] via SUBCUTANEOUS
  Administered 2024-07-14: 3 [IU] via SUBCUTANEOUS
  Administered 2024-07-15: 1 [IU] via SUBCUTANEOUS
  Filled 2024-07-09: qty 3
  Filled 2024-07-09 (×4): qty 1

## 2024-07-09 MED ORDER — SODIUM CHLORIDE 0.9% FLUSH
3.0000 mL | Freq: Two times a day (BID) | INTRAVENOUS | Status: DC
  Administered 2024-07-09 – 2024-07-16 (×14): 3 mL via INTRAVENOUS

## 2024-07-09 MED ORDER — GUAIFENESIN 100 MG/5ML PO LIQD
10.0000 mL | Freq: Three times a day (TID) | ORAL | Status: DC
  Administered 2024-07-09 – 2024-07-12 (×7): 10 mL via ORAL
  Filled 2024-07-09 (×6): qty 15

## 2024-07-09 MED ORDER — IPRATROPIUM-ALBUTEROL 0.5-2.5 (3) MG/3ML IN SOLN
3.0000 mL | Freq: Once | RESPIRATORY_TRACT | Status: AC
  Administered 2024-07-09: 12:00:00 3 mL via RESPIRATORY_TRACT
  Filled 2024-07-09: qty 3

## 2024-07-09 MED ORDER — BISACODYL 5 MG PO TBEC
5.0000 mg | DELAYED_RELEASE_TABLET | Freq: Every day | ORAL | Status: DC | PRN
  Administered 2024-07-12: 15:00:00 5 mg via ORAL
  Filled 2024-07-09: qty 1

## 2024-07-09 MED ORDER — BUPROPION HCL ER (XL) 150 MG PO TB24
150.0000 mg | ORAL_TABLET | Freq: Every day | ORAL | Status: DC
  Administered 2024-07-10 – 2024-07-16 (×7): 150 mg via ORAL
  Filled 2024-07-09 (×7): qty 1

## 2024-07-09 MED ORDER — PANTOPRAZOLE SODIUM 40 MG PO TBEC
40.0000 mg | DELAYED_RELEASE_TABLET | Freq: Every day | ORAL | Status: DC
  Administered 2024-07-10 – 2024-07-16 (×7): 40 mg via ORAL
  Filled 2024-07-09 (×7): qty 1

## 2024-07-09 MED ORDER — SUMATRIPTAN SUCCINATE 100 MG PO TABS
100.0000 mg | ORAL_TABLET | ORAL | Status: DC | PRN
  Administered 2024-07-10 – 2024-07-14 (×8): 100 mg via ORAL
  Filled 2024-07-09 (×10): qty 1

## 2024-07-09 MED ORDER — ACETAMINOPHEN 500 MG PO TABS
1000.0000 mg | ORAL_TABLET | Freq: Three times a day (TID) | ORAL | Status: DC
  Administered 2024-07-09 – 2024-07-16 (×21): 1000 mg via ORAL
  Filled 2024-07-09 (×21): qty 2

## 2024-07-09 MED ORDER — SODIUM CHLORIDE 0.9 % IV SOLN
200.0000 mg | Freq: Once | INTRAVENOUS | Status: AC
  Administered 2024-07-09: 20:00:00 200 mg via INTRAVENOUS
  Filled 2024-07-09: qty 40

## 2024-07-09 MED ORDER — TRAZODONE HCL 50 MG PO TABS
25.0000 mg | ORAL_TABLET | Freq: Every evening | ORAL | Status: DC | PRN
  Administered 2024-07-13: 25 mg via ORAL
  Filled 2024-07-09: qty 1

## 2024-07-09 MED ORDER — LEVOFLOXACIN IN D5W 750 MG/150ML IV SOLN
750.0000 mg | Freq: Once | INTRAVENOUS | Status: AC
  Administered 2024-07-09: 12:00:00 750 mg via INTRAVENOUS
  Filled 2024-07-09: qty 150

## 2024-07-09 MED ORDER — FUROSEMIDE 40 MG PO TABS
40.0000 mg | ORAL_TABLET | Freq: Every day | ORAL | Status: DC
  Administered 2024-07-10 – 2024-07-12 (×3): 40 mg via ORAL
  Filled 2024-07-09: qty 2
  Filled 2024-07-09 (×2): qty 1

## 2024-07-09 MED ORDER — LEVOTHYROXINE SODIUM 88 MCG PO TABS
88.0000 ug | ORAL_TABLET | Freq: Every day | ORAL | Status: DC
  Administered 2024-07-10 – 2024-07-16 (×7): 88 ug via ORAL
  Filled 2024-07-09 (×7): qty 1

## 2024-07-09 MED ORDER — PRAMIPEXOLE DIHYDROCHLORIDE 1 MG PO TABS
2.0000 mg | ORAL_TABLET | Freq: Three times a day (TID) | ORAL | Status: DC
  Administered 2024-07-09 – 2024-07-16 (×18): 2 mg via ORAL
  Filled 2024-07-09 (×4): qty 2
  Filled 2024-07-09 (×2): qty 8
  Filled 2024-07-09 (×2): qty 2
  Filled 2024-07-09 (×3): qty 8
  Filled 2024-07-09: qty 2
  Filled 2024-07-09 (×2): qty 8
  Filled 2024-07-09 (×6): qty 2
  Filled 2024-07-09: qty 8
  Filled 2024-07-09 (×3): qty 2
  Filled 2024-07-09: qty 8

## 2024-07-09 MED ORDER — IOHEXOL 350 MG/ML SOLN
75.0000 mL | Freq: Once | INTRAVENOUS | Status: AC | PRN
  Administered 2024-07-09: 14:00:00 75 mL via INTRAVENOUS

## 2024-07-09 MED ORDER — ZINC SULFATE 220 (50 ZN) MG PO CAPS
220.0000 mg | ORAL_CAPSULE | Freq: Every day | ORAL | Status: DC
  Administered 2024-07-10 – 2024-07-16 (×7): 220 mg via ORAL
  Filled 2024-07-09 (×7): qty 1

## 2024-07-09 MED ORDER — HYDRALAZINE HCL 20 MG/ML IJ SOLN: 10.0000 mg | INTRAMUSCULAR | Status: DC | PRN

## 2024-07-09 MED ORDER — OSELTAMIVIR PHOSPHATE 75 MG PO CAPS
75.0000 mg | ORAL_CAPSULE | Freq: Once | ORAL | Status: AC
  Administered 2024-07-09: 15:00:00 75 mg via ORAL
  Filled 2024-07-09: qty 1

## 2024-07-09 MED ORDER — ACETAMINOPHEN 650 MG RE SUPP: 650.0000 mg | Freq: Three times a day (TID) | RECTAL | Status: DC

## 2024-07-09 MED ORDER — VITAMIN C 500 MG PO TABS
500.0000 mg | ORAL_TABLET | Freq: Every day | ORAL | Status: AC
  Administered 2024-07-09 – 2024-07-13 (×5): 500 mg via ORAL
  Filled 2024-07-09 (×5): qty 1

## 2024-07-09 MED ORDER — DEXTROSE 5 % IV SOLN
500.0000 mg | INTRAVENOUS | Status: DC
  Administered 2024-07-10: 18:00:00 500 mg via INTRAVENOUS
  Filled 2024-07-09 (×3): qty 5

## 2024-07-09 MED ORDER — HYALURONIDASE HUMAN 150 UNIT/ML IJ SOLN
150.0000 [IU] | Freq: Once | INTRAMUSCULAR | Status: AC
  Administered 2024-07-09: 22:00:00 150 [IU] via SUBCUTANEOUS
  Filled 2024-07-09: qty 1

## 2024-07-09 MED ORDER — IPRATROPIUM-ALBUTEROL 0.5-2.5 (3) MG/3ML IN SOLN
3.0000 mL | Freq: Four times a day (QID) | RESPIRATORY_TRACT | Status: DC
  Administered 2024-07-09 – 2024-07-11 (×8): 3 mL via RESPIRATORY_TRACT
  Filled 2024-07-09 (×7): qty 3

## 2024-07-09 MED ORDER — ALPRAZOLAM 0.5 MG PO TABS
0.5000 mg | ORAL_TABLET | Freq: Three times a day (TID) | ORAL | Status: DC | PRN
  Administered 2024-07-10 – 2024-07-15 (×8): 0.5 mg via ORAL
  Filled 2024-07-09 (×10): qty 1

## 2024-07-09 MED ORDER — OSELTAMIVIR PHOSPHATE 75 MG PO CAPS
75.0000 mg | ORAL_CAPSULE | Freq: Two times a day (BID) | ORAL | Status: DC
  Administered 2024-07-09 – 2024-07-14 (×10): 75 mg via ORAL
  Filled 2024-07-09 (×11): qty 1

## 2024-07-09 MED ORDER — CLONAZEPAM 1 MG PO TABS
1.0000 mg | ORAL_TABLET | Freq: Every day | ORAL | Status: DC
  Administered 2024-07-09 – 2024-07-15 (×6): 1 mg via ORAL
  Filled 2024-07-09: qty 1
  Filled 2024-07-09 (×3): qty 2
  Filled 2024-07-09 (×2): qty 1
  Filled 2024-07-09: qty 2

## 2024-07-09 MED ORDER — SODIUM CHLORIDE 0.9% FLUSH
3.0000 mL | Freq: Two times a day (BID) | INTRAVENOUS | Status: DC
  Administered 2024-07-09 – 2024-07-16 (×14): 3 mL via INTRAVENOUS

## 2024-07-09 MED ORDER — HEPARIN SODIUM (PORCINE) 5000 UNIT/ML IJ SOLN
5000.0000 [IU] | Freq: Three times a day (TID) | INTRAMUSCULAR | Status: DC
  Administered 2024-07-09 – 2024-07-16 (×21): 5000 [IU] via SUBCUTANEOUS
  Filled 2024-07-09 (×21): qty 1

## 2024-07-09 MED ORDER — SENNOSIDES-DOCUSATE SODIUM 8.6-50 MG PO TABS: 1.0000 | ORAL_TABLET | Freq: Every evening | ORAL | Status: DC | PRN

## 2024-07-09 MED ORDER — HYDROMORPHONE HCL 1 MG/ML IJ SOLN
0.5000 mg | INTRAMUSCULAR | Status: DC | PRN
  Filled 2024-07-09: qty 1

## 2024-07-09 MED ORDER — OXYCODONE HCL 5 MG PO TABS
5.0000 mg | ORAL_TABLET | ORAL | Status: DC | PRN
  Administered 2024-07-09 – 2024-07-12 (×4): 5 mg via ORAL
  Filled 2024-07-09 (×4): qty 1

## 2024-07-09 MED ORDER — METHYLPREDNISOLONE SODIUM SUCC 40 MG IJ SOLR
40.0000 mg | Freq: Two times a day (BID) | INTRAMUSCULAR | Status: DC
  Administered 2024-07-09 – 2024-07-11 (×4): 40 mg via INTRAVENOUS
  Filled 2024-07-09 (×4): qty 1

## 2024-07-09 MED ORDER — LEVOFLOXACIN IN D5W 750 MG/150ML IV SOLN: 750.0000 mg | INTRAVENOUS | Status: DC

## 2024-07-09 MED ORDER — FLEET ENEMA RE ENEM: 1.0000 | ENEMA | Freq: Once | RECTAL | Status: DC | PRN

## 2024-07-09 MED ORDER — LACTATED RINGERS IV SOLN: INTRAVENOUS | Status: AC

## 2024-07-09 MED ORDER — FLUOXETINE HCL 20 MG PO CAPS
20.0000 mg | ORAL_CAPSULE | Freq: Every day | ORAL | Status: DC
  Administered 2024-07-10 – 2024-07-16 (×7): 20 mg via ORAL
  Filled 2024-07-09 (×7): qty 1

## 2024-07-09 MED ORDER — IPRATROPIUM BROMIDE 0.02 % IN SOLN
0.5000 mg | Freq: Four times a day (QID) | RESPIRATORY_TRACT | Status: DC | PRN
  Administered 2024-07-12 – 2024-07-15 (×3): 0.5 mg via RESPIRATORY_TRACT
  Filled 2024-07-09 (×3): qty 2.5

## 2024-07-09 MED ORDER — CALCIUM GLUCONATE-NACL 2-0.675 GM/100ML-% IV SOLN
2.0000 g | Freq: Once | INTRAVENOUS | Status: AC
  Administered 2024-07-09: 19:00:00 2000 mg via INTRAVENOUS
  Filled 2024-07-09: qty 100

## 2024-07-09 MED ORDER — ONDANSETRON HCL 4 MG PO TABS: 4.0000 mg | ORAL_TABLET | Freq: Four times a day (QID) | ORAL | Status: DC | PRN

## 2024-07-09 MED ORDER — GLYCOPYRROLATE 1 MG PO TABS: 1.0000 mg | ORAL_TABLET | Freq: Three times a day (TID) | ORAL | Status: DC

## 2024-07-09 MED ORDER — GUAIFENESIN-DM 100-10 MG/5ML PO SYRP
5.0000 mL | ORAL_SOLUTION | Freq: Four times a day (QID) | ORAL | Status: DC | PRN
  Administered 2024-07-10: 04:00:00 5 mL via ORAL
  Filled 2024-07-09: qty 5

## 2024-07-09 MED ORDER — SUMATRIPTAN SUCCINATE 6 MG/0.5ML ~~LOC~~ SOLN
6.0000 mg | Freq: Once | SUBCUTANEOUS | Status: AC
  Administered 2024-07-09: 12:00:00 6 mg via SUBCUTANEOUS
  Filled 2024-07-09: qty 0.5

## 2024-07-09 MED ORDER — SODIUM CHLORIDE 0.9 % IV SOLN
100.0000 mg | Freq: Every day | INTRAVENOUS | Status: AC
  Administered 2024-07-10 – 2024-07-11 (×2): 100 mg via INTRAVENOUS
  Filled 2024-07-09 (×2): qty 20

## 2024-07-09 MED ORDER — ONDANSETRON HCL 4 MG/2ML IJ SOLN: 4.0000 mg | Freq: Four times a day (QID) | INTRAMUSCULAR | Status: DC | PRN

## 2024-07-09 MED ORDER — METHYLPREDNISOLONE SODIUM SUCC 125 MG IJ SOLR
125.0000 mg | Freq: Once | INTRAMUSCULAR | Status: AC
  Administered 2024-07-09: 12:00:00 125 mg via INTRAVENOUS
  Filled 2024-07-09: qty 2

## 2024-07-09 MED ORDER — SODIUM CHLORIDE 0.9 % IV SOLN
1.0000 g | INTRAVENOUS | Status: DC
  Administered 2024-07-09 – 2024-07-13 (×5): 1 g via INTRAVENOUS
  Filled 2024-07-09 (×5): qty 10

## 2024-07-09 NOTE — ED Notes (Signed)
 [facility] aware of critical labs

## 2024-07-09 NOTE — ED Provider Triage Note (Signed)
 Emergency Medicine Provider Triage Evaluation Note  Kendra [name] , a 78 y.o. female  was evaluated in triage.  Pt complains of increasing shortness of breath and cough.  The patient was seen yesterday in urgent care and was diagnosed with pneumonia.  Was started on doxycycline .  Did take 1 dose of this.  Called medics this morning due to increased shortness of breath.  Was found to be hypoxic in the 70s.  Was placed on nasal cannula oxygen.  She denies any hemoptysis.  Review of Systems  Positive: See above Negative: Hemoptysis  Physical Exam  BP 137/86   Pulse (!) 102   Temp (!) 100.6 F (38.1 C)   Resp 20   SpO2 100%  Gen:   Mild conversational dyspnea noted Resp:  Conversational dyspnea noted with wheezes MSK:   Moves extremities without difficulty  Other:  No focal deficits on neuroexam  Medical Decision Making  Medically screening exam initiated at 10:13 AM.  Appropriate orders placed.  Kendra [name] was informed that the remainder of the evaluation will be completed by another provider, this initial triage assessment does not replace that evaluation, and the importance of remaining in the ED until their evaluation is complete.  The patient presents today with hypoxia, fever, and tachycardia.  Will initiate a sepsis workup on the patient here.  Will give the patient Levaquin  here as well as DuoNebs.  Does have some trace edema of the lower extremities.  Will hold off on IV fluids at this time until her x-ray is resulted.  She is wheezing here on exam.  Reports recurrent issues with bronchitis in the past.  Will give her Solu-Medrol  and DuoNebs as well.   Ula Prentice SAUNDERS, MD 07/09/24 [phone]

## 2024-07-09 NOTE — Assessment & Plan Note (Signed)
 Anticipating hyperglycemia with IV steroids -Check her CBG q. ACHS, SSI coverage -Last A1c 2 months ago 5.4

## 2024-07-09 NOTE — ED Notes (Signed)
 O2 3L [place] applied. EMS called to transport to ER.

## 2024-07-09 NOTE — Assessment & Plan Note (Addendum)
 Review home medication resuming meds accordingly - Continue home medication of Mirapex 

## 2024-07-09 NOTE — Assessment & Plan Note (Signed)
 Mood stable, home medication reviewed currently on Wellbutrin , Klonopin , and Prozac 

## 2024-07-09 NOTE — ED Notes (Signed)
 [facility] arrival to facility. Bedside report given. Care transferred to ambulance team. Patient remains alert and awake. Oxygen remains in use.

## 2024-07-09 NOTE — ED Triage Notes (Signed)
 Patient returns to urgent care for worsening shortness of breath. DX with pneumonia at urgent care yesterday. Was hypoxic at that time. Patient  arrives with O2 sat 78% on room air. Patient grunting and unable to speak in full sentences. Provider brought to room for eval. EMS will be called.

## 2024-07-09 NOTE — Assessment & Plan Note (Addendum)
-   Continue with supportive therapy - IV steroids, mucolytics, - Will encourage use of flutter valve, incentive spirometer, - With inflammatory markers including CRP, - Consulting RT for pulmonary toiletry, nebs, scheduled and as needed  - ID consulted, recommended remdesivir  x 3 days

## 2024-07-09 NOTE — Assessment & Plan Note (Signed)
 Continue PPI.

## 2024-07-09 NOTE — Hospital Course (Signed)
 Kendra [name] is a 78 year old female with past medical history of anemia of chronic disease, GERD, depression, anxiety, hypothyroidism, HLD, RLS, HTN, GERD, obesity presenting with chief complaint shortness of breath and cough. Patient reports upper respiratory symptoms cough, headaches, shortness of breath, nasal congestion, started about 2 days ago.  He was initially seen at urgent care, was given a shot of steroids, doxycycline .  But symptoms persistently has gotten worse.  The patient was hypoxia and febrile to follow-up visit at urgent care, was redirected to ED. Currently awake complaining of cough congestion, malaise... Stating not O2 dependent, no history of asthma, emphysema or COPD.  Does not smoke.   ED Evaluation: Blood pressure (!) 119/98, pulse 92, temperature 98.2 F (36.8 C), temperature source Oral, resp. rate (!) 21, SpO2 92% on 4 L of oxygen. LABs: VBG reviewed, pH 7.3/56.2/34 bicarb 32.7, sodium 134, potassium 3.4, chloride 98, calcium  8.6, INR calcium  1.10 BNP 191.8, WBC 8.1, hemoglobin 9.9 Influenza A positive, influenza B negative, RSV negative, SARS-CoV-2 positive CT angiogram negative for acute pulmonary embolism, multilobar pneumonia  Requested for admission for acute respiratory failure due to influenza A, SARS-CoV-2, multilobular pneumonia.

## 2024-07-09 NOTE — Assessment & Plan Note (Addendum)
-   Continue with Tamiflu , of care -Treating pneumonia with empiric antibiotics - Consulted, recommended to continue Tamiflu

## 2024-07-09 NOTE — ED Notes (Signed)
 Previous nurse noted to me that she checked iv and had a second nurse  check before finishing iv calcium . Per nurse Iv calcium  was finished. I went to bedside to start remdesiver and noticed the iv sit was red swollen and blistered at the site. Stopped fluids and removed iv  sent page to provider and notified ED pharmacy.

## 2024-07-09 NOTE — H&P (Addendum)
 History and Physical   Patient: Kendra [name]                            PCP: Benson [name], NP                    DOB: [birth year]            DOA: 07/09/2024 FMW:[id number]             DOS: 07/09/2024, 4:08 PM  Brown-Patram, [name], NP  Patient coming from:   HOME  I have personally reviewed patient's medical records, in electronic medical records, including:  [facility], and care everywhere.    Chief Complaint:   Chief Complaint  Patient presents with   Shortness of Breath    History of present illness:    Kendra [name] is a 78 year old female with past medical history of anemia of chronic disease, GERD, depression, anxiety, hypothyroidism, HLD, RLS, HTN, GERD, obesity presenting with chief complaint shortness of breath and cough. Patient reports upper respiratory symptoms cough, headaches, shortness of breath, nasal congestion, started about 2 days ago.  He was initially seen at urgent care, was given a shot of steroids, doxycycline .  But symptoms persistently has gotten worse.  The patient was hypoxia and febrile to follow-up visit at urgent care, was redirected to ED. Currently awake complaining of cough congestion, malaise... Stating not O2 dependent, no history of asthma, emphysema or COPD.  Does not smoke.   ED Evaluation: Blood pressure (!) 119/98, pulse 92, temperature 98.2 F (36.8 C), temperature source Oral, resp. rate (!) 21, SpO2 92% on 4 L of oxygen. LABs: VBG reviewed, pH 7.3/56.2/34 bicarb 32.7, sodium 134, potassium 3.4, chloride 98, calcium  8.6, INR calcium  1.10 BNP 191.8, WBC 8.1, hemoglobin 9.9 Influenza A positive, influenza B negative, RSV negative, SARS-CoV-2 positive CT angiogram negative for acute pulmonary embolism, multilobar pneumonia  Requested for admission for acute respiratory failure due to influenza A, SARS-CoV-2, multilobular pneumonia.  Patient Denies having:  Chest Pain, Abd pain, N/V/D, headache, dizziness,  lightheadedness,  Dysuria, Joint pain, rash, open wounds   Review of Systems: As per HPI, otherwise 10 point review of systems were negative.   ----------------------------------------------------------------------------------------------------------------------  Allergies[1]  Home MEDs:  Prior to Admission medications  Medication Sig Start Date End Date Taking? Authorizing Provider  albuterol  (VENTOLIN  HFA) 108 (90 Base) MCG/ACT inhaler Inhale 2 puffs into the lungs every 4 (four) hours as needed for wheezing or shortness of breath. 07/08/24   Ival Domino, FNP  ALPRAZolam  (XANAX ) 0.5 MG tablet Take 0.5 mg by mouth. 04/25/24   [provider]  buPROPion  (WELLBUTRIN  XL) 150 MG 24 hr tablet 1 tablet in the morning Orally Once a day; Duration: 90 days 08/05/23   [provider]  clonazePAM  (KLONOPIN ) 0.5 MG tablet Take 1 mg by mouth at bedtime.    [provider]  D3 MAXIMUM STRENGTH 125 MCG (5000 UT) capsule Take by mouth. 06/28/24   [provider]  doxycycline  (VIBRAMYCIN ) 100 MG capsule Take 1 capsule (100 mg total) by mouth 2 (two) times daily for 10 days. 07/08/24 07/18/24  Ival Domino, FNP  EPINEPHrine  0.3 mg/0.3 mL IJ SOAJ injection Inject 0.3 mg into the muscle as needed for anaphylaxis. 03/01/22   [provider]  FLUoxetine  (PROZAC ) 20 MG capsule Take 20 mg by mouth daily. 10/09/20   [provider]  furosemide  (LASIX ) 40 MG tablet  Take 40 mg by mouth daily as needed for edema.    [provider]  glycopyrrolate  (ROBINUL ) 1 MG tablet Take 1 tablet (1 mg total) by mouth 2 (two) times daily as needed. Oral; Duration: 90 Days    [provider]  latanoprost  (XALATAN ) 0.005 % ophthalmic solution Place 1 drop into both eyes at bedtime. 08/03/22   [provider]  levothyroxine  (SYNTHROID ) 88 MCG tablet Take 88 mcg by mouth daily before breakfast.    [provider]  omeprazole  (PRILOSEC) 20 MG capsule  Take 1 capsule (20 mg total) by mouth 2 (two) times daily before a meal. 06/28/24   May, Deanna J, NP  oxyCODONE  (OXY IR/ROXICODONE ) 5 MG immediate release tablet Take 1-2 tablets (5-10 mg total) by mouth every 4 (four) hours as needed for moderate pain (pain score 4-6). 03/21/23   Rosario Leatrice FERNS, MD  pantoprazole  (PROTONIX ) 40 MG tablet Take 1 tablet (40 mg total) by mouth 2 (two) times daily. 06/29/24   Charlanne Groom, MD  pramipexole  (MIRAPEX ) 1 MG tablet Take 2 mg by mouth 3 (three) times daily.    [provider]  promethazine -dextromethorphan  (PROMETHAZINE -DM) 6.25-15 MG/5ML syrup Take 5 mLs by mouth 4 (four) times daily as needed for cough. Do not use and drive - May make drowsy. 07/08/24   Ival Domino, FNP  Spacer/Aero-Holding Chambers (COMPACT SPACE CHAMBER) DEVI Use with the albuterol  inhaler 07/08/24   Ival Domino, FNP  sucralfate  (CARAFATE ) 1 g tablet Take 1 g by mouth 4 (four) times daily. 06/29/24   [provider]  sucralfate  (CARAFATE ) 1 GM/10ML suspension 1 gram 4 times daily for 2 weeks. 06/29/24   Charlanne Groom, MD  SUMAtriptan  (IMITREX ) 100 MG tablet Take 100 mg by mouth every 2 (two) hours as needed for migraine. May repeat in 2 hours if headache persists or recurs.    [provider]    PRN MEDs: ALPRAZolam , bisacodyl , hydrALAZINE , HYDROmorphone  (DILAUDID ) injection, ipratropium, ondansetron  **OR** ondansetron  (ZOFRAN ) IV, oxyCODONE , promethazine -dextromethorphan , senna-docusate, sodium phosphate , SUMAtriptan , traZODone   Past Medical History:  Diagnosis Date   Anemia    Atrophic vaginitis 12/26/2019   Formatting of this note might be different from the original. Follows along with urology uses bioidentical hormone   Benign essential hypertension 10/08/2015   Chest pain in adult 07/02/2015   Formatting of this note might be different from the original. 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   Hypertension    Insomnia 11/27/2013   Iron  deficiency 12/30/2020   Iron  deficiency anemia 12/29/2020   Lumbar spondylosis 05/21/2020   Malaise and fatigue 05/25/2018   Mixed hyperlipidemia    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   Morbid obesity (HCC)    Morbid obesity with BMI of 40.0-44.9, adult (HCC) 11/17/2016   Peripheral edema    PONV (postoperative nausea and vomiting)    Popliteal pain 07/23/2019   Pre-diabetes    Primary hypothyroidism 10/08/2015   Restless leg syndrome    Sleep apnea    CPAP   Spinal headache    from epidural during childbirth   Spinal stenosis at L4-L5 level    Spondylolisthesis,  grade 2 11/27/2013   Formatting of this note might be different from the original. L3/4 7.2 mm with L4/5 mod to severe spinal stenosis, L5/S1 with slight disc on the right Formatting of this note might be different from the original. 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 She has never used smokeless tobacco. She reports current alcohol use of about 6.0 standard drinks of alcohol per week. She reports that she does not use drugs.   Family History  Problem Relation Age of Onset   Cancer - Prostate Father    Esophageal cancer Neg Hx    Colon cancer Neg Hx    Rectal cancer Neg Hx    Stomach cancer Neg Hx     Physical Exam:   Vitals:   07/09/24 1315 07/09/24 1330 07/09/24 1415 07/09/24 1427  BP: (!) 150/113 (!) 119/98    Pulse: (!) 103 (!) 101 (!) 107 92  Resp: (!) 25 (!) 22 19 (!) 21  Temp:      TempSrc:      SpO2: 92% (!) 83% (S) (!) 79% 92%   Constitutional: NAD, calm, comfortable Eyes: PERRL, lids and conjunctivae normal ENMT: Mucous membranes are moist. Posterior pharynx clear of any exudate or lesions.Normal dentition.  Neck: normal, supple, no masses, no thyromegaly Respiratory: clear to auscultation bilaterally, no wheezing, no crackles. Normal respiratory effort. No accessory muscle use.  Cardiovascular: Regular rate and rhythm, no murmurs / rubs / gallops. No extremity edema. 2+ pedal pulses. No carotid bruits.  Abdomen: no tenderness, no masses palpated. No hepatosplenomegaly. Bowel sounds positive.  Musculoskeletal: no clubbing / cyanosis. No joint deformity upper and lower extremities. Good ROM, no contractures. Normal muscle tone.  Neurologic: CN II-XII grossly intact. Sensation intact, DTR normal. Strength 5/5 in all 4.  Psychiatric: Normal judgment and insight. Alert and oriented x 3. Normal mood.  Skin: no rashes, lesions, ulcers. No induration      Labs on admission:    I have personally reviewed following labs and imaging studies  CBC: Recent Labs  Lab 07/09/24 1122 07/09/24 1135 07/09/24 1136  WBC 8.1  --   --   NEUTROABS 6.5  --   --   HGB 7.9* 9.9* 9.9*  HCT 28.2* 29.0* 29.0*   MCV 70.9*  --   --   PLT 330  --   --    Basic Metabolic Panel: Recent Labs  Lab 07/09/24 1122 07/09/24 1135 07/09/24 1136  NA 134* 134* 134*  K 3.4* 3.4* 3.4*  CL 95*  --  93*  CO2 30  --   --   GLUCOSE 90  --  91  BUN 8  --  9  CREATININE 0.69  --  0.80  CALCIUM  8.6*  --   --    GFR: Estimated Creatinine Clearance: 77.7 mL/min (by C-G formula based on SCr of 0.8 mg/dL). Liver Function Tests: Recent Labs  Lab 07/09/24 1122  AST 25  ALT 16  ALKPHOS 81  BILITOT 0.3  PROT 6.5  ALBUMIN  3.6   No results for input(s): LIPASE, AMYLASE in the last 168 hours. No results for input(s): AMMONIA in the last 168 hours. Coagulation Profile: Recent Labs  Lab 07/09/24 1122  INR 1.2    Urine analysis:    Component Value Date/Time   COLORURINE STRAW (A) 12/08/2020 1108   APPEARANCEUR CLEAR 12/08/2020 1108   LABSPEC 1.006 12/08/2020 1108   PHURINE 7.0 12/08/2020 1108   GLUCOSEU NEGATIVE 12/08/2020 1108   HGBUR NEGATIVE 12/08/2020 1108   BILIRUBINUR NEGATIVE 12/08/2020 1108   KETONESUR NEGATIVE 12/08/2020 1108   PROTEINUR NEGATIVE 12/08/2020 1108   NITRITE NEGATIVE 12/08/2020 1108   LEUKOCYTESUR NEGATIVE 12/08/2020 1108    Last A1C:  Lab Results  Component Value Date   HGBA1C 4.9 03/17/2023     Radiologic Exams on Admission:   CT Angio Chest PE W and/or Wo Contrast Result Date: 07/09/2024 EXAM: CTA of the Chest with contrast for PE 07/09/2024 01:59:46 PM TECHNIQUE: CTA of the chest was performed without and with the administration of 75 mL of iohexol  (OMNIPAQUE ) 350 MG/ML injection. Multiplanar reformatted images are provided for review. MIP images are provided for review. Automated exposure control, iterative reconstruction, and/or weight based adjustment of the mA/kV was utilized to reduce the radiation dose to as low as reasonably achievable. Mild motion artifact is present. COMPARISON: Chest radiograph 07/09/2024. CLINICAL HISTORY: Pulmonary embolism (PE)  suspected, high prob. FINDINGS: PULMONARY ARTERIES: Good opacification of the central and segmental pulmonary arteries. No focal filling defects. No evidence of significant pulmonary embolus. Main pulmonary artery is normal in caliber. MEDIASTINUM: Normal heart size. No pericardial effusions. Normal caliber thoracic aorta. No dissection. Calcification of the aorta and coronary arteries. Moderate-sized esophageal hiatal hernia. The esophagus is decompressed. LYMPH NODES: No significant lymphadenopathy. LUNGS AND PLEURA: Patchy infiltrates demonstrated in the right upper lung, right middle lung, and left lingula. These may represent multifocal pneumonia or aspiration. There is evidence of opacification in lower lobe bronchi with bronchial wall thickening, possibly mucous plugging or aspiration. No pleural effusion or pneumothorax. UPPER ABDOMEN: 1.6 cm diameter left adrenal gland nodule with attenuation measurements of 2.7 cm. SOFT TISSUES AND BONES: Spinal cord stimulator leads in the thoracic spine. Degenerative changes in the spine. No acute soft tissue abnormality. IMPRESSION: 1. No evidence of pulmonary embolism. 2. Patchy infiltrates in the right upper lung, right middle lung, and left lingula, possibly representing multifocal pneumonia or aspiration. 3. Findings suggestive of lower lobe airway mucus plugging or aspiration. 4. Moderate-sized esophageal hiatal hernia with decompressed esophagus. 5. Aortic and coronary artery calcifications. Electronically signed by: Elsie Gravely MD 07/09/2024 02:17 PM EST RP Workstation: [id number]   DG Chest 1 View Result Date: 07/09/2024 CLINICAL DATA:  Sepsis. EXAM: DG CHEST 1V COMPARISON:  Chest radiograph dated 07/08/2024 FINDINGS: No focal consolidation, pleural effusion or pneumothorax. There is mild cardiomegaly with mild vascular congestion. Moderate size hiatal hernia. Spinal stimulator. No acute osseous pathology. IMPRESSION: 1. Mild cardiomegaly with mild  vascular congestion. No focal consolidation. 2. Moderate size hiatal hernia. Electronically Signed  By: Vanetta Chou M.D.   On: 07/09/2024 10:55   DG Chest 2 View Result Date: 07/08/2024 EXAM: 2 VIEW(S) XRAY OF THE CHEST 07/08/2024 04:05:00 PM COMPARISON: None available. CLINICAL HISTORY: cough FINDINGS: LINES, TUBES AND DEVICES: Spinal stimulator in place. LUNGS AND PLEURA: Hazy interstitial opacity at right lung base. No pleural effusion. No pneumothorax. HEART AND MEDIASTINUM: No acute abnormality of the cardiac and mediastinal silhouettes. BONES AND SOFT TISSUES: No acute osseous abnormality. IMPRESSION: 1. Hazy interstitial opacity at the right lung base, which may reflect atelectasis versus early pneumonitis. Electronically signed by: Greig Pique MD 07/08/2024 04:32 PM EST RP Workstation: [id number]    EKG:   Independently reviewed.  Orders placed or performed during the hospital encounter of 07/09/24   ED EKG   ED EKG   EKG 12-Lead   ---------------------------------------------------------------------------------------------------------------------------------------    Assessment / Plan:   Principal Problem:   Acute hypoxic respiratory failure (HCC) Active Problems:   Depression   Benign essential hypertension   Iron  deficiency anemia   Influenza A with pneumonia   COVID-19 virus infection   GERD without esophagitis   Mixed hyperlipidemia   Restless leg syndrome   Pre-diabetes   Morbid obesity with BMI of 40.0-44.9, adult (HCC)   Assessment and Plan: * Acute hypoxic respiratory failure (HCC) Acute respiratory failure, secondary to influenza A, SARS-CoV-2 infection CT angiogram reviewed, no acute pulmonary embolism, multifocal pneumonia Pneumonia -likely viral pneumonia Given the risk factors-initiating empiric antibiotics, patient has been on doxycycline ,will switch azithromycin  plus Rocephin  -with plan to quickly de-escalate  - Obtaining procalcitonin  level - Lactic acid 0.6, WBC 8.1 -Will follow the blood cultures, sputum cultures accordingly -Continue with IV steroid - Encourage incentive spirometer, flutter valve - Continue supplemental oxygen, maintaining O2 sat greater 92% - DuoNeb bronchodilators scheduled and as needed  COVID-19 virus infection - Continue with supportive therapy - IV steroids, mucolytics, - Will encourage use of flutter valve, incentive spirometer, - With inflammatory markers including CRP, - Consulting RT for pulmonary toiletry, nebs, scheduled and as needed  - ID consulted, recommended remdesivir  x 3 days  Influenza A with pneumonia - Continue with Tamiflu , of care -Treating pneumonia with empiric antibiotics - ID Consulted, recommended to continue Tamiflu    Iron  deficiency anemia Anemia of chronic disease including iron  deficiency anemia -Monitoring H&H closely, continue iron  supplement  Benign essential hypertension Monitoring closely, resuming home medications including Lasix   Depression Mood stable, home medication reviewed currently on Wellbutrin , Klonopin , and Prozac   Mixed hyperlipidemia Continue statins  GERD without esophagitis Continue PPI  Restless leg syndrome Review home medication resuming meds accordingly - Continue home medication of Mirapex   Morbid obesity with BMI of 40.0-44.9, adult (HCC) Still waiting 116.7 kg, with a height of 5 6 -Repeating weight, calculate BMI -Discussed and counseled regarding diet exercise, follow-up with PCP regarding weight loss programs  Pre-diabetes Anticipating hyperglycemia with IV steroids -Check her CBG q. ACHS, SSI coverage -Last A1c 2 months ago 5.4    Consults called: ID Dr. CHRISTELLA Stank -------------------------------------------------------------------------------------------------------------------------------------------- DVT prophylaxis:  heparin  injection 5,000 Units Start: 07/09/24 1500 SCDs Start: 07/09/24 1448   Code  Status:   Code Status: Full Code   Admission status: Patient will be admitted as Inpatient, with a greater than 2 midnight length of stay. Level of care: Telemetry   Family Communication:  none at bedside  (The above findings and plan of care has been discussed with patient in detail, the patient expressed understanding and agreement of above plan)  --------------------------------------------------------------------------------------------------------------------------------------------------  Disposition Plan:  Anticipated 1-2 days Status is: Inpatient -Meeting inpatient criteria, with acute hypoxia, respiratory failure, influenza A, COVID, needing IV remdesivir , IV steroids, nebs supplemental oxygen      ----------------------------------------------------------------------------------------------------------------------------------------------------  Time spent:  42  Min.  Was spent seeing and evaluating the patient, reviewing all medical records, drawn plan of care.  SIGNED: Adriana DELENA Grams, MD, FHM. FAAFP. [facility], Pager  (Please use [email or web address] to page/ or secure chat through epic) If 7PM-7AM, please contact night-coverage [email or web address],  07/09/2024, 4:08 PM     [1]  Allergies Allergen Reactions   Bee Venom Anaphylaxis   Feldene [Piroxicam] Other (See Comments)    unknown   Naphazoline Other (See Comments)    Other Reaction(s): Not available  naphazoline hydrochloride   Cephalosporins Rash   Flexeril [Cyclobenzaprine] Nausea Only   Keflex [Cephalexin] Rash

## 2024-07-09 NOTE — Assessment & Plan Note (Signed)
 Anemia of chronic disease including iron  deficiency anemia -Monitoring H&H closely, continue iron  supplement

## 2024-07-09 NOTE — Assessment & Plan Note (Signed)
 Still waiting 116.7 kg, with a height of 5 6 -Repeating weight, calculate BMI -Discussed and counseled regarding diet exercise, follow-up with PCP regarding weight loss programs

## 2024-07-09 NOTE — Assessment & Plan Note (Signed)
 Monitoring closely, resuming home medications including Lasix 

## 2024-07-09 NOTE — Progress Notes (Signed)
 [facility] night cross cover note:   I was notified by the patient's RN as well as our RPH that upper extremity IV infiltrated with calcium  gluconate. Per my discussions with Dorn Poot, [facility], will proceed with compresses and Hyaluronidase .     Eva Pore, DO Hospitalist

## 2024-07-09 NOTE — ED Provider Notes (Signed)
 [facility]MENT AT [facility] Provider Note   CSN: [id number] Arrival date & time: 07/09/24  9047     Patient presents with: Shortness of Breath   Kendra [name] is a 78 y.o. female.    Shortness of Breath Associated symptoms: cough and headaches   Patient presents for shortness of breath.  Medical history includes anemia, GERD, depression, obesity, HLD, RLS, HTN.  She was seen urgent care yesterday with 2 days of complaints of cough, shortness of breath, nasal congestion.  X-ray showed concern of right lower lobe pneumonia.  She was given a shot of Depo-Medrol  and started on doxycycline .  She return to urgent care today for shortness of breath.  At this time, she was found to be febrile and hypoxic.  She was placed on supplemental oxygen and transported to ED via EMS.  Patient currently endorses headache and ongoing shortness of breath.  She denies any other areas of discomfort     Prior to Admission medications  Medication Sig Start Date End Date Taking? Authorizing Provider  albuterol  (VENTOLIN  HFA) 108 (90 Base) MCG/ACT inhaler Inhale 2 puffs into the lungs every 4 (four) hours as needed for wheezing or shortness of breath. 07/08/24   Ival Domino, FNP  ALPRAZolam  (XANAX ) 0.5 MG tablet Take 0.5 mg by mouth. 04/25/24   [provider]  buPROPion  (WELLBUTRIN  XL) 150 MG 24 hr tablet 1 tablet in the morning Orally Once a day; Duration: 90 days 08/05/23   [provider]  clonazePAM  (KLONOPIN ) 0.5 MG tablet Take 1 mg by mouth at bedtime.    [provider]  doxycycline  (VIBRAMYCIN ) 100 MG capsule Take 1 capsule (100 mg total) by mouth 2 (two) times daily for 10 days. 07/08/24 07/18/24  Ival Domino, FNP  EPINEPHrine  0.3 mg/0.3 mL IJ SOAJ injection Inject 0.3 mg into the muscle as needed for anaphylaxis. 03/01/22   [provider]  FLUoxetine  (PROZAC ) 20 MG capsule Take 20 mg by mouth daily. 10/09/20   [provider]   furosemide  (LASIX ) 40 MG tablet Take 40 mg by mouth daily as needed for edema.    [provider]  glycopyrrolate  (ROBINUL ) 1 MG tablet Take 1 tablet (1 mg total) by mouth 2 (two) times daily as needed. Oral; Duration: 90 Days    [provider]  latanoprost  (XALATAN ) 0.005 % ophthalmic solution Place 1 drop into both eyes at bedtime. 08/03/22   [provider]  levothyroxine  (SYNTHROID ) 88 MCG tablet Take 88 mcg by mouth daily before breakfast.    [provider]  omeprazole  (PRILOSEC) 20 MG capsule Take 1 capsule (20 mg total) by mouth 2 (two) times daily before a meal. 06/28/24   May, Deanna J, NP  oxyCODONE  (OXY IR/ROXICODONE ) 5 MG immediate release tablet Take 1-2 tablets (5-10 mg total) by mouth every 4 (four) hours as needed for moderate pain (pain score 4-6). 03/21/23   Rosario Leatrice FERNS, MD  pantoprazole  (PROTONIX ) 40 MG tablet Take 1 tablet (40 mg total) by mouth 2 (two) times daily. 06/29/24   Charlanne Groom, MD  pramipexole  (MIRAPEX ) 1 MG tablet Take 2 mg by mouth 3 (three) times daily.    [provider]  promethazine -dextromethorphan  (PROMETHAZINE -DM) 6.25-15 MG/5ML syrup Take 5 mLs by mouth 4 (four) times daily as needed for cough. Do not use and drive - May make drowsy. 07/08/24   Ival Domino, FNP  Spacer/Aero-Holding Chambers (COMPACT SPACE CHAMBER) DEVI Use with the albuterol  inhaler 07/08/24  Ival Domino, FNP  sucralfate  (CARAFATE ) 1 GM/10ML suspension 1 gram 4 times daily for 2 weeks. 06/29/24   Charlanne Groom, MD  SUMAtriptan  (IMITREX ) 100 MG tablet Take 100 mg by mouth every 2 (two) hours as needed for migraine. May repeat in 2 hours if headache persists or recurs.    [provider]    Allergies: Bee venom, Feldene [piroxicam], Naphazoline, Cephalosporins, Flexeril [cyclobenzaprine], and Keflex [cephalexin]    Review of Systems  Constitutional:  Positive for fatigue.  Respiratory:  Positive for cough and shortness of  breath.   Neurological:  Positive for headaches.  All other systems reviewed and are negative.   Updated Vital Signs BP (!) 119/98   Pulse 92   Temp 98.2 F (36.8 C) (Oral)   Resp (!) 21   SpO2 92%   Physical Exam Vitals and nursing note reviewed.  Constitutional:      General: She is not in acute distress.    Appearance: She is well-developed. She is not ill-appearing, toxic-appearing or diaphoretic.  HENT:     Head: Normocephalic and atraumatic.  Eyes:     Conjunctiva/sclera: Conjunctivae normal.  Cardiovascular:     Rate and Rhythm: Regular rhythm. Tachycardia present.     Heart sounds: No murmur heard. Pulmonary:     Effort: Pulmonary effort is normal. Tachypnea present. No respiratory distress.     Breath sounds: Wheezing present.  Abdominal:     Palpations: Abdomen is soft.     Tenderness: There is no abdominal tenderness.  Musculoskeletal:        General: No swelling.     Cervical back: Normal range of motion and neck supple.  Skin:    General: Skin is warm and dry.     Coloration: Skin is not cyanotic or pale.  Neurological:     General: No focal deficit present.     Mental Status: She is alert and oriented to person, place, and time.  Psychiatric:        Mood and Affect: Mood normal.        Behavior: Behavior normal.     (all labs ordered are listed, but only abnormal results are displayed) Labs Reviewed  RESP PANEL BY RT-PCR (RSV, FLU A&B, COVID)  RVPGX2 - Abnormal; Notable for the following components:      Result Value   SARS Coronavirus 2 by RT PCR POSITIVE (*)    Influenza A by PCR POSITIVE (*)    All other components within normal limits  COMPREHENSIVE METABOLIC PANEL WITH GFR - Abnormal; Notable for the following components:   Sodium 134 (*)    Potassium 3.4 (*)    Chloride 95 (*)    Calcium  8.6 (*)    All other components within normal limits  CBC WITH DIFFERENTIAL/PLATELET - Abnormal; Notable for the following components:   Hemoglobin 7.9  (*)    HCT 28.2 (*)    MCV 70.9 (*)    MCH 19.8 (*)    MCHC 28.0 (*)    RDW 19.9 (*)    nRBC 0.4 (*)    Lymphs Abs 0.5 (*)    Abs Immature Granulocytes 0.19 (*)    All other components within normal limits  PROTIME-INR - Abnormal; Notable for the following components:   Prothrombin Time 16.0 (*)    All other components within normal limits  BRAIN NATRIURETIC PEPTIDE - Abnormal; Notable for the following components:   B Natriuretic Peptide 191.8 (*)    All other components within  normal limits  I-STAT VENOUS BLOOD GAS, ED - Abnormal; Notable for the following components:   Bicarbonate 32.7 (*)    TCO2 34 (*)    Acid-Base Excess 6.0 (*)    Sodium 134 (*)    Potassium 3.4 (*)    Calcium , Ion 1.11 (*)    HCT 29.0 (*)    Hemoglobin 9.9 (*)    All other components within normal limits  I-STAT CHEM 8, ED - Abnormal; Notable for the following components:   Sodium 134 (*)    Potassium 3.4 (*)    Chloride 93 (*)    Calcium , Ion 1.10 (*)    Hemoglobin 9.9 (*)    HCT 29.0 (*)    All other components within normal limits  CULTURE, BLOOD (ROUTINE X 2)  CULTURE, BLOOD (ROUTINE X 2)  I-STAT CG4 LACTIC ACID, ED  I-STAT CG4 LACTIC ACID, ED    EKG: None  Radiology: CT Angio Chest PE W and/or Wo Contrast Result Date: 07/09/2024 EXAM: CTA of the Chest with contrast for PE 07/09/2024 01:59:46 PM TECHNIQUE: CTA of the chest was performed without and with the administration of 75 mL of iohexol  (OMNIPAQUE ) 350 MG/ML injection. Multiplanar reformatted images are provided for review. MIP images are provided for review. Automated exposure control, iterative reconstruction, and/or weight based adjustment of the mA/kV was utilized to reduce the radiation dose to as low as reasonably achievable. Mild motion artifact is present. COMPARISON: Chest radiograph 07/09/2024. CLINICAL HISTORY: Pulmonary embolism (PE) suspected, high prob. FINDINGS: PULMONARY ARTERIES: Good opacification of the central and  segmental pulmonary arteries. No focal filling defects. No evidence of significant pulmonary embolus. Main pulmonary artery is normal in caliber. MEDIASTINUM: Normal heart size. No pericardial effusions. Normal caliber thoracic aorta. No dissection. Calcification of the aorta and coronary arteries. Moderate-sized esophageal hiatal hernia. The esophagus is decompressed. LYMPH NODES: No significant lymphadenopathy. LUNGS AND PLEURA: Patchy infiltrates demonstrated in the right upper lung, right middle lung, and left lingula. These may represent multifocal pneumonia or aspiration. There is evidence of opacification in lower lobe bronchi with bronchial wall thickening, possibly mucous plugging or aspiration. No pleural effusion or pneumothorax. UPPER ABDOMEN: 1.6 cm diameter left adrenal gland nodule with attenuation measurements of 2.7 cm. SOFT TISSUES AND BONES: Spinal cord stimulator leads in the thoracic spine. Degenerative changes in the spine. No acute soft tissue abnormality. IMPRESSION: 1. No evidence of pulmonary embolism. 2. Patchy infiltrates in the right upper lung, right middle lung, and left lingula, possibly representing multifocal pneumonia or aspiration. 3. Findings suggestive of lower lobe airway mucus plugging or aspiration. 4. Moderate-sized esophageal hiatal hernia with decompressed esophagus. 5. Aortic and coronary artery calcifications. Electronically signed by: Elsie Gravely MD 07/09/2024 02:17 PM EST RP Workstation: [id number]   DG Chest 1 View Result Date: 07/09/2024 CLINICAL DATA:  Sepsis. EXAM: DG CHEST 1V COMPARISON:  Chest radiograph dated 07/08/2024 FINDINGS: No focal consolidation, pleural effusion or pneumothorax. There is mild cardiomegaly with mild vascular congestion. Moderate size hiatal hernia. Spinal stimulator. No acute osseous pathology. IMPRESSION: 1. Mild cardiomegaly with mild vascular congestion. No focal consolidation. 2. Moderate size hiatal hernia. Electronically  Signed   By: Vanetta Chou M.D.   On: 07/09/2024 10:55   DG Chest 2 View Result Date: 07/08/2024 EXAM: 2 VIEW(S) XRAY OF THE CHEST 07/08/2024 04:05:00 PM COMPARISON: None available. CLINICAL HISTORY: cough FINDINGS: LINES, TUBES AND DEVICES: Spinal stimulator in place. LUNGS AND PLEURA: Hazy interstitial opacity at right lung base. No pleural effusion. No  pneumothorax. HEART AND MEDIASTINUM: No acute abnormality of the cardiac and mediastinal silhouettes. BONES AND SOFT TISSUES: No acute osseous abnormality. IMPRESSION: 1. Hazy interstitial opacity at the right lung base, which may reflect atelectasis versus early pneumonitis. Electronically signed by: Greig Pique MD 07/08/2024 04:32 PM EST RP Workstation: [id number]     Procedures   Medications Ordered in the ED  lactated ringers  infusion ( Intravenous New Bag/Given 07/09/24 1138)  oseltamivir  (TAMIFLU ) capsule 75 mg (has no administration in time range)  levofloxacin  (LEVAQUIN ) IVPB 750 mg (0 mg Intravenous Stopped 07/09/24 1312)  methylPREDNISolone  sodium succinate (SOLU-MEDROL ) 125 mg/2 mL injection 125 mg (125 mg Intravenous Given 07/09/24 1154)  ipratropium-albuterol  (DUONEB) 0.5-2.5 (3) MG/3ML nebulizer solution 3 mL (3 mLs Nebulization Given 07/09/24 1147)  ipratropium-albuterol  (DUONEB) 0.5-2.5 (3) MG/3ML nebulizer solution 3 mL (3 mLs Nebulization Given 07/09/24 1147)  ipratropium-albuterol  (DUONEB) 0.5-2.5 (3) MG/3ML nebulizer solution 3 mL (3 mLs Nebulization Given 07/09/24 1147)  SUMAtriptan  (IMITREX ) injection 6 mg (6 mg Subcutaneous Given 07/09/24 1143)  iohexol  (OMNIPAQUE ) 350 MG/ML injection 75 mL (75 mLs Intravenous Contrast Given 07/09/24 1400)                                    Medical Decision Making Amount and/or Complexity of Data Reviewed Labs: ordered. Radiology: ordered.  Risk Prescription drug management. Decision regarding hospitalization.   This patient presents to the ED for concern of shortness of  breath, this involves an extensive number of treatment options, and is a complaint that carries with it a high risk of complications and morbidity.  The differential diagnosis includes pneumonia, PE, reactive airway disease, CHF, anemia, acidosis   Co morbidities / Chronic conditions that complicate the patient evaluation  anemia, GERD, depression, obesity, HLD, RLS, HTN   Additional history obtained:  Additional history obtained from EMR External records from outside source obtained and reviewed including N/A   Lab Tests:  I Ordered, and personally interpreted labs.  The pertinent results include: Baseline anemia, no leukocytosis, normal kidney function, normal electrolytes, slight elevation in BNP.  Patient tested positive for both COVID-19 and influenza A.   Imaging Studies ordered:  I ordered imaging studies including chest x-ray, CTA chest I independently visualized and interpreted imaging which showed multifocal pneumonia I agree with the radiologist interpretation   Cardiac Monitoring: / EKG:  The patient was maintained on a cardiac monitor.  I personally viewed and interpreted the cardiac monitored which showed an underlying rhythm of: Sinus rhythm   Problem List / ED Course / Critical interventions / Medication management  Patient presenting for worsening cough and shortness of breath over the past 3 days.  She has been seen at urgent care yesterday and today.  She was found to be hypoxic today.  On arrival in the ED, patient remains hypoxic on room air.  She does not wear oxygen at baseline.  On exam, she is overall well-appearing.  She is able to speak in complete sentences.  On lung auscultation, she does have expiratory wheezing present.  Patient was treated with DuoNeb and Solu-Medrol .  She does endorse a headache and states that Imitrex  is what works for her headaches.  Dose of Imitrex  was ordered.  Given concern of pneumonia from urgent care x-ray, patient was started  on antibiotics.  Will obtain CTA of chest as well.  Patient ended up testing positive for influenza A as well as COVID-19.  Dose  of Tamiflu  ordered.  Her CT of chest showed multifocal pneumonia.  On reassessment, patient resting comfortably.  She is currently on 4 L of supplemental oxygen with SpO2 of 92%.  Patient was admitted for further management. I ordered medication including Solu-Medrol  and DuoNeb for wheezing; Imitrex  for headache; Levaquin  for pneumonia; oseltamavir for influenza A Reevaluation of the patient after these medicines showed that the patient improved I have reviewed the patients home medicines and have made adjustments as needed  Social Determinants of Health:  Lives independently  CRITICAL CARE Performed by: Bernardino Fireman   Total critical care time: 32 minutes  Critical care time was exclusive of separately billable procedures and treating other patients.  Critical care was necessary to treat or prevent imminent or life-threatening deterioration.  Critical care was time spent personally by me on the following activities: development of treatment plan with patient and/or surrogate as well as nursing, discussions with consultants, evaluation of patient's response to treatment, examination of patient, obtaining history from patient or surrogate, ordering and performing treatments and interventions, ordering and review of laboratory studies, ordering and review of radiographic studies, pulse oximetry and re-evaluation of patient's condition.     Final diagnoses:  COVID-19  Influenza A  Multifocal pneumonia  Acute respiratory failure with hypoxia [facility])    ED Discharge Orders     None          Fireman Bernardino, MD 07/09/24 1433

## 2024-07-09 NOTE — Sepsis Progress Note (Signed)
 Elink will follow per sepsis protocol.

## 2024-07-09 NOTE — Assessment & Plan Note (Signed)
 Continue statins

## 2024-07-09 NOTE — ED Notes (Addendum)
 Patient saturation went from 90 to 78, and respiratory call to assess patient. RN increase the oxygen to 5 liters the patient saturation began satting went to 93.

## 2024-07-09 NOTE — ED Provider Notes (Signed)
 Kendra [name]    CSN: [id number] Arrival date & time: 07/09/24  0830      History   Chief Complaint Chief Complaint  Patient presents with   Shortness of Breath    HPI Kendra [name] is a 78 y.o. female.   Patient is a 78 year old female that returns here today for shortness of breath.  She was seen here yesterday and diagnosed with pneumonia and treated.  She became more short of breath overnight.  Upon arrival here today she is hypoxic with sats in this 78% and a temp of 101.6.  She is having decreased air movement to her lungs and tachypneic.  Some mild confusion.   Shortness of Breath   Past Medical History:  Diagnosis Date   Anemia    Atrophic vaginitis 12/26/2019   Formatting of this note might be different from the original. Follows along with urology uses bioidentical hormone   Benign essential hypertension 10/08/2015   Chest pain in adult 07/02/2015   Formatting of this note might be different from the original. 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 L3/4 7.2 mm with L4/5 mod to severe spinal stenosis, L5/S1 with slight disc on the right Formatting of this note might be different from the original. Overview:  L3/4 7.2 mm with L4/5 mod to severe spinal stenosis, L5/S1 with slight disc on the right   Valvular heart disease 07/15/2021    Patient Active Problem List   Diagnosis Date Noted   Left hip pain 03/17/2023   S/P total left hip arthroplasty 03/01/2023   Dyspnea on exertion 08/03/2021   History of COVID-19 08/03/2021   Anemia 07/16/2021   Depression 07/16/2021   Degenerative disc disease, lumbar 07/16/2021   Hypertension 07/16/2021   Mixed hyperlipidemia 07/16/2021   Morbid obesity (HCC) 07/16/2021   Peripheral edema 07/16/2021   Pre-diabetes 07/16/2021   Restless leg syndrome 07/16/2021   Sleep apnea 07/16/2021   Spinal headache 07/16/2021   Spinal stenosis at L4-L5 level 07/16/2021   Valvular heart disease 07/15/2021   Iron  deficiency 12/30/2020   Iron  deficiency anemia 12/29/2020   Chronic pain 12/11/2020   Lumbar spondylosis 05/21/2020   Atrophic vaginitis 12/26/2019   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   Popliteal pain 07/23/2019   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   Malaise and fatigue 05/25/2018   Morbid  obesity with BMI of 40.0-44.9, adult (HCC) 11/17/2016   GERD without esophagitis 10/08/2015   Primary hypothyroidism 10/08/2015   Benign essential hypertension 10/08/2015   High risk medication use 10/08/2015   Chest pain in adult 07/02/2015   Insomnia 11/27/2013   Spondylolisthesis, grade 2 11/27/2013    Past Surgical History:  Procedure Laterality Date   ABDOMINAL HYSTERECTOMY     partial   CATARACT EXTRACTION Bilateral    CESAREAN SECTION     x2   RHINOPLASTY     SPINAL CORD STIMULATOR INSERTION N/A 12/11/2020   Procedure: SPINAL CORD STIMULATOR INSERTION;   Surgeon: Burnetta Aures, MD;  Location: [facility] OR;  Service: Orthopedics;  Laterality: N/A;   TONSILLECTOMY     TOTAL HIP ARTHROPLASTY Left 03/01/2023   Procedure: TOTAL HIP ARTHROPLASTY ANTERIOR APPROACH;  Surgeon: Ernie Cough, MD;  Location: [facility] ORS;  Service: Orthopedics;  Laterality: Left;   TOTAL HIP ARTHROPLASTY Left 03/20/2023   Procedure: TOTAL HIP ARTHROPLASTY ANTERIOR APPROACH;  Surgeon: Ernie Cough, MD;  Location: [facility] ORS;  Service: Orthopedics;  Laterality: Left;    OB History   No obstetric history on file.      Home Medications    Prior to Admission medications  Medication Sig Start Date End Date Taking? Authorizing Provider  albuterol  (VENTOLIN  HFA) 108 (90 Base) MCG/ACT inhaler Inhale 2 puffs into the lungs every 4 (four) hours as needed for wheezing or shortness of breath. 07/08/24   Ival Domino, FNP  ALPRAZolam  (XANAX ) 0.5 MG tablet Take 0.5 mg by mouth. 04/25/24   [provider]  buPROPion  (WELLBUTRIN  XL) 150 MG 24 hr tablet 1 tablet in the morning Orally Once a day; Duration: 90 days 08/05/23   [provider]  clonazePAM  (KLONOPIN ) 0.5 MG tablet Take 1 mg by mouth at bedtime.    [provider]  doxycycline  (VIBRAMYCIN ) 100 MG capsule Take 1 capsule (100 mg total) by mouth 2 (two) times daily for 10 days. 07/08/24 07/18/24  Ival Domino, FNP  EPINEPHrine  0.3 mg/0.3 mL IJ SOAJ injection Inject 0.3 mg into the muscle as needed for anaphylaxis. 03/01/22   [provider]  FLUoxetine  (PROZAC ) 20 MG capsule Take 20 mg by mouth daily. 10/09/20   [provider]  furosemide  (LASIX ) 40 MG tablet Take 40 mg by mouth daily as needed for edema.    [provider]  glycopyrrolate  (ROBINUL ) 1 MG tablet Take 1 tablet (1 mg total) by mouth 2 (two) times daily as needed. Oral; Duration: 90 Days    [provider]  latanoprost  (XALATAN ) 0.005 % ophthalmic solution Place 1 drop into both eyes at bedtime. 08/03/22   [provider]  levothyroxine  (SYNTHROID ) 88 MCG tablet Take 88 mcg by mouth daily before breakfast.    [provider]  omeprazole  (PRILOSEC) 20 MG capsule Take 1 capsule (20 mg total) by mouth 2 (two) times daily before a meal. 06/28/24   May, Deanna J, NP  oxyCODONE  (OXY IR/ROXICODONE ) 5 MG immediate release tablet Take 1-2 tablets (5-10 mg total) by mouth every 4 (four) hours as needed for moderate pain (pain score 4-6). 03/21/23   Rosario Leatrice FERNS, MD  pantoprazole  (PROTONIX ) 40 MG tablet Take 1 tablet (40 mg total) by mouth 2 (two) times daily. 06/29/24   Charlanne Groom, MD  pramipexole  (MIRAPEX ) 1 MG tablet Take 2 mg by mouth 3 (three) times daily.    [provider]  promethazine -dextromethorphan  (PROMETHAZINE -DM) 6.25-15 MG/5ML syrup Take 5 mLs by mouth 4 (  four) times daily as needed for cough. Do not use and drive - May make drowsy. 07/08/24   Ival Domino, FNP  Spacer/Aero-Holding Chambers (COMPACT SPACE CHAMBER) DEVI Use with the albuterol  inhaler 07/08/24   Jeanes, Sarah, FNP  sucralfate  (CARAFATE ) 1 GM/10ML suspension 1 gram 4 times daily for 2 weeks. 06/29/24   Charlanne Groom, MD  SUMAtriptan  (IMITREX ) 100 MG tablet Take 100 mg by mouth every 2 (two) hours as needed for migraine. May repeat in 2 hours if headache persists or recurs.    [provider]    Family History Family History  Problem Relation Age of Onset   Cancer - Prostate Father    Esophageal cancer Neg Hx    Colon cancer Neg Hx    Rectal cancer Neg Hx    Stomach cancer Neg Hx     Social History Social History[1]   Allergies   Bee venom, Feldene [piroxicam], Naphazoline, Cephalosporins, Flexeril [cyclobenzaprine], and Keflex [cephalexin]   Review of Systems Review of Systems  Respiratory:  Positive for shortness of breath.      Physical Exam Triage Vital Signs ED Triage Vitals  Encounter Vitals Group     BP 07/09/24 0905 (!) 140/68     Girls Systolic BP Percentile --       Girls Diastolic BP Percentile --      Boys Systolic BP Percentile --      Boys Diastolic BP Percentile --      Pulse Rate 07/09/24 0905 (!) 110     Resp 07/09/24 0905 (!) 26     Temp 07/09/24 0905 (!) 101.6 F (38.7 C)     Temp Source 07/09/24 0905 Oral     SpO2 07/09/24 0905 (!) 78 %     Weight --      Height --      Head Circumference --      Peak Flow --      Pain Score 07/09/24 0906 9     Pain Loc --      Pain Education --      Exclude from Growth Chart --    No data found.  Updated Vital Signs BP (!) 140/68 (BP Location: Right Arm)   Pulse (!) 110   Temp (!) 101.6 F (38.7 C) (Oral)   Resp (!) 26   SpO2 (!) 78%   Visual Acuity Right Eye Distance:   Left Eye Distance:   Bilateral Distance:    Right Eye Near:   Left Eye Near:    Bilateral Near:     Physical Exam Constitutional:      Appearance: She is ill-appearing.  Cardiovascular:     Rate and Rhythm: Tachycardia present.  Pulmonary:     Effort: Accessory muscle usage and respiratory distress present.     Breath sounds: Decreased breath sounds present.  Neurological:     Mental Status: She is alert.      UC Treatments / Results  Labs (all labs ordered are listed, but only abnormal results are displayed) Labs Reviewed - No data to display  EKG   Radiology DG Chest 2 View Result Date: 07/08/2024 EXAM: 2 VIEW(S) XRAY OF THE CHEST 07/08/2024 04:05:00 PM COMPARISON: None available. CLINICAL HISTORY: cough FINDINGS: LINES, TUBES AND DEVICES: Spinal stimulator in place. LUNGS AND PLEURA: Hazy interstitial opacity at right lung base. No pleural effusion. No pneumothorax. HEART AND MEDIASTINUM: No acute abnormality of the cardiac and mediastinal silhouettes. BONES AND SOFT TISSUES: No acute osseous abnormality. IMPRESSION:  1. Hazy interstitial opacity at the right lung base, which may reflect atelectasis versus early pneumonitis. Electronically signed by: Greig Pique MD 07/08/2024 04:32 PM EST RP  Workstation: [id number]    Procedures Procedures (including critical [name] time)  Medications Ordered in UC Medications - No data to display  Initial Impression / Assessment and Plan / UC Course  I have reviewed the triage vital signs and the nursing notes.  Pertinent labs & imaging results that were available during my [name] of the patient were reviewed by me and considered in my medical decision making (see chart for details).      Pneumonia with shortness of breath and hypoxia.  Patient returned here today after being diagnosed with pneumonia last night with worsening shortness of breath.  Upon arrival she is hypoxic with sats in the 70s.  She was placed on nasal cannula at 3 L which raised her oxygen levels up to 93%.  She is febrile here today and tachypneic with decreased lung sounds bilateral.  She is in acute respiratory distress.  Called 911 and she is taken to the hospital by EMS for further evaluation.  Final Clinical Impressions(s) / UC Diagnoses   Final diagnoses:  Hypoxia  SOB (shortness of breath)  Pneumonia of right lower lobe due to infectious organism   Discharge Instructions   None    ED Prescriptions   None    PDMP not reviewed this encounter.    [1]  Social History Tobacco Use   Smoking status: Former   Smokeless tobacco: Never  Vaping Use   Vaping status: Never Used  Substance Use Topics   Alcohol use: Yes    Alcohol/week: 6.0 standard drinks of alcohol    Types: 2 Glasses of wine, 2 Cans of beer, 2 Shots of liquor per week    Comment: 2 a day   Drug use: Never     Kendra Wilbert LABOR, FNP 07/09/24 [phone]

## 2024-07-09 NOTE — ED Triage Notes (Signed)
 Patient via EMS for eval of SOB x 3 days. Seen yesterday at [facility] and dx with PNA and started on doxycycline . SOB throughout the night so went back today. 84% SpO2 room air so placed on 3L O2 with improvement.

## 2024-07-09 NOTE — Assessment & Plan Note (Addendum)
 Acute respiratory failure, secondary to influenza A, SARS-CoV-2 infection CT angiogram reviewed, no acute pulmonary embolism, multifocal pneumonia Pneumonia -likely viral pneumonia Given the risk factors-initiating empiric antibiotics, patient has been on doxycycline ,will switch azithromycin  plus Rocephin  -with plan to quickly de-escalate  - Obtaining procalcitonin level - Lactic acid 0.6, WBC 8.1 -Will follow the blood cultures, sputum cultures accordingly -Continue with IV steroid - Encourage incentive spirometer, flutter valve - Continue supplemental oxygen, maintaining O2 sat greater 92% - DuoNeb bronchodilators scheduled and as needed

## 2024-07-10 ENCOUNTER — Encounter (HOSPITAL_COMMUNITY): Payer: Self-pay | Admitting: Family Medicine

## 2024-07-10 DIAGNOSIS — F32A Depression, unspecified: Secondary | ICD-10-CM | POA: Diagnosis present

## 2024-07-10 DIAGNOSIS — E039 Hypothyroidism, unspecified: Secondary | ICD-10-CM | POA: Diagnosis present

## 2024-07-10 DIAGNOSIS — J09X1 Influenza due to identified novel influenza A virus with pneumonia: Secondary | ICD-10-CM | POA: Diagnosis not present

## 2024-07-10 DIAGNOSIS — I5033 Acute on chronic diastolic (congestive) heart failure: Secondary | ICD-10-CM | POA: Diagnosis present

## 2024-07-10 DIAGNOSIS — I110 Hypertensive heart disease with heart failure: Secondary | ICD-10-CM | POA: Diagnosis present

## 2024-07-10 DIAGNOSIS — D509 Iron deficiency anemia, unspecified: Secondary | ICD-10-CM | POA: Diagnosis present

## 2024-07-10 DIAGNOSIS — R0602 Shortness of breath: Secondary | ICD-10-CM | POA: Diagnosis present

## 2024-07-10 DIAGNOSIS — R0603 Acute respiratory distress: Secondary | ICD-10-CM | POA: Diagnosis not present

## 2024-07-10 DIAGNOSIS — K219 Gastro-esophageal reflux disease without esophagitis: Secondary | ICD-10-CM | POA: Diagnosis present

## 2024-07-10 DIAGNOSIS — E876 Hypokalemia: Secondary | ICD-10-CM | POA: Diagnosis present

## 2024-07-10 DIAGNOSIS — U071 COVID-19: Secondary | ICD-10-CM | POA: Diagnosis present

## 2024-07-10 DIAGNOSIS — R5381 Other malaise: Secondary | ICD-10-CM | POA: Diagnosis present

## 2024-07-10 DIAGNOSIS — Z87891 Personal history of nicotine dependence: Secondary | ICD-10-CM | POA: Diagnosis not present

## 2024-07-10 DIAGNOSIS — J188 Other pneumonia, unspecified organism: Secondary | ICD-10-CM | POA: Diagnosis not present

## 2024-07-10 DIAGNOSIS — Z79890 Hormone replacement therapy (postmenopausal): Secondary | ICD-10-CM | POA: Diagnosis not present

## 2024-07-10 DIAGNOSIS — E66813 Obesity, class 3: Secondary | ICD-10-CM | POA: Diagnosis present

## 2024-07-10 DIAGNOSIS — J1008 Influenza due to other identified influenza virus with other specified pneumonia: Secondary | ICD-10-CM | POA: Diagnosis present

## 2024-07-10 DIAGNOSIS — Z96642 Presence of left artificial hip joint: Secondary | ICD-10-CM | POA: Diagnosis present

## 2024-07-10 DIAGNOSIS — Z6841 Body Mass Index (BMI) 40.0 and over, adult: Secondary | ICD-10-CM | POA: Diagnosis not present

## 2024-07-10 DIAGNOSIS — I10 Essential (primary) hypertension: Secondary | ICD-10-CM | POA: Diagnosis not present

## 2024-07-10 DIAGNOSIS — R7303 Prediabetes: Secondary | ICD-10-CM | POA: Diagnosis present

## 2024-07-10 DIAGNOSIS — G2581 Restless legs syndrome: Secondary | ICD-10-CM | POA: Diagnosis present

## 2024-07-10 DIAGNOSIS — Z8616 Personal history of COVID-19: Secondary | ICD-10-CM | POA: Diagnosis not present

## 2024-07-10 DIAGNOSIS — D638 Anemia in other chronic diseases classified elsewhere: Secondary | ICD-10-CM | POA: Diagnosis present

## 2024-07-10 DIAGNOSIS — J9601 Acute respiratory failure with hypoxia: Secondary | ICD-10-CM | POA: Diagnosis present

## 2024-07-10 DIAGNOSIS — Z79899 Other long term (current) drug therapy: Secondary | ICD-10-CM | POA: Diagnosis not present

## 2024-07-10 DIAGNOSIS — J1282 Pneumonia due to coronavirus disease 2019: Secondary | ICD-10-CM | POA: Diagnosis present

## 2024-07-10 DIAGNOSIS — G4733 Obstructive sleep apnea (adult) (pediatric): Secondary | ICD-10-CM | POA: Diagnosis present

## 2024-07-10 DIAGNOSIS — E782 Mixed hyperlipidemia: Secondary | ICD-10-CM | POA: Diagnosis present

## 2024-07-10 LAB — GLUCOSE, CAPILLARY
Glucose-Capillary: 133 mg/dL — ABNORMAL HIGH (ref 70–99)
Glucose-Capillary: 204 mg/dL — ABNORMAL HIGH (ref 70–99)

## 2024-07-10 LAB — CBC
HCT: 24.9 % — ABNORMAL LOW (ref 36.0–46.0)
Hemoglobin: 6.9 g/dL — CL (ref 12.0–15.0)
MCH: 20.1 pg — ABNORMAL LOW (ref 26.0–34.0)
MCHC: 27.7 g/dL — ABNORMAL LOW (ref 30.0–36.0)
MCV: 72.6 fL — ABNORMAL LOW (ref 80.0–100.0)
Platelets: 285 K/uL (ref 150–400)
RBC: 3.43 MIL/uL — ABNORMAL LOW (ref 3.87–5.11)
RDW: 19.9 % — ABNORMAL HIGH (ref 11.5–15.5)
WBC: 5.6 K/uL (ref 4.0–10.5)
nRBC: 0 % (ref 0.0–0.2)

## 2024-07-10 LAB — SEDIMENTATION RATE: Sed Rate: 23 mm/h — ABNORMAL HIGH (ref 0–22)

## 2024-07-10 LAB — BASIC METABOLIC PANEL WITH GFR
Anion gap: 9 (ref 5–15)
BUN: 10 mg/dL (ref 8–23)
CO2: 29 mmol/L (ref 22–32)
Calcium: 8.6 mg/dL — ABNORMAL LOW (ref 8.9–10.3)
Chloride: 101 mmol/L (ref 98–111)
Creatinine, Ser: 0.59 mg/dL (ref 0.44–1.00)
GFR, Estimated: >60 mL/min (ref >60)
Glucose, Bld: 164 mg/dL — ABNORMAL HIGH (ref 70–99)
Potassium: 4 mmol/L (ref 3.5–5.1)
Sodium: 139 mmol/L (ref 135–145)

## 2024-07-10 LAB — CBG MONITORING, ED
Glucose-Capillary: 175 mg/dL — ABNORMAL HIGH (ref 70–99)
Glucose-Capillary: 169 mg/dL — ABNORMAL HIGH (ref 70–99)

## 2024-07-10 LAB — C-REACTIVE PROTEIN: CRP: 11.4 mg/dL — ABNORMAL HIGH (ref <1.0)

## 2024-07-10 LAB — PROCALCITONIN: Procalcitonin: <0.1 ng/mL

## 2024-07-10 LAB — PREPARE RBC (CROSSMATCH)

## 2024-07-10 MED ORDER — SODIUM CHLORIDE 0.9% IV SOLUTION: Freq: Once | INTRAVENOUS | Status: AC

## 2024-07-10 NOTE — Evaluation (Signed)
 Physical Therapy Evaluation Patient Details Name: Kendra [name] MRN: [id number] DOB: 22-Mar-1946 Today's Date: 07/10/2024  History of Present Illness  Pt is a 78 yo female who presents 07/08/24 with shortness of breath and cough. Found to have acute respiratory failure due to influenza A, SARS-CoV-2, and multilobular pneumonia.   PHMx: anemia of chronic disease, GERD, depression, anxiety, hypothyroidism, HLD, RLS, HTN, GERD, obesity, HTN,chronic pain, spinal cord stimulator, DDD, and L THR.  Clinical Impression  Pt admitted with above diagnosis. Pt from home alone, independent, not on supplemental O2. Currently pt has SOB with all activity and desat to 87% on RA and even with 2L O2. Ambulated on 4L O2 with SPO2 87-94%, RR 44, HR 127 bpm. Pt required UE support for safe ambulation and does not usually need an AD at home.  Pt currently with functional limitations due to the deficits listed below (see PT Problem List). Pt will benefit from acute skilled PT to increase their independence and safety with mobility to allow discharge.  Recommend HHPT at d/c.          If plan is discharge home, recommend the following: Assist for transportation;Assistance with cooking/housework   Can travel by private vehicle        Equipment Recommendations None recommended by PT  Recommendations for Other Services       Functional Status Assessment Patient has had a recent decline in their functional status and demonstrates the ability to make significant improvements in function in a reasonable and predictable amount of time.     Precautions / Restrictions Precautions Precautions: Other (comment) Recall of Precautions/Restrictions: Intact Precaution/Restrictions Comments: watch HR, RR, and O2: covid+flu+PNA Restrictions Weight Bearing Restrictions Per Provider Order: No      Mobility  Bed Mobility Overal bed mobility: Independent             General bed mobility comments: increased WOB noted  with bed mobility, no physical assist needed    Transfers Overall transfer level: Needs assistance Equipment used: Pushed w/c Transfers: Sit to/from Stand Sit to Stand: Contact guard assist           General transfer comment: CGA for safety, pt mildly impulsive    Ambulation/Gait Ambulation/Gait assistance: Min assist Gait Distance (Feet): 150 Feet Assistive device: Pushed wheelchair Gait Pattern/deviations: Step-through pattern, Shuffle, Trunk flexed Gait velocity: WFL Gait velocity interpretation: >2.62 ft/sec, indicative of community ambulatory   General Gait Details: pt with quickening steps as she became more SOB and anxiety increased. Kept on 2 L O2 for gait with sats 97-94%, RR 44, HR 126 bpm.  Stairs            Wheelchair Mobility     Tilt Bed    Modified Rankin (Stroke Patients Only)       Balance Overall balance assessment: Needs assistance Sitting-balance support: No upper extremity supported, Feet supported Sitting balance-Leahy Scale: Good     Standing balance support: No upper extremity supported Standing balance-Leahy Scale: Fair Standing balance comment: limitations to dynamic balance                             Pertinent Vitals/Pain Pain Assessment Pain Assessment: No/denies pain    Home Living Family/patient expects to be discharged to:: Private residence Living Arrangements: Alone Available Help at Discharge: Family;Friend(s);Neighbor;Available PRN/intermittently Type of Home: House Home Access: Level entry       Home Layout: One level Home Equipment: Agricultural Consultant (2  wheels);BSC/3in1;Wheelchair - manual;Shower seat;Cane - single point Additional Comments: has good family and friend support but no one that can stay with her    Prior Function Prior Level of Function : Independent/Modified Independent;Driving             Mobility Comments: Has been using a SPC as of recently due to weakness ADLs Comments:  Independent     Extremity/Trunk Assessment   Upper Extremity Assessment Upper Extremity Assessment: Defer to OT evaluation    Lower Extremity Assessment Lower Extremity Assessment: Generalized weakness    Cervical / Trunk Assessment Cervical / Trunk Assessment: Normal  Communication   Communication Communication: No apparent difficulties    Cognition Arousal: Alert Behavior During Therapy: WFL for tasks assessed/performed   PT - Cognitive impairments: No apparent impairments                         Following commands: Intact       Cueing Cueing Techniques: Verbal cues     General Comments General comments (skin integrity, edema, etc.): Pt not on O2 PTA. Increased WOB with all activity. SPO2 87% on RA at rest    Exercises     Assessment/Plan    PT Assessment Patient needs continued PT services  PT Problem List Cardiopulmonary status limiting activity;Decreased activity tolerance;Decreased strength;Decreased mobility       PT Treatment Interventions DME instruction;Gait training;Functional mobility training;Therapeutic activities;Therapeutic exercise;Balance training;Patient/family education    PT Goals (Current goals can be found in the Care Plan section)  Acute Rehab PT Goals Patient Stated Goal: feel better PT Goal Formulation: With patient Time For Goal Achievement: 07/24/24 Potential to Achieve Goals: Good    Frequency Min 2X/week     Co-evaluation PT/OT/SLP Co-Evaluation/Treatment: Yes Reason for Co-Treatment: For patient/therapist safety;To address functional/ADL transfers PT goals addressed during session: Mobility/safety with mobility;Balance;Proper use of DME;Strengthening/ROM OT goals addressed during session: Strengthening/ROM;ADL's and self-care       AM-PAC PT 6 Clicks Mobility  Outcome Measure Help needed turning from your back to your side while in a flat bed without using bedrails?: None Help needed moving from lying on  your back to sitting on the side of a flat bed without using bedrails?: None Help needed moving to and from a bed to a chair (including a wheelchair)?: A Little Help needed standing up from a chair using your arms (e.g., wheelchair or bedside chair)?: A Little Help needed to walk in hospital room?: A Little Help needed climbing 3-5 steps with a railing? : A Lot 6 Click Score: 19    End of Session Equipment Utilized During Treatment: Gait belt;Oxygen Activity Tolerance: Treatment limited secondary to medical complications (Comment) (SOB) Patient left: in bed;with call bell/phone within reach Nurse Communication: Mobility status PT Visit Diagnosis: Muscle weakness (generalized) (M62.81);Difficulty in walking, not elsewhere classified (R26.2)    Time: 9067-9044 PT Time Calculation (min) (ACUTE ONLY): 23 min   Charges:   PT Evaluation $PT Eval Moderate Complexity: 1 Mod   PT General Charges $$ ACUTE PT VISIT: 1 Visit         Richerd Lipoma, PT  Acute Rehab Services Secure chat preferred Office [phone]   Richerd CROME Burke Terry 07/10/2024, 12:20 PM

## 2024-07-10 NOTE — Plan of Care (Signed)
°  Problem: Education: Goal: Knowledge of risk factors and measures for prevention of condition will improve Outcome: Progressing   Problem: Respiratory: Goal: Will maintain a patent airway Outcome: Progressing   Problem: Fluid Volume: Goal: Ability to maintain a balanced intake and output will improve Outcome: Progressing   Problem: Activity: Goal: Ability to tolerate increased activity will improve Outcome: Progressing   Problem: Clinical Measurements: Goal: Ability to maintain a body temperature in the normal range will improve Outcome: Progressing   Problem: Activity: Goal: Risk for activity intolerance will decrease Outcome: Progressing

## 2024-07-10 NOTE — Progress Notes (Addendum)
 PROGRESS NOTE    Kendra [name]  FMW:[id number] DOB: 1946/07/13 DOA: 07/09/2024 PCP: Benson [name], NP    Brief Narrative:  This 78 yrs old female with PMH significant for anemia of chronic disease, GERD, depression, anxiety, hypothyroidism, HLD, RLS, HTN, GERD, obesity presented in the ED with complaints of cough and shortness of breath.  Patient reports having URI symptoms  (cough, headache, shortness of breath, nasal congestion) started 2 days ago.  She was initially seen in the urgent care and was given a shot of steroids and doxycycline  but her symptoms persistently got worse.  Patient had a fever and became severely short of breath, went back to the urgent care and was sent in the ED.  She does not use oxygen at baseline.  She was hypoxic on arrival in the ED with SpO2 of 83% on room air requiring 4 L of supplemental oxygen to improve saturation to 95%.  She is influenza A+, COVID+, influenza B, RSV negative.  CTA chest ruled out acute pulmonary embolism but shows finding consistent with multilobar pneumonia.  Patient was admitted for further evaluation.  Assessment & Plan:   Principal Problem:   Acute hypoxic respiratory failure (HCC) Active Problems:   Depression   Benign essential hypertension   Iron  deficiency anemia   Influenza A with pneumonia   COVID-19 virus infection   GERD without esophagitis   Mixed hyperlipidemia   Restless leg syndrome   Pre-diabetes   Morbid obesity with BMI of 40.0-44.9, adult (HCC)  Acute hypoxic respiratory failure: Multifocal pneumonia, viral: Likely secondary to influenza A+, COVID + She does not use oxygen at home at baseline.  Now requiring 5 L to maintain saturation above 94%.  Wean as tolerated. CTA ruled out acute pulmonary embolism but shows finding consistent with multifocal pneumonia. Given his risk factors, initiated on empiric antibiotics. Continue ceftriaxone  and azithromycin . Lactic acid 0.6, WBC 8.1, procalcitonin  0.10 Follow-up blood cultures, sputum cultures. Continue with IV steroids. Encourage incentive spirometry, flutter valve. Continue DuoNeb nebulization scheduled and as needed. Continue airborne isolation. Continue to monitor inflammatory markers.  COVID 19 virus infection: Continue with supportive therapy Continue IV steroids, mucolytics D/w ID. Continue remdesivir  for 3 days.   Influenza A with pneumonia: Continue Tamiflu , treating pneumonia with empiric antibiotics.  Iron  deficiency anemia: Anemia of chronic disease: Hemoglobin dropped to 6.9.  No obvious visible bleeding Transfuse 1 unit PRBC.  Follow-up H&H.  Obtain stool for occult blood  Essential hypertension: Continue Lasix .  Continue hydralazine  as needed.   Depression: Mood stable, Continue Wellbutrin , Klonopin , and Prozac .   Mixed hyperlipidemia: Resume statin.   GERD without esophagitis: Continue PPI.   Restless leg syndrome: Continue Mirapex .   Morbid obesity with BMI of 40.0-44.9, adult (HCC) Still waiting 116.7 kg, with a height of 5 6 -Discussed and counseled regarding diet exercise, follow-up with PCP regarding weight loss programs   Pre-diabetes: Anticipating hyperglycemia with IV steroids -Check her CBG q. ACHS, SSI coverage -Last A1c 2 months ago 5.4    DVT prophylaxis: Heparin  sq Code Status: Full code Family Communication: No family at bed side Disposition Plan:    Status is: Observation The patient remains OBS appropriate and will d/c before 2 midnights.   Admitted for acute hypoxic respiratory failure secondary to COVID and influenza infection.  She is not medically ready for discharge.  Consultants:  None  Procedures: CTA Chest  Antimicrobials:  Anti-infectives (From admission, onward)    Start     Dose/Rate Route  Frequency Ordered Stop   07/10/24 1400  azithromycin  (ZITHROMAX ) 500 mg in dextrose  5 % 250 mL IVPB        500 mg 255 mL/hr over 60 Minutes Intravenous Every 24  hours 07/09/24 1516     07/10/24 1000  remdesivir  100 mg in sodium chloride  0.9 % 100 mL IVPB       Placed in Followed by Linked Group   100 mg 200 mL/hr over 30 Minutes Intravenous Daily 07/09/24 1518 07/12/24 0959   07/09/24 1530  cefTRIAXone  (ROCEPHIN ) 1 g in sodium chloride  0.9 % 100 mL IVPB        1 g 200 mL/hr over 30 Minutes Intravenous Every 24 hours 07/09/24 1516     07/09/24 1515  levofloxacin  (LEVAQUIN ) IVPB 750 mg  Status:  Discontinued        750 mg 100 mL/hr over 90 Minutes Intravenous Every 24 hours 07/09/24 1501 07/09/24 1516   07/09/24 1515  remdesivir  200 mg in sodium chloride  0.9% 250 mL IVPB       Placed in Followed by Linked Group   200 mg 580 mL/hr over 30 Minutes Intravenous Once 07/09/24 1518 07/09/24 2058   07/09/24 1500  oseltamivir  (TAMIFLU ) capsule 75 mg        75 mg Oral 2 times daily 07/09/24 1447     07/09/24 1445  oseltamivir  (TAMIFLU ) capsule 75 mg        75 mg Oral  Once 07/09/24 1431 07/09/24 1438   07/09/24 1015  levofloxacin  (LEVAQUIN ) IVPB 750 mg        750 mg 100 mL/hr over 90 Minutes Intravenous  Once 07/09/24 1013 07/09/24 1312      Subjective: Patient was seen and examined at bedside.  Overnight events noted. Patient reports doing better,  remains on 4 L of supplemental oxygen.   She appears anxious and depressed about her infection.  Objective: Vitals:   07/10/24 1011 07/10/24 1020 07/10/24 1030 07/10/24 1033  BP:  (!) 158/85 (!) 145/87 (!) 163/89  Pulse: 86 82 81 80  Resp: (!) 22 (!) 22 (!) 21 (!) 24  Temp: 97.9 F (36.6 C)  98.4 F (36.9 C) 98.4 F (36.9 C)  TempSrc: Oral  Oral Oral  SpO2: 96% 90% 94% 95%    Intake/Output Summary (Last 24 hours) at 07/10/2024 1145 Last data filed at 07/10/2024 9350 Gross per 24 hour  Intake 1090.43 ml  Output --  Net 1090.43 ml   There were no vitals filed for this visit.  Examination:  General exam: Appears calm and comfortable, not in any acute distress.  Appears  anxious. Respiratory system: CTA Bilaterally. Respiratory effort normal. RR 16 Cardiovascular system: S1 & S2 heard, RRR. No JVD, murmurs, rubs, gallops or clicks.  Gastrointestinal system: Abdomen is non distended, soft and non tender.  Normal bowel sounds heard. Central nervous system: Alert and oriented x 3. No focal neurological deficits. Extremities: No edema, no cyanosis, no clubbing. Skin: No rashes, lesions or ulcers Psychiatry: Judgement and insight appear normal. Mood & affect appropriate.    Data Reviewed: I have personally reviewed following labs and imaging studies  CBC: Recent Labs  Lab 07/09/24 1122 07/09/24 1135 07/09/24 1136 07/10/24 0515  WBC 8.1  --   --  5.6  NEUTROABS 6.5  --   --   --   HGB 7.9* 9.9* 9.9* 6.9*  HCT 28.2* 29.0* 29.0* 24.9*  MCV 70.9*  --   --  72.6*  PLT 330  --   --  285   Basic Metabolic Panel: Recent Labs  Lab 07/09/24 1122 07/09/24 1135 07/09/24 1136 07/09/24 2128 07/10/24 0515  NA 134* 134* 134*  --  139  K 3.4* 3.4* 3.4*  --  4.0  CL 95*  --  93*  --  101  CO2 30  --   --   --  29  GLUCOSE 90  --  91  --  164*  BUN 8  --  9  --  10  CREATININE 0.69  --  0.80  --  0.59  CALCIUM  8.6*  --   --   --  8.6*  MG  --   --   --  1.8  --   PHOS  --   --   --  4.0  --    GFR: Estimated Creatinine Clearance: 77.7 mL/min (by C-G formula based on SCr of 0.59 mg/dL). Liver Function Tests: Recent Labs  Lab 07/09/24 1122  AST 25  ALT 16  ALKPHOS 81  BILITOT 0.3  PROT 6.5  ALBUMIN  3.6   No results for input(s): LIPASE, AMYLASE in the last 168 hours. No results for input(s): AMMONIA in the last 168 hours. Coagulation Profile: Recent Labs  Lab 07/09/24 1122  INR 1.2   Cardiac Enzymes: No results for input(s): CKTOTAL, CKMB, CKMBINDEX, TROPONINI in the last 168 hours. BNP (last 3 results) No results for input(s): PROBNP in the last 8760 hours. HbA1C: Recent Labs    07/09/24 2128  HGBA1C 5.3    CBG: Recent Labs  Lab 07/09/24 1704 07/09/24 1728 07/10/24 0829  GLUCAP 167* 162* 175*   Lipid Profile: No results for input(s): CHOL, HDL, LDLCALC, TRIG, CHOLHDL, LDLDIRECT in the last 72 hours. Thyroid  Function Tests: No results for input(s): TSH, T4TOTAL, FREET4, T3FREE, THYROIDAB in the last 72 hours. Anemia Panel: No results for input(s): VITAMINB12, FOLATE, FERRITIN, TIBC, IRON , RETICCTPCT in the last 72 hours. Sepsis Labs: Recent Labs  Lab 07/09/24 1136 07/09/24 2128  PROCALCITON  --  <0.10  LATICACIDVEN 0.6  --     Recent Results (from the past 240 hours)  Resp panel by RT-PCR (RSV, Flu A&B, Covid) Peripheral     Status: Abnormal   Collection Time: 07/09/24 10:11 AM   Specimen: Peripheral; Nasal Swab  Result Value Ref Range Status   SARS Coronavirus 2 by RT PCR POSITIVE (A) NEGATIVE Final   Influenza A by PCR POSITIVE (A) NEGATIVE Final   Influenza B by PCR NEGATIVE NEGATIVE Final    Comment: (NOTE) The Xpert Xpress SARS-CoV-2/FLU/RSV plus assay is intended as an aid in the diagnosis of influenza from Nasopharyngeal swab specimens and should not be used as a sole basis for treatment. Nasal washings and aspirates are unacceptable for Xpert Xpress SARS-CoV-2/FLU/RSV testing.  Fact Sheet for Patients: bloggercourse.com  Fact Sheet for Healthcare Providers: seriousbroker.it  This test is not yet approved or cleared by the [place]  FDA and has been authorized for detection and/or diagnosis of SARS-CoV-2 by FDA under an Emergency Use Authorization (EUA). This EUA will remain in effect (meaning this test can be used) for the duration of the COVID-19 declaration under Section 564(b)(1) of the Act, 21 U.S.C. section 360bbb-3(b)(1), unless the authorization is terminated or revoked.     Resp Syncytial Virus by PCR NEGATIVE NEGATIVE Final    Comment: (NOTE) Fact Sheet for  Patients: bloggercourse.com  Fact Sheet for Healthcare Providers: seriousbroker.it  This test is not yet approved or cleared by the [place]  FDA  and has been authorized for detection and/or diagnosis of SARS-CoV-2 by FDA under an Emergency Use Authorization (EUA). This EUA will remain in effect (meaning this test can be used) for the duration of the COVID-19 declaration under Section 564(b)(1) of the Act, 21 U.S.C. section 360bbb-3(b)(1), unless the authorization is terminated or revoked.  Performed at [facility], [address] [place], [place] [address]     Radiology Studies: CT Angio Chest PE W and/or Wo Contrast Result Date: 07/09/2024 EXAM: CTA of the Chest with contrast for PE 07/09/2024 01:59:46 PM TECHNIQUE: CTA of the chest was performed without and with the administration of 75 mL of iohexol  (OMNIPAQUE ) 350 MG/ML injection. Multiplanar reformatted images are provided for review. MIP images are provided for review. Automated exposure control, iterative reconstruction, and/or weight based adjustment of the mA/kV was utilized to reduce the radiation dose to as low as reasonably achievable. Mild motion artifact is present. COMPARISON: Chest radiograph 07/09/2024. CLINICAL HISTORY: Pulmonary embolism (PE) suspected, high prob. FINDINGS: PULMONARY ARTERIES: Good opacification of the central and segmental pulmonary arteries. No focal filling defects. No evidence of significant pulmonary embolus. Main pulmonary artery is normal in caliber. MEDIASTINUM: Normal heart size. No pericardial effusions. Normal caliber thoracic aorta. No dissection. Calcification of the aorta and coronary arteries. Moderate-sized esophageal hiatal hernia. The esophagus is decompressed. LYMPH NODES: No significant lymphadenopathy. LUNGS AND PLEURA: Patchy infiltrates demonstrated in the right upper lung, right middle lung, and left lingula. These may  represent multifocal pneumonia or aspiration. There is evidence of opacification in lower lobe bronchi with bronchial wall thickening, possibly mucous plugging or aspiration. No pleural effusion or pneumothorax. UPPER ABDOMEN: 1.6 cm diameter left adrenal gland nodule with attenuation measurements of 2.7 cm. SOFT TISSUES AND BONES: Spinal cord stimulator leads in the thoracic spine. Degenerative changes in the spine. No acute soft tissue abnormality. IMPRESSION: 1. No evidence of pulmonary embolism. 2. Patchy infiltrates in the right upper lung, right middle lung, and left lingula, possibly representing multifocal pneumonia or aspiration. 3. Findings suggestive of lower lobe airway mucus plugging or aspiration. 4. Moderate-sized esophageal hiatal hernia with decompressed esophagus. 5. Aortic and coronary artery calcifications. Electronically signed by: Elsie Gravely MD 07/09/2024 02:17 PM EST RP Workstation: [id number]   DG Chest 1 View Result Date: 07/09/2024 CLINICAL DATA:  Sepsis. EXAM: DG CHEST 1V COMPARISON:  Chest radiograph dated 07/08/2024 FINDINGS: No focal consolidation, pleural effusion or pneumothorax. There is mild cardiomegaly with mild vascular congestion. Moderate size hiatal hernia. Spinal stimulator. No acute osseous pathology. IMPRESSION: 1. Mild cardiomegaly with mild vascular congestion. No focal consolidation. 2. Moderate size hiatal hernia. Electronically Signed   By: Vanetta Chou M.D.   On: 07/09/2024 10:55   DG Chest 2 View Result Date: 07/08/2024 EXAM: 2 VIEW(S) XRAY OF THE CHEST 07/08/2024 04:05:00 PM COMPARISON: None available. CLINICAL HISTORY: cough FINDINGS: LINES, TUBES AND DEVICES: Spinal stimulator in place. LUNGS AND PLEURA: Hazy interstitial opacity at right lung base. No pleural effusion. No pneumothorax. HEART AND MEDIASTINUM: No acute abnormality of the cardiac and mediastinal silhouettes. BONES AND SOFT TISSUES: No acute osseous abnormality. IMPRESSION: 1. Hazy  interstitial opacity at the right lung base, which may reflect atelectasis versus early pneumonitis. Electronically signed by: Greig Pique MD 07/08/2024 04:32 PM EST RP Workstation: [id number]    Scheduled Meds:  acetaminophen   1,000 mg Oral Q8H   Or   acetaminophen   650 mg Rectal Q8H   ascorbic acid   500 mg Oral Daily   buPROPion   150 mg Oral Daily   clonazePAM   1 mg Oral QHS   FLUoxetine   20 mg Oral Daily   furosemide   40 mg Oral Daily   guaiFENesin   10 mL Oral Q8H   heparin   5,000 Units Subcutaneous Q8H   insulin  aspart  0-6 Units Subcutaneous TID WC   ipratropium-albuterol   3 mL Nebulization Q6H   levothyroxine   88 mcg Oral QAC breakfast   methylPREDNISolone  (SOLU-MEDROL ) injection  40 mg Intravenous Q12H   oseltamivir   75 mg Oral BID   pantoprazole   40 mg Oral Daily   pramipexole   2 mg Oral TID   sodium chloride  flush  3 mL Intravenous Q12H   sodium chloride  flush  3 mL Intravenous Q12H   zinc  sulfate (50mg  elemental zinc )  220 mg Oral Daily   Continuous Infusions:  sodium chloride      azithromycin  (ZITHROMAX ) 500 mg in dextrose  5 % 250 mL IVPB     cefTRIAXone  (ROCEPHIN )  IV Stopped (07/09/24 1959)   remdesivir  100 mg in sodium chloride  0.9 % 100 mL IVPB Stopped (07/10/24 1034)     LOS: 0 days    Time spent: 50 Mins    Darcel Dawley, MD [facility]   If 7PM-7AM, please contact night-coverage

## 2024-07-10 NOTE — Evaluation (Signed)
 Occupational Therapy Evaluation Patient Details Name: Kendra [name] MRN: [id number] DOB: 10/23/1945 Today's Date: 07/10/2024   History of Present Illness   Pt is a 78 yo female who presents 07/08/24 with shortness of breath and cough. Found to have acute respiratory failure due to influenza A, SARS-CoV-2, and multilobular pneumonia.   PHMx: anemia of chronic disease, GERD, depression, anxiety, hypothyroidism, HLD, RLS, HTN, GERD, obesity, HTN,chronic pain, spinal cord stimulator, DDD, and L THR.     Clinical Impressions This 78 yo female admitted with above presents to acute OT with PLOF of being totally independent with basic ADLs, IADLs, and driving. She currently is setup-min A for basic ADLs on 4 liters of O2 and has DOE with ambulation. She will continue to benefit from acute OT without need for follow up OT.     If plan is discharge home, recommend the following:   A little help with walking and/or transfers;A little help with bathing/dressing/bathroom;Assistance with cooking/housework;Assist for transportation;Help with stairs or ramp for entrance     Functional Status Assessment   Patient has had a recent decline in their functional status and demonstrates the ability to make significant improvements in function in a reasonable and predictable amount of time.     Equipment Recommendations   None recommended by OT      Precautions/Restrictions   Precautions Precaution/Restrictions Comments: watch HR, RR, and O2 Restrictions Weight Bearing Restrictions Per Provider Order: No     Mobility Bed Mobility Overal bed mobility: Independent                  Transfers Overall transfer level: Needs assistance   Transfers: Sit to/from Stand Sit to Stand: Contact guard assist                  Balance Overall balance assessment: Needs assistance Sitting-balance support: No upper extremity supported, Feet supported Sitting balance-Leahy Scale: Good      Standing balance support: No upper extremity supported Standing balance-Leahy Scale: Fair                             ADL either performed or assessed with clinical judgement   ADL Overall ADL's : Needs assistance/impaired Eating/Feeding: Independent;Sitting   Grooming: Set up;Sitting   Upper Body Bathing: Set up;Sitting   Lower Body Bathing: Contact guard assist;Sit to/from stand   Upper Body Dressing : Set up;Sitting   Lower Body Dressing: Set up;Sit to/from stand   Toilet Transfer: Contact guard assist;Ambulation;Comfort height toilet Toilet Transfer Details (indicate cue type and reason): standing to urinate Toileting- Clothing Manipulation and Hygiene: Contact guard assist;Sit to/from stand               Vision Patient Visual Report: No change from baseline              Pertinent Vitals/Pain Pain Assessment Pain Assessment: No/denies pain     Extremity/Trunk Assessment Upper Extremity Assessment Upper Extremity Assessment: Overall WFL for tasks assessed           Communication Communication Communication: No apparent difficulties   Cognition Arousal: Alert Behavior During Therapy: WFL for tasks assessed/performed Cognition: No apparent impairments                               Following commands: Intact       Cueing  General Comments   Cueing Techniques: Verbal cues  Pts sats on RA as low as 87 so pt put on 4 liters of O2 for ambulation with sats 87-94; HR as high as 126 (parameter 120) and RR as high as 44 (parameter 38)           Home Living Family/patient expects to be discharged to:: Private residence Living Arrangements: Alone Available Help at Discharge: Family;Friend(s);Neighbor;Available PRN/intermittently Type of Home: House Home Access: Level entry     Home Layout: One level     Bathroom Shower/Tub: Producer, Television/film/video: Standard     Home Equipment: Agricultural Consultant (2  wheels);BSC/3in1;Wheelchair - [employer] Social Research Officer - single point          Prior Functioning/Environment Prior Level of Function : Independent/Modified Independent;Driving             Mobility Comments: Has been using a SPC as of recently due to weakness ADLs Comments: Independent    OT Problem List: Decreased activity tolerance;Impaired balance (sitting and/or standing);Obesity;Cardiopulmonary status limiting activity   OT Treatment/Interventions: Self-care/ADL training;Energy conservation;Patient/family education      OT Goals(Current goals can be found in the care plan section)   Acute Rehab OT Goals Patient Stated Goal: to feel and breath better OT Goal Formulation: With patient Time For Goal Achievement: 07/24/24 Potential to Achieve Goals: Good   OT Frequency:  Min 2X/week    Co-evaluation PT/OT/SLP Co-Evaluation/Treatment: Yes Reason for Co-Treatment: For patient/therapist safety;To address functional/ADL transfers PT goals addressed during session: Mobility/safety with mobility;Balance;Proper use of DME;Strengthening/ROM OT goals addressed during session: Strengthening/ROM;ADL's and self-care      AM-PAC OT 6 Clicks Daily Activity     Outcome Measure Help from another person eating meals?: None Help from another person taking care of personal grooming?: A Little Help from another person toileting, which includes using toliet, bedpan, or urinal?: A Little Help from another person bathing (including washing, rinsing, drying)?: A Little Help from another person to put on and taking off regular upper body clothing?: A Little Help from another person to put on and taking off regular lower body clothing?: A Little 6 Click Score: 19   End of Session Equipment Utilized During Treatment: Gait belt (pushing transport chair with O2 tank on it) Nurse Communication: Mobility status  Activity Tolerance:  (limited by DOE) Patient left: in bed;with call bell/phone  within reach  OT Visit Diagnosis: Unsteadiness on feet (R26.81);Muscle weakness (generalized) (M62.81)                Time: 9069-9048 OT Time Calculation (min): 21 min Charges:  OT General Charges $OT Visit: 1 Visit OT Evaluation $OT Eval Moderate Complexity: 1 Mod  Cathy L. OT Acute Rehabilitation Services Office [phone]    [name] Kendra Distel 07/10/2024, 11:36 AM

## 2024-07-11 DIAGNOSIS — J9601 Acute respiratory failure with hypoxia: Secondary | ICD-10-CM | POA: Diagnosis not present

## 2024-07-11 LAB — GLUCOSE, CAPILLARY
Glucose-Capillary: 199 mg/dL — ABNORMAL HIGH (ref 70–99)
Glucose-Capillary: 97 mg/dL (ref 70–99)
Glucose-Capillary: 96 mg/dL (ref 70–99)

## 2024-07-11 LAB — C-REACTIVE PROTEIN: CRP: 5.8 mg/dL — ABNORMAL HIGH (ref <1.0)

## 2024-07-11 LAB — SEDIMENTATION RATE: Sed Rate: 13 mm/h (ref 0–22)

## 2024-07-11 LAB — BASIC METABOLIC PANEL WITH GFR
Anion gap: 7 (ref 5–15)
BUN: 14 mg/dL (ref 8–23)
CO2: 33 mmol/L — ABNORMAL HIGH (ref 22–32)
Calcium: 8.9 mg/dL (ref 8.9–10.3)
Chloride: 100 mmol/L (ref 98–111)
Creatinine, Ser: 0.52 mg/dL (ref 0.44–1.00)
GFR, Estimated: >60 mL/min (ref >60)
Glucose, Bld: 153 mg/dL — ABNORMAL HIGH (ref 70–99)
Potassium: 4.2 mmol/L (ref 3.5–5.1)
Sodium: 139 mmol/L (ref 135–145)

## 2024-07-11 LAB — CBC
HCT: 28.1 % — ABNORMAL LOW (ref 36.0–46.0)
Hemoglobin: 8 g/dL — ABNORMAL LOW (ref 12.0–15.0)
MCH: 21.1 pg — ABNORMAL LOW (ref 26.0–34.0)
MCHC: 28.5 g/dL — ABNORMAL LOW (ref 30.0–36.0)
MCV: 74.1 fL — ABNORMAL LOW (ref 80.0–100.0)
Platelets: 270 K/uL (ref 150–400)
RBC: 3.79 MIL/uL — ABNORMAL LOW (ref 3.87–5.11)
RDW: 20 % — ABNORMAL HIGH (ref 11.5–15.5)
WBC: 8 K/uL (ref 4.0–10.5)
nRBC: 0 % (ref 0.0–0.2)

## 2024-07-11 LAB — TYPE AND SCREEN
ABO/RH(D): O POS
Antibody Screen: NEGATIVE
Unit division: 0

## 2024-07-11 LAB — BPAM RBC
Blood Product Expiration Date: 202601122359
ISSUE DATE / TIME: 202512160942
Unit Type and Rh: 5100

## 2024-07-11 MED ORDER — AZITHROMYCIN 500 MG PO TABS
500.0000 mg | ORAL_TABLET | Freq: Every day | ORAL | Status: AC
  Administered 2024-07-11 – 2024-07-14 (×4): 500 mg via ORAL
  Filled 2024-07-11 (×4): qty 1

## 2024-07-11 MED ORDER — PREDNISONE 20 MG PO TABS
40.0000 mg | ORAL_TABLET | Freq: Once | ORAL | Status: AC
  Administered 2024-07-11: 15:00:00 40 mg via ORAL
  Filled 2024-07-11: qty 2

## 2024-07-11 MED ORDER — PREDNISONE 20 MG PO TABS
80.0000 mg | ORAL_TABLET | Freq: Every day | ORAL | Status: DC
  Administered 2024-07-12 – 2024-07-14 (×3): 80 mg via ORAL
  Filled 2024-07-11 (×3): qty 4

## 2024-07-11 MED ORDER — IPRATROPIUM-ALBUTEROL 0.5-2.5 (3) MG/3ML IN SOLN
3.0000 mL | Freq: Three times a day (TID) | RESPIRATORY_TRACT | Status: DC
  Administered 2024-07-12 – 2024-07-14 (×7): 3 mL via RESPIRATORY_TRACT
  Filled 2024-07-11 (×6): qty 3

## 2024-07-11 NOTE — Progress Notes (Signed)
 PROGRESS NOTE    Kendra [name]  FMW:[id number] DOB: 23-Nov-1945 DOA: 07/09/2024 PCP: Benson [name], NP    Brief Narrative:  This 78 yrs old female with PMH significant for anemia of chronic disease, GERD, depression, anxiety, hypothyroidism, HLD, RLS, HTN, GERD, obesity presented in the ED with complaints of cough and shortness of breath.  Patient reports having URI symptoms  (cough, headache, shortness of breath, nasal congestion) started 2 days ago.  She was initially seen in the urgent care and was given a shot of steroids and doxycycline  but her symptoms persistently got worse.  Patient had a fever and became severely short of breath, went back to the urgent care and was sent in the ED.  She does not use oxygen at baseline.  She was hypoxic on arrival in the ED with SpO2 of 83% on room air requiring 4 L of supplemental oxygen to improve saturation to 95%.  She is influenza A+, COVID+, influenza B, RSV negative.  CTA chest ruled out acute pulmonary embolism but shows finding consistent with multilobar pneumonia.  Patient was admitted for further evaluation.  Assessment & Plan:   Principal Problem:   Acute hypoxic respiratory failure (HCC) Active Problems:   Depression   Benign essential hypertension   Iron  deficiency anemia   Influenza A with pneumonia   COVID-19 virus infection   GERD without esophagitis   Mixed hyperlipidemia   Restless leg syndrome   Pre-diabetes   Morbid obesity with BMI of 40.0-44.9, adult (HCC)  Acute hypoxic respiratory failure: Multifocal pneumonia, viral: Likely secondary to influenza A+, COVID +. She does not use oxygen at home at baseline.  Now requiring 4 L to maintain saturation above 94%.  Wean as tolerated. CTA ruled out acute pulmonary embolism but shows finding consistent with multifocal pneumonia. Given her risk factors, initiated on empiric antibiotics. Continue ceftriaxone  and azithromycin . Lactic acid 0.6, WBC 8.1, procalcitonin  0.10 Blood cultures > NGTD Initiated on IV Solu-Medrol ,  now switched to prednisone . Encourage incentive spirometry, flutter valve. Continue DuoNeb nebulization scheduled and as needed. Continue airborne isolation. Continue to monitor inflammatory markers.   COVID 19 virus infection: Continue with supportive therapy. Continue steroids, mucolytics D/w ID. Continue remdesivir  for 3 days.   Influenza A with pneumonia: Continue Tamiflu , treating pneumonia with empiric antibiotics.  Iron  deficiency anemia: Anemia of chronic disease: Hb dropped to 6.9.  No obvious visible bleeding. S/p 1PRBC, Hb improved to 8.0.    Essential hypertension: Continue Lasix .  Continue hydralazine  as needed.   Depression: Mood stable, Continue Wellbutrin , Klonopin , and Prozac .   Mixed hyperlipidemia: Resume statin.   GERD without esophagitis: Continue PPI.   Restless leg syndrome: Continue Mirapex .   Morbid obesity with BMI of 40.0-44.9, adult (HCC) Discussed and counseled regarding diet exercise,  follow-up with PCP regarding weight loss programs   Pre-diabetes: Anticipating hyperglycemia with IV steroids -Check her CBG q. ACHS, SSI coverage -Last A1c 2 months ago 5.4    DVT prophylaxis: Heparin  sq Code Status: Full code Family Communication: No family at bed side. Disposition Plan:   Status is: Inpatient Remains inpatient appropriate because:      Admitted for acute hypoxic respiratory failure secondary to COVID and influenza infection.  She is not medically ready for discharge. Check SpO2 while ambulation to see if she qualifies for home oxygen.  Consultants:  None  Procedures: CTA Chest  Antimicrobials:  Anti-infectives (From admission, onward)    Start     Dose/Rate Route Frequency Ordered Stop  07/11/24 1245  azithromycin  (ZITHROMAX ) tablet 500 mg        500 mg Oral Daily 07/11/24 1158 07/15/24 0959   07/10/24 1400  azithromycin  (ZITHROMAX ) 500 mg in dextrose  5 % 250  mL IVPB  Status:  Discontinued        500 mg 255 mL/hr over 60 Minutes Intravenous Every 24 hours 07/09/24 1516 07/11/24 1158   07/10/24 1000  remdesivir  100 mg in sodium chloride  0.9 % 100 mL IVPB       Placed in Followed by Linked Group   100 mg 200 mL/hr over 30 Minutes Intravenous Daily 07/09/24 1518 07/11/24 1054   07/09/24 1530  cefTRIAXone  (ROCEPHIN ) 1 g in sodium chloride  0.9 % 100 mL IVPB        1 g 200 mL/hr over 30 Minutes Intravenous Every 24 hours 07/09/24 1516     07/09/24 1515  levofloxacin  (LEVAQUIN ) IVPB 750 mg  Status:  Discontinued        750 mg 100 mL/hr over 90 Minutes Intravenous Every 24 hours 07/09/24 1501 07/09/24 1516   07/09/24 1515  remdesivir  200 mg in sodium chloride  0.9% 250 mL IVPB       Placed in Followed by Linked Group   200 mg 580 mL/hr over 30 Minutes Intravenous Once 07/09/24 1518 07/09/24 2058   07/09/24 1500  oseltamivir  (TAMIFLU ) capsule 75 mg        75 mg Oral 2 times daily 07/09/24 1447     07/09/24 1445  oseltamivir  (TAMIFLU ) capsule 75 mg        75 mg Oral  Once 07/09/24 1431 07/09/24 1438   07/09/24 1015  levofloxacin  (LEVAQUIN ) IVPB 750 mg        750 mg 100 mL/hr over 90 Minutes Intravenous  Once 07/09/24 1013 07/09/24 1312      Subjective: Patient was seen and examined at bedside.  Overnight events noted. Patient reports doing better,  remains on 4 L of supplemental oxygen.    Objective: Vitals:   07/10/24 2041 07/11/24 0200 07/11/24 0443 07/11/24 0822  BP: 128/73 132/61 137/82 (!) 146/79  Pulse: 90 69 68 90  Resp: (!) 22 20 (!) 22 (!) 21  Temp: 98.3 F (36.8 C) 97.9 F (36.6 C) 98.1 F (36.7 C)   TempSrc: Oral Oral Oral   SpO2: 94% 98% 94% 93%  Height:        Intake/Output Summary (Last 24 hours) at 07/11/2024 1311 Last data filed at 07/11/2024 0600 Gross per 24 hour  Intake 1083.41 ml  Output --  Net 1083.41 ml   There were no vitals filed for this visit.  Examination:  General exam: Appears calm and  comfortable, not in any acute distress.  Appears anxious. Respiratory system: CTA Bilaterally. Respiratory effort normal. RR 15 Cardiovascular system: S1 & S2 heard, RRR. No JVD, murmurs, rubs, gallops or clicks.  Gastrointestinal system: Abdomen is non distended, soft and non tender.  Normal bowel sounds heard. Central nervous system: Alert and oriented x 3. No focal neurological deficits. Extremities: No edema, no cyanosis, no clubbing. Skin: No rashes, lesions or ulcers Psychiatry: Judgement and insight appear normal. Mood & affect appropriate.    Data Reviewed: I have personally reviewed following labs and imaging studies  CBC: Recent Labs  Lab 07/09/24 1122 07/09/24 1135 07/09/24 1136 07/10/24 0515 07/11/24 0433  WBC 8.1  --   --  5.6 8.0  NEUTROABS 6.5  --   --   --   --  HGB 7.9* 9.9* 9.9* 6.9* 8.0*  HCT 28.2* 29.0* 29.0* 24.9* 28.1*  MCV 70.9*  --   --  72.6* 74.1*  PLT 330  --   --  285 270   Basic Metabolic Panel: Recent Labs  Lab 07/09/24 1122 07/09/24 1135 07/09/24 1136 07/09/24 2128 07/10/24 0515 07/11/24 0433  NA 134* 134* 134*  --  139 139  K 3.4* 3.4* 3.4*  --  4.0 4.2  CL 95*  --  93*  --  101 100  CO2 30  --   --   --  29 33*  GLUCOSE 90  --  91  --  164* 153*  BUN 8  --  9  --  10 14  CREATININE 0.69  --  0.80  --  0.59 0.52  CALCIUM  8.6*  --   --   --  8.6* 8.9  MG  --   --   --  1.8  --   --   PHOS  --   --   --  4.0  --   --    GFR: Estimated Creatinine Clearance: 77.7 mL/min (by C-G formula based on SCr of 0.52 mg/dL). Liver Function Tests: Recent Labs  Lab 07/09/24 1122  AST 25  ALT 16  ALKPHOS 81  BILITOT 0.3  PROT 6.5  ALBUMIN  3.6   No results for input(s): LIPASE, AMYLASE in the last 168 hours. No results for input(s): AMMONIA in the last 168 hours. Coagulation Profile: Recent Labs  Lab 07/09/24 1122  INR 1.2   Cardiac Enzymes: No results for input(s): CKTOTAL, CKMB, CKMBINDEX, TROPONINI in the last 168  hours. BNP (last 3 results) No results for input(s): PROBNP in the last 8760 hours. HbA1C: Recent Labs    07/09/24 2128  HGBA1C 5.3   CBG: Recent Labs  Lab 07/10/24 1217 07/10/24 1642 07/10/24 2240 07/11/24 0926 07/11/24 1227  GLUCAP 169* 133* 204* 199* 97   Lipid Profile: No results for input(s): CHOL, HDL, LDLCALC, TRIG, CHOLHDL, LDLDIRECT in the last 72 hours. Thyroid  Function Tests: No results for input(s): TSH, T4TOTAL, FREET4, T3FREE, THYROIDAB in the last 72 hours. Anemia Panel: No results for input(s): VITAMINB12, FOLATE, FERRITIN, TIBC, IRON , RETICCTPCT in the last 72 hours. Sepsis Labs: Recent Labs  Lab 07/09/24 1136 07/09/24 2128  PROCALCITON  --  <0.10  LATICACIDVEN 0.6  --     Recent Results (from the past 240 hours)  Resp panel by RT-PCR (RSV, Flu A&B, Covid) Peripheral     Status: Abnormal   Collection Time: 07/09/24 10:11 AM   Specimen: Peripheral; Nasal Swab  Result Value Ref Range Status   SARS Coronavirus 2 by RT PCR POSITIVE (A) NEGATIVE Final   Influenza A by PCR POSITIVE (A) NEGATIVE Final   Influenza B by PCR NEGATIVE NEGATIVE Final    Comment: (NOTE) The Xpert Xpress SARS-CoV-2/FLU/RSV plus assay is intended as an aid in the diagnosis of influenza from Nasopharyngeal swab specimens and should not be used as a sole basis for treatment. Nasal washings and aspirates are unacceptable for Xpert Xpress SARS-CoV-2/FLU/RSV testing.  Fact Sheet for Patients: bloggercourse.com  Fact Sheet for Healthcare Providers: seriousbroker.it  This test is not yet approved or cleared by the [place]  FDA and has been authorized for detection and/or diagnosis of SARS-CoV-2 by FDA under an Emergency Use Authorization (EUA). This EUA will remain in effect (meaning this test can be used) for the duration of the COVID-19 declaration under Section 564(b)(1) of the  Act, 21  U.S.C. section 360bbb-3(b)(1), unless the authorization is terminated or revoked.     Resp Syncytial Virus by PCR NEGATIVE NEGATIVE Final    Comment: (NOTE) Fact Sheet for Patients: bloggercourse.com  Fact Sheet for Healthcare Providers: seriousbroker.it  This test is not yet approved or cleared by the [place]  FDA and has been authorized for detection and/or diagnosis of SARS-CoV-2 by FDA under an Emergency Use Authorization (EUA). This EUA will remain in effect (meaning this test can be used) for the duration of the COVID-19 declaration under Section 564(b)(1) of the Act, 21 U.S.C. section 360bbb-3(b)(1), unless the authorization is terminated or revoked.  Performed at [facility], [address] [place], [place] [address]   Blood Culture (routine x 2)     Status: None (Preliminary result)   Collection Time: 07/09/24 10:11 AM   Specimen: BLOOD  Result Value Ref Range Status   Specimen Description BLOOD SITE NOT SPECIFIED  Final   Special Requests   Final    BOTTLES DRAWN AEROBIC AND ANAEROBIC Blood Culture results may not be optimal due to an inadequate volume of blood received in culture bottles   Culture   Final    NO GROWTH 2 DAYS Performed at [facility], [address], [place], [place] [address]    Report Status PENDING  Incomplete  Blood Culture (routine x 2)     Status: None (Preliminary result)   Collection Time: 07/09/24 11:15 AM   Specimen: BLOOD  Result Value Ref Range Status   Specimen Description BLOOD SITE NOT SPECIFIED  Final   Special Requests   Final    BOTTLES DRAWN AEROBIC AND ANAEROBIC Blood Culture results may not be optimal due to an inadequate volume of blood received in culture bottles   Culture   Final    NO GROWTH 2 DAYS Performed at [facility], [address], [place], [place] [address]    Report Status PENDING  Incomplete    Radiology Studies: CT Angio Chest PE  W and/or Wo Contrast Result Date: 07/09/2024 EXAM: CTA of the Chest with contrast for PE 07/09/2024 01:59:46 PM TECHNIQUE: CTA of the chest was performed without and with the administration of 75 mL of iohexol  (OMNIPAQUE ) 350 MG/ML injection. Multiplanar reformatted images are provided for review. MIP images are provided for review. Automated exposure control, iterative reconstruction, and/or weight based adjustment of the mA/kV was utilized to reduce the radiation dose to as low as reasonably achievable. Mild motion artifact is present. COMPARISON: Chest radiograph 07/09/2024. CLINICAL HISTORY: Pulmonary embolism (PE) suspected, high prob. FINDINGS: PULMONARY ARTERIES: Good opacification of the central and segmental pulmonary arteries. No focal filling defects. No evidence of significant pulmonary embolus. Main pulmonary artery is normal in caliber. MEDIASTINUM: Normal heart size. No pericardial effusions. Normal caliber thoracic aorta. No dissection. Calcification of the aorta and coronary arteries. Moderate-sized esophageal hiatal hernia. The esophagus is decompressed. LYMPH NODES: No significant lymphadenopathy. LUNGS AND PLEURA: Patchy infiltrates demonstrated in the right upper lung, right middle lung, and left lingula. These may represent multifocal pneumonia or aspiration. There is evidence of opacification in lower lobe bronchi with bronchial wall thickening, possibly mucous plugging or aspiration. No pleural effusion or pneumothorax. UPPER ABDOMEN: 1.6 cm diameter left adrenal gland nodule with attenuation measurements of 2.7 cm. SOFT TISSUES AND BONES: Spinal cord stimulator leads in the thoracic spine. Degenerative changes in the spine. No acute soft tissue abnormality. IMPRESSION: 1. No evidence of pulmonary embolism. 2.  Patchy infiltrates in the right upper lung, right middle lung, and left lingula, possibly representing multifocal pneumonia or aspiration. 3. Findings suggestive of lower lobe airway  mucus plugging or aspiration. 4. Moderate-sized esophageal hiatal hernia with decompressed esophagus. 5. Aortic and coronary artery calcifications. Electronically signed by: Elsie Gravely MD 07/09/2024 02:17 PM EST RP Workstation: [id number]    Scheduled Meds:  acetaminophen   1,000 mg Oral Q8H   Or   acetaminophen   650 mg Rectal Q8H   ascorbic acid   500 mg Oral Daily   azithromycin   500 mg Oral Daily   buPROPion   150 mg Oral Daily   clonazePAM   1 mg Oral QHS   FLUoxetine   20 mg Oral Daily   furosemide   40 mg Oral Daily   guaiFENesin   10 mL Oral Q8H   heparin   5,000 Units Subcutaneous Q8H   insulin  aspart  0-6 Units Subcutaneous TID WC   ipratropium-albuterol   3 mL Nebulization Q6H   levothyroxine   88 mcg Oral QAC breakfast   oseltamivir   75 mg Oral BID   pantoprazole   40 mg Oral Daily   pramipexole   2 mg Oral TID   predniSONE   40 mg Oral Once   [START ON 07/12/2024] predniSONE   80 mg Oral Q breakfast   sodium chloride  flush  3 mL Intravenous Q12H   sodium chloride  flush  3 mL Intravenous Q12H   zinc  sulfate (50mg  elemental zinc )  220 mg Oral Daily   Continuous Infusions:  cefTRIAXone  (ROCEPHIN )  IV Stopped (07/10/24 2037)     LOS: 1 day    Time spent: 35 Mins    Darcel Dawley, MD [facility]   If 7PM-7AM, please contact night-coverage

## 2024-07-11 NOTE — Plan of Care (Signed)
°  Problem: Education: Goal: Knowledge of risk factors and measures for prevention of condition will improve Outcome: Progressing   Problem: Coping: Goal: Psychosocial and spiritual needs will be supported Outcome: Progressing   Problem: Respiratory: Goal: Will maintain a patent airway Outcome: Progressing Goal: Complications related to the disease process, condition or treatment will be avoided or minimized Outcome: Progressing   Problem: Education: Goal: Ability to describe self-care measures that may prevent or decrease complications (Diabetes Survival Skills Education) will improve Outcome: Progressing Goal: Individualized Educational Video(s) Outcome: Progressing   Problem: Coping: Goal: Ability to adjust to condition or change in health will improve Outcome: Progressing   Problem: Fluid Volume: Goal: Ability to maintain a balanced intake and output will improve Outcome: Progressing   Problem: Health Behavior/Discharge Planning: Goal: Ability to identify and utilize available resources and services will improve Outcome: Progressing Goal: Ability to manage health-related needs will improve Outcome: Progressing   Problem: Metabolic: Goal: Ability to maintain appropriate glucose levels will improve Outcome: Progressing   Problem: Nutritional: Goal: Maintenance of adequate nutrition will improve Outcome: Progressing Goal: Progress toward achieving an optimal weight will improve Outcome: Progressing   Problem: Skin Integrity: Goal: Risk for impaired skin integrity will decrease Outcome: Progressing   Problem: Tissue Perfusion: Goal: Adequacy of tissue perfusion will improve Outcome: Progressing   Problem: Activity: Goal: Ability to tolerate increased activity will improve Outcome: Progressing   Problem: Clinical Measurements: Goal: Ability to maintain a body temperature in the normal range will improve Outcome: Progressing   Problem: Respiratory: Goal:  Ability to maintain adequate ventilation will improve Outcome: Progressing Goal: Ability to maintain a clear airway will improve Outcome: Progressing   Problem: Education: Goal: Knowledge of General Education information will improve Description: Including pain rating scale, medication(s)/side effects and non-pharmacologic comfort measures Outcome: Progressing   Problem: Health Behavior/Discharge Planning: Goal: Ability to manage health-related needs will improve Outcome: Progressing   Problem: Clinical Measurements: Goal: Ability to maintain clinical measurements within normal limits will improve Outcome: Progressing Goal: Will remain free from infection Outcome: Progressing Goal: Diagnostic test results will improve Outcome: Progressing Goal: Respiratory complications will improve Outcome: Progressing Goal: Cardiovascular complication will be avoided Outcome: Progressing   Problem: Activity: Goal: Risk for activity intolerance will decrease Outcome: Progressing   Problem: Nutrition: Goal: Adequate nutrition will be maintained Outcome: Progressing   Problem: Coping: Goal: Level of anxiety will decrease Outcome: Progressing   Problem: Elimination: Goal: Will not experience complications related to bowel motility Outcome: Progressing Goal: Will not experience complications related to urinary retention Outcome: Progressing   Problem: Pain Managment: Goal: General experience of comfort will improve and/or be controlled Outcome: Progressing   Problem: Safety: Goal: Ability to remain free from injury will improve Outcome: Progressing   Problem: Skin Integrity: Goal: Risk for impaired skin integrity will decrease Outcome: Progressing

## 2024-07-11 NOTE — Progress Notes (Signed)
 Physical Therapy Treatment Patient Details Name: Kendra [name] MRN: [id number] DOB: March 31, 1946 Today's Date: 07/11/2024   History of Present Illness 78 y.o. female admitted 07/08/24 with SOB, cough; workup for acute hypoxic respiratory failure secondary to influenza A, SARS-CoV-2, multilobular PNA. PMH includes anemia, HTN, L THA, DDD, spinal cord stimulator, chronic pain, obesity, RLS.    PT Comments  Pt progressing with mobility. Today's session focused on ambulation for improving strength and activity tolerance; educ on pulmonary hygiene, activity recommendations, HEP, energy conservation strategies. Pt remains limited by decreased activity tolerance and impaired balance strategies/postural reactions. Will continue to follow acutely to address established goals.  SATURATION QUALIFICATIONS: Patient Saturations on Room Air at Rest = 89% Patient Saturations on [employer] while Ambulating = 84% Patient Saturations on 2 Liters of oxygen  at Rest = 94% Patient Saturations on 2 Liters of oxygen while Ambulating = 89%     If plan is discharge home, recommend the following: Assist for transportation;Assistance with cooking/housework   Can travel by private vehicle      Yes  Equipment Recommendations  None recommended by PT    Recommendations for Other Services  Mobility Specialist     Precautions / Restrictions Precautions Precautions: Other (comment) Recall of Precautions/Restrictions: Intact Precaution/Restrictions Comments: watch SpO2 (does not wear baseline) Restrictions Weight Bearing Restrictions Per Provider Order: No     Mobility  Bed Mobility Overal bed mobility: Modified Independent             General bed mobility comments: HOB elevated    Transfers Overall transfer level: Modified independent Equipment used: Rolling walker (2 wheels)               General transfer comment: multiple sit<>stands from EOB mod indep with RW     Ambulation/Gait Ambulation/Gait assistance: Supervision Gait Distance (Feet): 160 Feet Assistive device: Rolling walker (2 wheels) Gait Pattern/deviations: Step-through pattern, Decreased stride length, Trunk flexed, Wide base of support Gait velocity: Decreased     General Gait Details: pt reports increased fatigue/SOB having just walked to/from bathroom with nursing. slow gait with RW, supervision for safety/lines, labored breathing but does not seem significantly anxious regarding this. pt walking ~80' on RW with SpO2 84%; prolonged sitting rest break, walking additional ~80' on 2L O2 [name] with SpO2 89%   Stairs             Wheelchair Mobility     Tilt Bed    Modified Rankin (Stroke Patients Only)       Balance Overall balance assessment: Needs assistance Sitting-balance support: No upper extremity supported, Feet supported Sitting balance-Leahy Scale: Good     Standing balance support: No upper extremity supported, During functional activity Standing balance-Leahy Scale: Fair Standing balance comment: can stand and take steps without UE support; dynamic stability improved with RW support                            Communication Communication Communication: No apparent difficulties  Cognition Arousal: Alert Behavior During Therapy: WFL for tasks assessed/performed   PT - Cognitive impairments: No apparent impairments                         Following commands: Intact      Cueing    Exercises      General Comments General comments (skin integrity, edema, etc.): RT present at end of session to provide breathing tx. educ pt  re: role of acute PT, POC, activity recmomendations, pulmonary hygiene, importance of mobility, energy conservation strategies (including use of RW at home, which pt owns), therex/AROM (HEP provided), potential d/c needs (including initial assist with ADL/iADLs pending activity tolerance)      Pertinent  Vitals/Pain Pain Assessment Pain Assessment: Faces Faces Pain Scale: Hurts a little bit Pain Location: lower abdomen from coughing Pain Descriptors / Indicators: Sore Pain Intervention(s): Monitored during session    Home Living                          Prior Function            PT Goals (current goals can now be found in the care plan section) Acute Rehab PT Goals Patient Stated Goal: hoping to not need oxygen at home PT Goal Formulation: With patient Time For Goal Achievement: 07/24/24 Potential to Achieve Goals: Good Progress towards PT goals: Progressing toward goals    Frequency    Min 2X/week      PT Plan      Co-evaluation              AM-PAC PT 6 Clicks Mobility   Outcome Measure  Help needed turning from your back to your side while in a flat bed without using bedrails?: None Help needed moving from lying on your back to sitting on the side of a flat bed without using bedrails?: None Help needed moving to and from a bed to a chair (including a wheelchair)?: None Help needed standing up from a chair using your arms (e.g., wheelchair or bedside chair)?: None Help needed to walk in hospital room?: A Little Help needed climbing 3-5 steps with a railing? : A Little 6 Click Score: 22    End of Session Equipment Utilized During Treatment: Gait belt;Oxygen Activity Tolerance: Patient tolerated treatment well Patient left: in bed;with call bell/phone within reach;with bed alarm set;Other (comment) (with RT) Nurse Communication: Mobility status PT Visit Diagnosis: Muscle weakness (generalized) (M62.81);Difficulty in walking, not elsewhere classified (R26.2)     Time: 9157-9097 PT Time Calculation (min) (ACUTE ONLY): 20 min  Charges:    $Therapeutic Exercise: 8-22 mins PT General Charges $$ ACUTE PT VISIT: 1 Visit                     Kendra [name], PT, DPT Acute Rehabilitation Services  Personal: Secure Chat Rehab Office:  [phone]  Kendra [name] 07/11/2024, 11:00 AM

## 2024-07-11 NOTE — TOC Initial Note (Signed)
 Transition of Care [facility]) - Initial/Assessment Note    Patient Details  Name: Kendra [name] MRN: [id number] Date of Birth: [birth year]  Transition of Care [facility]) CM/SW Contact:    Marval Gell, RN Phone Number: 07/11/2024, 10:49 AM  Clinical Narrative:                  [name] w patient at bedside. Discussed home recs for PT and she is agreeable for [facility] services would like to have [place] again, and referral accepted. Updated HUB and AVS.  No ambulatory DME needed, will continue to follow for potential home oxygen needs. Spoke w bedside nurse, and goals are to wean and get ambulatory sats.    Expected Discharge Plan: Home w Home Health Services Barriers to Discharge: Continued Medical Work up   Patient Goals and CMS Choice Patient states their goals for this hospitalization and ongoing recovery are:: to go home CMS Medicare.gov Compare Post Acute Care list provided to:: Patient Choice offered to / list presented to : Patient      Expected Discharge Plan and Services   Discharge Planning Services: CM Consult Post Acute Care Choice: Home Health Living arrangements for the past 2 months: Single Family Home                           HH Arranged: PT HH Agency: [facility] Date [facility] Agency Contacted: 07/11/24 Time HH Agency Contacted: [phone] Representative spoke with at [facility] Agency: Darleene  Prior Living Arrangements/Services Living arrangements for the past 2 months: Single Family Home Lives with:: Self Patient language and need for interpreter reviewed:: Yes Do you feel safe going back to the place where you live?: Yes      Need for Family Participation in Patient Care: No (Comment)     Criminal Activity/Legal Involvement Pertinent to Current Situation/Hospitalization: No - Comment as needed  Activities of Daily Living   ADL Screening (condition at time of admission) Independently performs ADLs?: Yes (appropriate for developmental age) Is the patient deaf or have  difficulty hearing?: No Does the patient have difficulty seeing, even when wearing glasses/contacts?: No Does the patient have difficulty concentrating, remembering, or making decisions?: No  Permission Sought/Granted                  Emotional Assessment              Admission diagnosis:  Influenza A [J10.1] Acute respiratory failure with hypoxia (HCC) [J96.01] Multifocal pneumonia [J18.8] Acute hypoxic respiratory failure (HCC) [J96.01] COVID-19 [U07.1] Patient Active Problem List   Diagnosis Date Noted   Acute hypoxic respiratory failure (HCC) 07/09/2024   Influenza A with pneumonia 07/09/2024   COVID-19 virus infection 07/09/2024   Left hip pain 03/17/2023   S/P total left hip arthroplasty 03/01/2023   History of COVID-19 08/03/2021   Anemia 07/16/2021   Depression 07/16/2021   Degenerative disc disease, lumbar 07/16/2021   Mixed hyperlipidemia 07/16/2021   Peripheral edema 07/16/2021   Pre-diabetes 07/16/2021   Restless leg syndrome 07/16/2021   Sleep apnea 07/16/2021   Spinal stenosis at L4-L5 level 07/16/2021   Valvular heart disease 07/15/2021   Iron  deficiency anemia 12/29/2020   Lumbar spondylosis 05/21/2020   Atrophic vaginitis 12/26/2019   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   Popliteal pain 07/23/2019   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   Malaise and fatigue 05/25/2018   Morbid obesity with BMI of 40.0-44.9, adult (HCC) 11/17/2016   GERD without  esophagitis 10/08/2015   Primary hypothyroidism 10/08/2015   Benign essential hypertension 10/08/2015   Insomnia 11/27/2013   Spondylolisthesis, grade 2 11/27/2013   PCP:  Benson [name], NP Pharmacy:   [facility] - [place], [place] - [address] [place] [address] Phone: [phone] Fax: [phone]  [id number] [place], [place] - [address] AT [facility] [address] [place] [address] Phone:  [phone] Fax: [phone]     Social Drivers of Health (SDOH) Social History: SDOH Screenings   Food Insecurity: No Food Insecurity (07/10/2024)  Housing: Low Risk (07/10/2024)  Transportation Needs: No Transportation Needs (07/10/2024)  Utilities: Not At Risk (07/10/2024)  Social Connections: Moderately Integrated (07/10/2024)  Tobacco Use: Medium Risk (07/10/2024)   SDOH Interventions:     Readmission Risk Interventions     No data to display

## 2024-07-11 NOTE — Progress Notes (Signed)
SATURATION QUALIFICATIONS: (This note is used to comply with regulatory documentation for home oxygen)  Patient Saturations on Room Air at Rest = 94%  Patient Saturations on Room Air while Ambulating = 88%  Patient Saturations on 0 Liters of oxygen while Ambulating = 88%  Please briefly explain why patient needs home oxygen: 

## 2024-07-12 ENCOUNTER — Inpatient Hospital Stay (HOSPITAL_COMMUNITY)

## 2024-07-12 DIAGNOSIS — Z6841 Body Mass Index (BMI) 40.0 and over, adult: Secondary | ICD-10-CM | POA: Diagnosis not present

## 2024-07-12 DIAGNOSIS — J9601 Acute respiratory failure with hypoxia: Secondary | ICD-10-CM | POA: Diagnosis not present

## 2024-07-12 DIAGNOSIS — J188 Other pneumonia, unspecified organism: Secondary | ICD-10-CM

## 2024-07-12 DIAGNOSIS — U071 COVID-19: Secondary | ICD-10-CM | POA: Diagnosis not present

## 2024-07-12 DIAGNOSIS — G2581 Restless legs syndrome: Secondary | ICD-10-CM

## 2024-07-12 DIAGNOSIS — I10 Essential (primary) hypertension: Secondary | ICD-10-CM | POA: Diagnosis not present

## 2024-07-12 LAB — BASIC METABOLIC PANEL WITH GFR
Anion gap: 7 (ref 5–15)
BUN: 13 mg/dL (ref 8–23)
CO2: 39 mmol/L — ABNORMAL HIGH (ref 22–32)
Calcium: 9.1 mg/dL (ref 8.9–10.3)
Chloride: 95 mmol/L — ABNORMAL LOW (ref 98–111)
Creatinine, Ser: 0.49 mg/dL (ref 0.44–1.00)
GFR, Estimated: >60 mL/min (ref >60)
Glucose, Bld: 100 mg/dL — ABNORMAL HIGH (ref 70–99)
Potassium: 3.6 mmol/L (ref 3.5–5.1)
Sodium: 141 mmol/L (ref 135–145)

## 2024-07-12 LAB — CBC
HCT: 30.7 % — ABNORMAL LOW (ref 36.0–46.0)
Hemoglobin: 8.5 g/dL — ABNORMAL LOW (ref 12.0–15.0)
MCH: 20.6 pg — ABNORMAL LOW (ref 26.0–34.0)
MCHC: 27.7 g/dL — ABNORMAL LOW (ref 30.0–36.0)
MCV: 74.3 fL — ABNORMAL LOW (ref 80.0–100.0)
Platelets: 269 K/uL (ref 150–400)
RBC: 4.13 MIL/uL (ref 3.87–5.11)
RDW: 20.8 % — ABNORMAL HIGH (ref 11.5–15.5)
WBC: 8.5 K/uL (ref 4.0–10.5)
nRBC: 0 % (ref 0.0–0.2)

## 2024-07-12 LAB — GLUCOSE, CAPILLARY
Glucose-Capillary: 130 mg/dL — ABNORMAL HIGH (ref 70–99)
Glucose-Capillary: 90 mg/dL (ref 70–99)
Glucose-Capillary: 103 mg/dL — ABNORMAL HIGH (ref 70–99)
Glucose-Capillary: 150 mg/dL — ABNORMAL HIGH (ref 70–99)
Glucose-Capillary: 155 mg/dL — ABNORMAL HIGH (ref 70–99)
Glucose-Capillary: 112 mg/dL — ABNORMAL HIGH (ref 70–99)

## 2024-07-12 LAB — SEDIMENTATION RATE: Sed Rate: 12 mm/h (ref 0–22)

## 2024-07-12 LAB — C-REACTIVE PROTEIN: CRP: 3.3 mg/dL — ABNORMAL HIGH (ref <1.0)

## 2024-07-12 MED ORDER — GUAIFENESIN ER 600 MG PO TB12
1200.0000 mg | ORAL_TABLET | Freq: Two times a day (BID) | ORAL | Status: DC
  Administered 2024-07-12 – 2024-07-16 (×9): 1200 mg via ORAL
  Filled 2024-07-12 (×9): qty 2

## 2024-07-12 NOTE — Progress Notes (Signed)
 PROGRESS NOTE    [name]  FMW:[id number] DOB: [birth year] DOA: 07/09/2024 PCP: Benson [name], NP   Brief Narrative: Kendra [name] is a 78 y.o. female with a history of anemia of chronic disease, GERD, depression, anxiety, hypothyroidism, hyperlipidemia, restless leg syndrome, hypertension, GERD, obesity.  Patient presented secondary to shortness of breath and found to have multifocal pneumonia with COVID-19 and influenza infections.  Patient started on supplemental oxygen for management of hypoxia, in addition to remdesivir , Solu-Medrol , Tamiflu  for management of viral illnesses.    Assessment and Plan:  Acute respiratory failure with hypoxia Patient with shortness of breath and associated hypoxia requiring oxygen supplementation.  Secondary to pneumonia.  Patient reports some worsening dyspnea today. - Wean to room air as able - Ambulatory pulse ox prior to discharge - Check repeat chest x-ray - Continue flutter valve and get an incentive parameter to bedside  Multifocal pneumonia Influenza A infection COVID-19 virus infection Chest imaging significant for patchy infiltrates in the RUL, RML and left lingular concerning for multifocal pneumonia.  Patient started on empiric treatment for bacterial pneumonia with ceftriaxone  and azithromycin  in addition to treatment for COVID-19 infection with Solu-Medrol  and remdesivir  and finally treatment for influenza A infection with Tamiflu . - Continue ceftriaxone  and azithromycin  - Continue Tamiflu  - Continue remdesivir , Solu-Medrol   Iron  deficiency anemia Anemia of chronic disease Baseline hemoglobin of around 8 to 9 g/dL.  Hemoglobin of 7.9 on admission.  Hemoglobin dropped down to 6.9 with unclear urology output possibly related to illness no evidence of hemorrhaging.  Patient received 1 unit PRBC transfusion on 12/16 with posttransfusion hemoglobin of 8.0 g/dL. - Monitor CBC  Depression Anxiety Prior to arrival  medication(s) include alprazolam , clonazepam , fluoxetine . - Continue alprazolam  as needed, clonazepam  nightly, fluoxetine  daily  Hypothyroidism Prior to arrival medication(s) include levothyroxine . - Continue levothyroxine  daily  GERD Hiatal hernia Prior to arrival medication(s) include Protonix . - Continue Protonix  daily  Restless leg syndrome Prior to arrival medication(s) include pramipexole . - Continue pramipexole  3 times daily  Obesity, class III Estimated body mass index is 41.7 kg/m as calculated from the following:   Height as of this encounter: 5' 6 (1.676 m).   Weight as of this encounter: 117.2 kg.    DVT prophylaxis: Subcutaneous heparin  Code Status:   Code Status: Full Code Family Communication: None at bedside Disposition Plan: Home with home health once able to wean to room air, likely in 1 to 2 days   Consultants:  None  Procedures:  None  Antimicrobials: Ceftriaxone  Azithromycin  Remdesivir  Oseltamavir   Subjective: Patient reports worsening dyspnea today.  Continued cough with mild production.  Afebrile overnight.  Objective: BP 137/76 (BP Location: Right Arm)   Pulse (!) 58   Temp 97.6 F (36.4 C) (Oral)   Resp 17   Ht 5' 6 (1.676 m)   Wt 117.2 kg   SpO2 94%   BMI 41.70 kg/m   Examination:  General exam: Appears calm and slightly uncomfortable. Respiratory system: Rales bilaterally. Slight tachypnea. Cardiovascular system: S1 & S2 heard, RRR. Gastrointestinal system: Abdomen is nondistended, soft and nontender. Normal bowel sounds heard. Central nervous system: Alert and oriented. No focal neurological deficits. Musculoskeletal: No edema. No calf tenderness   Data Reviewed: I have personally reviewed following labs and imaging studies  CBC Lab Results  Component Value Date   WBC 8.0 07/11/2024   RBC 3.79 (L) 07/11/2024   HGB 8.0 (L) 07/11/2024   HCT 28.1 (L) 07/11/2024   MCV 74.1 (  L) 07/11/2024   MCH 21.1 (L) 07/11/2024    PLT 270 07/11/2024   MCHC 28.5 (L) 07/11/2024   RDW 20.0 (H) 07/11/2024   LYMPHSABS 0.5 (L) 07/09/2024   MONOABS 0.9 07/09/2024   EOSABS 0.0 07/09/2024   BASOSABS 0.0 07/09/2024     Last metabolic panel Lab Results  Component Value Date   NA 139 07/11/2024   K 4.2 07/11/2024   CL 100 07/11/2024   CO2 33 (H) 07/11/2024   BUN 14 07/11/2024   CREATININE 0.52 07/11/2024   GLUCOSE 153 (H) 07/11/2024   GFRNONAA >60 07/11/2024   CALCIUM  8.9 07/11/2024   PHOS 4.0 07/09/2024   PROT 6.5 07/09/2024   ALBUMIN  3.6 07/09/2024   BILITOT 0.3 07/09/2024   ALKPHOS 81 07/09/2024   AST 25 07/09/2024   ALT 16 07/09/2024   ANIONGAP 7 07/11/2024    GFR: Estimated Creatinine Clearance: 77.9 mL/min (by C-G formula based on SCr of 0.52 mg/dL).  Recent Results (from the past 240 hours)  Resp panel by RT-PCR (RSV, Flu A&B, Covid) Peripheral     Status: Abnormal   Collection Time: 07/09/24 10:11 AM   Specimen: Peripheral; Nasal Swab  Result Value Ref Range Status   SARS Coronavirus 2 by RT PCR POSITIVE (A) NEGATIVE Final   Influenza A by PCR POSITIVE (A) NEGATIVE Final   Influenza B by PCR NEGATIVE NEGATIVE Final    Comment: (NOTE) The Xpert Xpress SARS-CoV-2/FLU/RSV plus assay is intended as an aid in the diagnosis of influenza from Nasopharyngeal swab specimens and should not be used as a sole basis for treatment. Nasal washings and aspirates are unacceptable for Xpert Xpress SARS-CoV-2/FLU/RSV testing.  Fact Sheet for Patients: bloggercourse.com  Fact Sheet for Healthcare Providers: seriousbroker.it  This test is not yet approved or cleared by the [place]  FDA and has been authorized for detection and/or diagnosis of SARS-CoV-2 by FDA under an Emergency Use Authorization (EUA). This EUA will remain in effect (meaning this test can be used) for the duration of the COVID-19 declaration under Section 564(b)(1) of the Act, 21  U.S.C. section 360bbb-3(b)(1), unless the authorization is terminated or revoked.     Resp Syncytial Virus by PCR NEGATIVE NEGATIVE Final    Comment: (NOTE) Fact Sheet for Patients: bloggercourse.com  Fact Sheet for Healthcare Providers: seriousbroker.it  This test is not yet approved or cleared by the [place]  FDA and has been authorized for detection and/or diagnosis of SARS-CoV-2 by FDA under an Emergency Use Authorization (EUA). This EUA will remain in effect (meaning this test can be used) for the duration of the COVID-19 declaration under Section 564(b)(1) of the Act, 21 U.S.C. section 360bbb-3(b)(1), unless the authorization is terminated or revoked.  Performed at [facility], [address] [place], [place] [address]   Blood Culture (routine x 2)     Status: None (Preliminary result)   Collection Time: 07/09/24 10:11 AM   Specimen: BLOOD  Result Value Ref Range Status   Specimen Description BLOOD SITE NOT SPECIFIED  Final   Special Requests   Final    BOTTLES DRAWN AEROBIC AND ANAEROBIC Blood Culture results may not be optimal due to an inadequate volume of blood received in culture bottles   Culture   Final    NO GROWTH 2 DAYS Performed at [facility], [address], [place], [place] [address]    Report Status PENDING  Incomplete  Blood Culture (routine x 2)     Status: None (  Preliminary result)   Collection Time: 07/09/24 11:15 AM   Specimen: BLOOD  Result Value Ref Range Status   Specimen Description BLOOD SITE NOT SPECIFIED  Final   Special Requests   Final    BOTTLES DRAWN AEROBIC AND ANAEROBIC Blood Culture results may not be optimal due to an inadequate volume of blood received in culture bottles   Culture   Final    NO GROWTH 2 DAYS Performed at [facility], [address], [place], [place] [address]    Report Status PENDING  Incomplete      Radiology Studies: No results  found.    LOS: 2 days    Elgin Lam, MD [facility] 07/12/2024, 7:22 AM   If 7PM-7AM, please contact night-coverage [email or web address]

## 2024-07-12 NOTE — Plan of Care (Signed)
°  Problem: Education: Goal: Knowledge of risk factors and measures for prevention of condition will improve Outcome: Progressing   Problem: Coping: Goal: Psychosocial and spiritual needs will be supported Outcome: Progressing   Problem: Respiratory: Goal: Will maintain a patent airway Outcome: Progressing Goal: Complications related to the disease process, condition or treatment will be avoided or minimized Outcome: Progressing   Problem: Education: Goal: Ability to describe self-care measures that may prevent or decrease complications (Diabetes Survival Skills Education) will improve Outcome: Progressing Goal: Individualized Educational Video(s) Outcome: Progressing   Problem: Coping: Goal: Ability to adjust to condition or change in health will improve Outcome: Progressing   Problem: Nutritional: Goal: Maintenance of adequate nutrition will improve Outcome: Progressing Goal: Progress toward achieving an optimal weight will improve Outcome: Progressing   Problem: Clinical Measurements: Goal: Ability to maintain a body temperature in the normal range will improve Outcome: Progressing   Problem: Respiratory: Goal: Ability to maintain adequate ventilation will improve Outcome: Progressing Goal: Ability to maintain a clear airway will improve Outcome: Progressing

## 2024-07-13 ENCOUNTER — Other Ambulatory Visit (HOSPITAL_COMMUNITY): Payer: Self-pay

## 2024-07-13 DIAGNOSIS — I10 Essential (primary) hypertension: Secondary | ICD-10-CM | POA: Diagnosis not present

## 2024-07-13 DIAGNOSIS — J9601 Acute respiratory failure with hypoxia: Secondary | ICD-10-CM | POA: Diagnosis not present

## 2024-07-13 DIAGNOSIS — J188 Other pneumonia, unspecified organism: Secondary | ICD-10-CM | POA: Diagnosis not present

## 2024-07-13 DIAGNOSIS — Z6841 Body Mass Index (BMI) 40.0 and over, adult: Secondary | ICD-10-CM | POA: Diagnosis not present

## 2024-07-13 DIAGNOSIS — U071 COVID-19: Secondary | ICD-10-CM | POA: Diagnosis not present

## 2024-07-13 DIAGNOSIS — G2581 Restless legs syndrome: Secondary | ICD-10-CM | POA: Diagnosis not present

## 2024-07-13 LAB — GLUCOSE, CAPILLARY
Glucose-Capillary: 84 mg/dL (ref 70–99)
Glucose-Capillary: 139 mg/dL — ABNORMAL HIGH (ref 70–99)
Glucose-Capillary: 157 mg/dL — ABNORMAL HIGH (ref 70–99)
Glucose-Capillary: 153 mg/dL — ABNORMAL HIGH (ref 70–99)

## 2024-07-13 LAB — BASIC METABOLIC PANEL WITH GFR
Anion gap: 9 (ref 5–15)
BUN: 13 mg/dL (ref 8–23)
CO2: 36 mmol/L — ABNORMAL HIGH (ref 22–32)
Calcium: 8.7 mg/dL — ABNORMAL LOW (ref 8.9–10.3)
Chloride: 95 mmol/L — ABNORMAL LOW (ref 98–111)
Creatinine, Ser: 0.44 mg/dL (ref 0.44–1.00)
GFR, Estimated: >60 mL/min
Glucose, Bld: 80 mg/dL (ref 70–99)
Potassium: 3.8 mmol/L (ref 3.5–5.1)
Sodium: 140 mmol/L (ref 135–145)

## 2024-07-13 MED ORDER — FUROSEMIDE 10 MG/ML IJ SOLN
40.0000 mg | Freq: Two times a day (BID) | INTRAMUSCULAR | Status: DC
  Administered 2024-07-13 – 2024-07-14 (×2): 40 mg via INTRAVENOUS
  Filled 2024-07-13 (×2): qty 4

## 2024-07-13 MED ORDER — FUROSEMIDE 10 MG/ML IJ SOLN
40.0000 mg | Freq: Once | INTRAMUSCULAR | Status: AC
  Administered 2024-07-13: 40 mg via INTRAVENOUS
  Filled 2024-07-13: qty 4

## 2024-07-13 MED ORDER — SALINE SPRAY 0.65 % NA SOLN
1.0000 | NASAL | Status: DC | PRN
  Administered 2024-07-13: 1 via NASAL
  Filled 2024-07-13: qty 44

## 2024-07-13 MED ORDER — FUROSEMIDE 40 MG PO TABS: 40.0000 mg | ORAL_TABLET | Freq: Every day | ORAL | Status: DC

## 2024-07-13 NOTE — Plan of Care (Signed)
" °  Problem: Education: Goal: Knowledge of risk factors and measures for prevention of condition will improve Outcome: Progressing   Problem: Coping: Goal: Psychosocial and spiritual needs will be supported Outcome: Progressing   Problem: Respiratory: Goal: Will maintain a patent airway Outcome: Progressing   Problem: Respiratory: Goal: Will maintain a patent airway Outcome: Progressing Goal: Complications related to the disease process, condition or treatment will be avoided or minimized Outcome: Progressing   Problem: Education: Goal: Ability to describe self-care measures that may prevent or decrease complications (Diabetes Survival Skills Education) will improve Outcome: Progressing Goal: Individualized Educational Video(s) Outcome: Progressing   Problem: Coping: Goal: Ability to adjust to condition or change in health will improve Outcome: Progressing   Problem: Metabolic: Goal: Ability to maintain appropriate glucose levels will improve Outcome: Progressing   Problem: Nutritional: Goal: Maintenance of adequate nutrition will improve Outcome: Progressing Goal: Progress toward achieving an optimal weight will improve Outcome: Progressing   Problem: Activity: Goal: Ability to tolerate increased activity will improve Outcome: Progressing   Problem: Clinical Measurements: Goal: Ability to maintain a body temperature in the normal range will improve Outcome: Progressing   Problem: Respiratory: Goal: Ability to maintain adequate ventilation will improve Outcome: Progressing Goal: Ability to maintain a clear airway will improve Outcome: Progressing   "

## 2024-07-13 NOTE — Progress Notes (Signed)
 Occupational Therapy Treatment Patient Details Name: Kendra [name] MRN: [id number] DOB: 05-02-1946 Today's Date: 07/13/2024   History of present illness 78 y.o. female admitted 07/08/24 with SOB, cough; workup for acute hypoxic respiratory failure secondary to influenza A, SARS-CoV-2, multilobular PNA. PMH includes anemia, HTN, L THA, DDD, spinal cord stimulator, chronic pain, obesity, RLS.   OT comments  This 78 yo is moving relatively well--CGA and therapist managing lines/tubes (pulse ox and O2 tubing) from a RW level. Pt's issue is she is on 10 liters of HFNC and fatigues quickly with small amounts of activity. She will continue to benefit from acute OT with follow up now recommend as HHOT.      If plan is discharge home, recommend the following:  A little help with walking and/or transfers;A little help with bathing/dressing/bathroom;Assistance with cooking/housework;Assist for transportation;Help with stairs or ramp for entrance   Equipment Recommendations  None recommended by OT       Precautions / Restrictions Precautions Recall of Precautions/Restrictions: Intact Precaution/Restrictions Comments: watch SpO2 (does not wear baseline) Restrictions Weight Bearing Restrictions Per Provider Order: No       Mobility Bed Mobility               General bed mobility comments: Pt up in recliner upon arrival    Transfers Overall transfer level: Modified independent Equipment used: Rolling walker (2 wheels) Transfers: Sit to/from Stand             General transfer comment: ambulated to door and back in room 4 times (15 feet x4) with O2 no lower than 92% but with HR jump of 19 beats from rest (91-120)     Balance Overall balance assessment: Needs assistance Sitting-balance support: No upper extremity supported, Feet supported Sitting balance-Leahy Scale: Good     Standing balance support: No upper extremity supported, During functional activity Standing  balance-Leahy Scale: Fair Standing balance comment: standing for front peri care                           ADL either performed or assessed with clinical judgement   ADL Overall ADL's : Needs assistance/impaired             Lower Body Bathing: Sit to/from stand;Set up       Lower Body Dressing: Sit to/from stand;Set up   Toilet Transfer: Modified Independent;Stand-pivot;BSC/3in1   Toileting- Clothing Manipulation and Hygiene: Modified independent;Sit to/from stand              Extremity/Trunk Assessment Upper Extremity Assessment Upper Extremity Assessment: Overall WFL for tasks assessed            Vision Patient Visual Report: No change from baseline           Communication Communication Communication: No apparent difficulties   Cognition Arousal: Alert   Cognition: No apparent impairments                               Following commands: Intact        Cueing   Cueing Techniques: Verbal cues             Pertinent Vitals/ Pain       Pain Assessment Pain Assessment: No/denies pain         Frequency  Min 2X/week        Progress Toward Goals  OT Goals(current goals can now be found  in the care plan section)  Progress towards OT goals: Progressing toward goals  Acute Rehab OT Goals Patient Stated Goal: to feel and breath better OT Goal Formulation: With patient Time For Goal Achievement: 07/24/24 Potential to Achieve Goals: Good         AM-PAC OT 6 Clicks Daily Activity     Outcome Measure   Help from another person eating meals?: None Help from another person taking care of personal grooming?: A Little Help from another person toileting, which includes using toliet, bedpan, or urinal?: A Little Help from another person bathing (including washing, rinsing, drying)?: A Little Help from another person to put on and taking off regular upper body clothing?: A Little Help from another person to put on and  taking off regular lower body clothing?: A Little 6 Click Score: 19    End of Session Equipment Utilized During Treatment: Oxygen (10 L HFNC)  OT Visit Diagnosis: Unsteadiness on feet (R26.81);Other abnormalities of gait and mobility (R26.89);Muscle weakness (generalized) (M62.81)   Activity Tolerance Patient tolerated treatment well (for what she did--but was breathing heavier post our session)   Patient Left  (in wheelchair with RN in her new room)   Nurse Communication Mobility status        Time: 8954-8879 OT Time Calculation (min): 35 min  Charges: OT General Charges $OT Visit: 1 Visit OT Treatments $Self Care/Home Management : 23-37 mins  Kendra [name] OT Acute Rehabilitation Services Office [phone]    Kendra [name] 07/13/2024, 11:36 AM

## 2024-07-13 NOTE — Progress Notes (Signed)
 Report given to [facility] nurse Hailey.

## 2024-07-13 NOTE — Progress Notes (Signed)
 RT called to bedside due to pt feeling SOB and low O2 sats. Upon arrival pt sats were 84% on RA, RN had just taken pt off a breathing tx. RT placed pt on HFNC (salter) 10L sats came up to 96%. RT also informed pt the about the importance of using her Incentive spirometer and flutter valve due to pt stating she felt like she needed to cough stuff up but was having non-productive coughs.

## 2024-07-13 NOTE — Progress Notes (Signed)
 "  PROGRESS NOTE    Kendra [name]  FMW:[id number] DOB: 1946-01-11 DOA: 07/09/2024 PCP: Benson [name], NP   Brief Narrative: Kendra [name] is a 78 y.o. female with a history of anemia of chronic disease, GERD, depression, anxiety, hypothyroidism, hyperlipidemia, restless leg syndrome, hypertension, GERD, obesity.  Patient presented secondary to shortness of breath and found to have multifocal pneumonia with COVID-19 and influenza infections.  Patient started on supplemental oxygen for management of hypoxia, in addition to remdesivir , Solu-Medrol , Tamiflu  for management of viral illnesses. Patient with worsening hypoxia.    Assessment and Plan:  Acute respiratory failure with hypoxia Patient with shortness of breath and associated hypoxia requiring oxygen supplementation.  Secondary to pneumonia.  Patient with worsening hypoxia possibly secondary to worsening pulmonary edema. Patient now requiring up to 10 L/min of oxygen. Hopefully, not developing ARDS. - Wean to room air as able - Ambulatory pulse ox prior to discharge - Check repeat chest x-ray in AM - Continue flutter valve and get an incentive parameter to bedside - If continued worsening hypoxia/chest x-ray on diuresis, will likely need PCCM consultation  Multifocal pneumonia Influenza A infection COVID-19 virus infection Chest imaging significant for patchy infiltrates in the RUL, RML and left lingular concerning for multifocal pneumonia.  Patient started on empiric treatment for bacterial pneumonia with ceftriaxone  and azithromycin  in addition to treatment for COVID-19 infection with Solu-Medrol  and remdesivir  and finally treatment for influenza A infection with Tamiflu . Remdesivir  completed. Solu-medrol  transitioned to prednisone . - Continue ceftriaxone  and azithromycin  - Continue Tamiflu  - Continue prednisone   Acute pulmonary edema In setting of lung infection. Worsening hypoxia. - Lasix  IV - Daily BMP while  on Lasix  IV  Iron  deficiency anemia Anemia of chronic disease Baseline hemoglobin of around 8 to 9 g/dL.  Hemoglobin of 7.9 on admission.  Hemoglobin dropped down to 6.9 with unclear urology output possibly related to illness no evidence of hemorrhaging.  Patient received 1 unit PRBC transfusion on 12/16 with posttransfusion hemoglobin of 8.0 g/dL. - Monitor CBC  Depression Anxiety Prior to arrival medication(s) include alprazolam , clonazepam , fluoxetine . - Continue alprazolam  as needed, clonazepam  nightly, fluoxetine  daily  Hypothyroidism Prior to arrival medication(s) include levothyroxine . - Continue levothyroxine  daily  GERD Hiatal hernia Prior to arrival medication(s) include Protonix . - Continue Protonix  daily  Restless leg syndrome Prior to arrival medication(s) include pramipexole . - Continue pramipexole  3 times daily  Obesity, class III Estimated body mass index is 41.81 kg/m as calculated from the following:   Height as of this encounter: 5' 6 (1.676 m).   Weight as of this encounter: 117.5 kg.    DVT prophylaxis: Subcutaneous heparin  Code Status:   Code Status: Full Code Family Communication: None at bedside Disposition Plan: Home with home health once able to wean to room air, likely in 3-5 days   Consultants:  None  Procedures:  None  Antimicrobials: Ceftriaxone  Azithromycin  Remdesivir  Oseltamavir   Subjective: Worsening dyspnea this morning requiring escalation of oxygen delivery. Patient reports significant shortness of breath at that time. Patient with improvement of symptoms after increase in oxygen delivery.   Objective: BP (!) 143/93 (BP Location: Left Arm)   Pulse 68   Temp 97.7 F (36.5 C) (Oral)   Resp (!) 22   Ht 5' 6 (1.676 m)   Wt 117.5 kg   SpO2 (!) 84% Comment: observed upon arrival into pt room  BMI 41.81 kg/m   Examination:  General exam: Appears calm and slightly uncomfortable. Respiratory system: Rales bilaterally.  Slight tachypnea. Cardiovascular system: S1 & S2 heard, RRR. Gastrointestinal system: Abdomen is nondistended, soft and nontender. Normal bowel sounds heard. Central nervous system: Alert and oriented. No focal neurological deficits. Musculoskeletal: No edema. No calf tenderness   Data Reviewed: I have personally reviewed following labs and imaging studies  CBC Lab Results  Component Value Date   WBC 8.5 07/12/2024   RBC 4.13 07/12/2024   HGB 8.5 (L) 07/12/2024   HCT 30.7 (L) 07/12/2024   MCV 74.3 (L) 07/12/2024   MCH 20.6 (L) 07/12/2024   PLT 269 07/12/2024   MCHC 27.7 (L) 07/12/2024   RDW 20.8 (H) 07/12/2024   LYMPHSABS 0.5 (L) 07/09/2024   MONOABS 0.9 07/09/2024   EOSABS 0.0 07/09/2024   BASOSABS 0.0 07/09/2024     Last metabolic panel Lab Results  Component Value Date   NA 140 07/13/2024   K 3.8 07/13/2024   CL 95 (L) 07/13/2024   CO2 36 (H) 07/13/2024   BUN 13 07/13/2024   CREATININE 0.44 07/13/2024   GLUCOSE 80 07/13/2024   GFRNONAA >60 07/13/2024   CALCIUM  8.7 (L) 07/13/2024   PHOS 4.0 07/09/2024   PROT 6.5 07/09/2024   ALBUMIN  3.6 07/09/2024   BILITOT 0.3 07/09/2024   ALKPHOS 81 07/09/2024   AST 25 07/09/2024   ALT 16 07/09/2024   ANIONGAP 9 07/13/2024    GFR: Estimated Creatinine Clearance: 78 mL/min (by C-G formula based on SCr of 0.44 mg/dL).  Recent Results (from the past 240 hours)  Resp panel by RT-PCR (RSV, Flu A&B, Covid) Peripheral     Status: Abnormal   Collection Time: 07/09/24 10:11 AM   Specimen: Peripheral; Nasal Swab  Result Value Ref Range Status   SARS Coronavirus 2 by RT PCR POSITIVE (A) NEGATIVE Final   Influenza A by PCR POSITIVE (A) NEGATIVE Final   Influenza B by PCR NEGATIVE NEGATIVE Final    Comment: (NOTE) The Xpert Xpress SARS-CoV-2/FLU/RSV plus assay is intended as an aid in the diagnosis of influenza from Nasopharyngeal swab specimens and should not be used as a sole basis for treatment. Nasal washings and aspirates  are unacceptable for Xpert Xpress SARS-CoV-2/FLU/RSV testing.  Fact Sheet for Patients: bloggercourse.com  Fact Sheet for Healthcare Providers: seriousbroker.it  This test is not yet approved or cleared by the [place]  FDA and has been authorized for detection and/or diagnosis of SARS-CoV-2 by FDA under an Emergency Use Authorization (EUA). This EUA will remain in effect (meaning this test can be used) for the duration of the COVID-19 declaration under Section 564(b)(1) of the Act, 21 U.S.C. section 360bbb-3(b)(1), unless the authorization is terminated or revoked.     Resp Syncytial Virus by PCR NEGATIVE NEGATIVE Final    Comment: (NOTE) Fact Sheet for Patients: bloggercourse.com  Fact Sheet for Healthcare Providers: seriousbroker.it  This test is not yet approved or cleared by the [place]  FDA and has been authorized for detection and/or diagnosis of SARS-CoV-2 by FDA under an Emergency Use Authorization (EUA). This EUA will remain in effect (meaning this test can be used) for the duration of the COVID-19 declaration under Section 564(b)(1) of the Act, 21 U.S.C. section 360bbb-3(b)(1), unless the authorization is terminated or revoked.  Performed at [facility], [address], [place], [place] [address]   Blood Culture (routine x 2)     Status: None (Preliminary result)   Collection Time: 07/09/24 10:11 AM   Specimen: BLOOD  Result Value Ref Range Status   Specimen Description BLOOD  SITE NOT SPECIFIED  Final   Special Requests   Final    BOTTLES DRAWN AEROBIC AND ANAEROBIC Blood Culture results may not be optimal due to an inadequate volume of blood received in culture bottles   Culture   Final    NO GROWTH 4 DAYS Performed at [facility], [address], [place], [place] [address]    Report Status PENDING  Incomplete  Blood Culture (routine x  2)     Status: None (Preliminary result)   Collection Time: 07/09/24 11:15 AM   Specimen: BLOOD  Result Value Ref Range Status   Specimen Description BLOOD SITE NOT SPECIFIED  Final   Special Requests   Final    BOTTLES DRAWN AEROBIC AND ANAEROBIC Blood Culture results may not be optimal due to an inadequate volume of blood received in culture bottles   Culture   Final    NO GROWTH 4 DAYS Performed at [facility], [address] [place], [place] [address]    Report Status PENDING  Incomplete      Radiology Studies: DG CHEST PORT 1 VIEW Result Date: 07/12/2024 EXAM: 1 VIEW(S) XRAY OF THE CHEST 07/12/2024 02:56:00 PM COMPARISON: 07/09/2024 CLINICAL HISTORY: Shortness of breath 10026; 200808 Hypoxia 200808 FINDINGS: LINES, TUBES AND DEVICES: Neurostimulator leads noted overlying the midthoracic spine. LUNGS AND PLEURA: Vascular congestion is noted. Basilar atelectatic changes are seen. Possible trace bilateral pleural effusions. No focal pulmonary opacity. No pneumothorax. HEART AND MEDIASTINUM: Mild cardiomegaly. BONES AND SOFT TISSUES: No acute osseous abnormality. IMPRESSION: 1. Vascular congestion and mild cardiomegaly. 2. Possible trace bilateral pleural effusions. 3. Basilar atelectatic changes. Electronically signed by: Oneil Devonshire MD 07/12/2024 07:55 PM EST RP Workstation: [id number]      LOS: 3 days    Elgin Lam, MD [facility] 07/13/2024, 11:39 AM   If 7PM-7AM, please contact night-coverage [email or web address]  "

## 2024-07-13 NOTE — Plan of Care (Signed)
" °  Problem: Education: Goal: Knowledge of risk factors and measures for prevention of condition will improve Outcome: Progressing   Problem: Coping: Goal: Psychosocial and spiritual needs will be supported Outcome: Progressing   Problem: Respiratory: Goal: Will maintain a patent airway Outcome: Progressing Goal: Complications related to the disease process, condition or treatment will be avoided or minimized Outcome: Progressing   Problem: Education: Goal: Ability to describe self-care measures that may prevent or decrease complications (Diabetes Survival Skills Education) will improve Outcome: Progressing Goal: Individualized Educational Video(s) Outcome: Progressing   Problem: Coping: Goal: Ability to adjust to condition or change in health will improve Outcome: Progressing   Problem: Fluid Volume: Goal: Ability to maintain a balanced intake and output will improve Outcome: Progressing   Problem: Health Behavior/Discharge Planning: Goal: Ability to identify and utilize available resources and services will improve Outcome: Progressing Goal: Ability to manage health-related needs will improve Outcome: Progressing   Problem: Metabolic: Goal: Ability to maintain appropriate glucose levels will improve Outcome: Progressing   Problem: Nutritional: Goal: Maintenance of adequate nutrition will improve Outcome: Progressing Goal: Progress toward achieving an optimal weight will improve Outcome: Progressing   Problem: Skin Integrity: Goal: Risk for impaired skin integrity will decrease Outcome: Progressing   Problem: Tissue Perfusion: Goal: Adequacy of tissue perfusion will improve Outcome: Progressing   Problem: Activity: Goal: Ability to tolerate increased activity will improve Outcome: Progressing   Problem: Clinical Measurements: Goal: Ability to maintain a body temperature in the normal range will improve Outcome: Progressing   Problem: Respiratory: Goal:  Ability to maintain adequate ventilation will improve Outcome: Progressing Goal: Ability to maintain a clear airway will improve Outcome: Progressing   Problem: Education: Goal: Knowledge of General Education information will improve Description: Including pain rating scale, medication(s)/side effects and non-pharmacologic comfort measures Outcome: Progressing   Problem: Health Behavior/Discharge Planning: Goal: Ability to manage health-related needs will improve Outcome: Progressing   Problem: Clinical Measurements: Goal: Ability to maintain clinical measurements within normal limits will improve Outcome: Progressing Goal: Will remain free from infection Outcome: Progressing Goal: Diagnostic test results will improve Outcome: Progressing Goal: Respiratory complications will improve Outcome: Progressing Goal: Cardiovascular complication will be avoided Outcome: Progressing   Problem: Activity: Goal: Risk for activity intolerance will decrease Outcome: Progressing   Problem: Nutrition: Goal: Adequate nutrition will be maintained Outcome: Progressing   Problem: Coping: Goal: Level of anxiety will decrease Outcome: Progressing   Problem: Elimination: Goal: Will not experience complications related to bowel motility Outcome: Progressing Goal: Will not experience complications related to urinary retention Outcome: Progressing   Problem: Pain Managment: Goal: General experience of comfort will improve and/or be controlled Outcome: Progressing   Problem: Safety: Goal: Ability to remain free from injury will improve Outcome: Progressing   Problem: Skin Integrity: Goal: Risk for impaired skin integrity will decrease Outcome: Progressing

## 2024-07-14 ENCOUNTER — Inpatient Hospital Stay (HOSPITAL_COMMUNITY)

## 2024-07-14 DIAGNOSIS — J09X1 Influenza due to identified novel influenza A virus with pneumonia: Secondary | ICD-10-CM

## 2024-07-14 DIAGNOSIS — I10 Essential (primary) hypertension: Secondary | ICD-10-CM | POA: Diagnosis not present

## 2024-07-14 DIAGNOSIS — J9601 Acute respiratory failure with hypoxia: Secondary | ICD-10-CM | POA: Diagnosis not present

## 2024-07-14 DIAGNOSIS — U071 COVID-19: Secondary | ICD-10-CM | POA: Diagnosis not present

## 2024-07-14 LAB — CBC
HCT: 32.2 % — ABNORMAL LOW (ref 36.0–46.0)
Hemoglobin: 9.1 g/dL — ABNORMAL LOW (ref 12.0–15.0)
MCH: 20.8 pg — ABNORMAL LOW (ref 26.0–34.0)
MCHC: 28.3 g/dL — ABNORMAL LOW (ref 30.0–36.0)
MCV: 73.5 fL — ABNORMAL LOW (ref 80.0–100.0)
Platelets: 263 K/uL (ref 150–400)
RBC: 4.38 MIL/uL (ref 3.87–5.11)
RDW: 21.2 % — ABNORMAL HIGH (ref 11.5–15.5)
WBC: 5.6 K/uL (ref 4.0–10.5)
nRBC: 0 % (ref 0.0–0.2)

## 2024-07-14 LAB — GLUCOSE, CAPILLARY
Glucose-Capillary: 104 mg/dL — ABNORMAL HIGH (ref 70–99)
Glucose-Capillary: 127 mg/dL — ABNORMAL HIGH (ref 70–99)
Glucose-Capillary: 138 mg/dL — ABNORMAL HIGH (ref 70–99)
Glucose-Capillary: 273 mg/dL — ABNORMAL HIGH (ref 70–99)
Glucose-Capillary: 127 mg/dL — ABNORMAL HIGH (ref 70–99)

## 2024-07-14 LAB — CULTURE, BLOOD (ROUTINE X 2)
Culture: NO GROWTH
Culture: NO GROWTH

## 2024-07-14 LAB — BASIC METABOLIC PANEL WITH GFR
Anion gap: 7 (ref 5–15)
BUN: 11 mg/dL (ref 8–23)
CO2: 45 mmol/L — ABNORMAL HIGH (ref 22–32)
Calcium: 9 mg/dL (ref 8.9–10.3)
Chloride: 92 mmol/L — ABNORMAL LOW (ref 98–111)
Creatinine, Ser: 0.45 mg/dL (ref 0.44–1.00)
GFR, Estimated: >60 mL/min
Glucose, Bld: 95 mg/dL (ref 70–99)
Potassium: 3.3 mmol/L — ABNORMAL LOW (ref 3.5–5.1)
Sodium: 143 mmol/L (ref 135–145)

## 2024-07-14 MED ORDER — POTASSIUM CHLORIDE CRYS ER 20 MEQ PO TBCR
40.0000 meq | EXTENDED_RELEASE_TABLET | Freq: Once | ORAL | Status: AC
  Administered 2024-07-14: 40 meq via ORAL
  Filled 2024-07-14: qty 2

## 2024-07-14 MED ORDER — FUROSEMIDE 40 MG PO TABS
40.0000 mg | ORAL_TABLET | Freq: Every day | ORAL | Status: DC
  Administered 2024-07-14: 40 mg via ORAL
  Filled 2024-07-14 (×2): qty 1

## 2024-07-14 MED ORDER — FUROSEMIDE 40 MG PO TABS: 40.0000 mg | ORAL_TABLET | Freq: Every day | ORAL | Status: DC

## 2024-07-14 MED ORDER — PREDNISONE 20 MG PO TABS
40.0000 mg | ORAL_TABLET | Freq: Every day | ORAL | Status: DC
  Administered 2024-07-15 – 2024-07-16 (×2): 40 mg via ORAL
  Filled 2024-07-14 (×2): qty 2

## 2024-07-14 NOTE — Progress Notes (Signed)
 Patient dropped to 86% when ambulating on 5 Liters nasal cannula .

## 2024-07-14 NOTE — Progress Notes (Signed)
 "                        PROGRESS NOTE        PATIENT DETAILS Name: Kendra [name] Age: 78 y.o. Sex: female Date of Birth: 02/03/1946 Admit Date: 07/09/2024 Admitting Physician Adriana DELENA Grams, MD ERE:[id number], [name], NP  Brief Summary: Patient is a 78 y.o.  female with history of HTN, HLD, hypothyroidism, depression/anxiety, OSA who presented with shortness of breath-found to have acute hypoxic respiratory failure secondary to COVID/influenza pneumonia.  Significant events: 12/15>> admit to [facility]  Significant studies: 12/15>> CTA chest: No PE-patchy infiltrates-multifocal PNA.-  Significant microbiology data: 12/15>> COVID PCR/influenza A: Positive 12/15>> influenza B/RSV PCR: Negative 12/15>> blood cultures: No growth  Procedures: None  Consults: None  Subjective: Feels overall better-Down to 2-2 L of oxygen (on 7 L overnight-not yet back on CPAP).  Acknowledges using CPAP at home  Objective: Vitals: Blood pressure (!) 147/68, pulse (!) 57, temperature 98 F (36.7 C), temperature source Oral, resp. rate (!) 22, height 5' 6 (1.676 m), weight 117.2 kg, SpO2 99%.   Exam: Gen Exam:Alert awake-not in any distress HEENT:atraumatic, normocephalic Chest: B/L clear to auscultation anteriorly CVS:S1S2 regular Abdomen:soft non tender, non distended Extremities:no edema Neurology: Non focal Skin: no rash  Pertinent Labs/Radiology:    Latest Ref Rng & Units 07/14/2024    7:42 AM 07/12/2024    9:42 AM 07/11/2024    4:33 AM  CBC  WBC 4.0 - 10.5 K/uL 5.6  8.5  8.0   Hemoglobin 12.0 - 15.0 g/dL 9.1  8.5  8.0   Hematocrit 36.0 - 46.0 % 32.2  30.7  28.1   Platelets 150 - 400 K/uL 263  269  270     Lab Results  Component Value Date   NA 143 07/14/2024   K 3.3 (L) 07/14/2024   CL 92 (L) 07/14/2024   CO2 45 (H) 07/14/2024      Assessment/Plan: Acute hypoxic respiratory failure secondary to multifocal COVID/influenza A PNA Improved-at one point  required up to 10 L of HFNC-Down to 2-3 L Suspect we can go ahead and stop antibiotics-as unlikely to be bacterial pneumonia. Continue Tamiflu  and complete 5-day course Decrease prednisone  to 40 mg-Will plan on a 10-day course. Titrate down oxygen further Incentive spirometry/flutter valve Mobilize as much as possible Check ambulatory O2 screen-May require home oxygen short-term Resume CPAP nightly-has at home as well  Acute on chronic HFpEF Precipitated by infection Volume status has stabilized Switch to oral Lasix -keeping negative balance Check updated echo  Hypokalemia Replete/recheck  Multifactorial anemia Secondary to anemia of acute illness/chronic disease/iron  deficiency 1 unit of PRBC on 12/16-Hb stable since then Follow periodically  Hypothyroidism Synthroid   Anxiety/depression Continue Prozac /clonazepam  As needed Xanax   RLS Pramipexole   GERD/hiatal hernia PPI  OSA CPAP nightly-discussed with RN/RT  Class 3 Obesity: Estimated body mass index is 41.7 kg/m as calculated from the following:   Height as of this encounter: 5' 6 (1.676 m).   Weight as of this encounter: 117.2 kg.   Code status:   Code Status: Full Code   DVT Prophylaxis: heparin  injection 5,000 Units Start: 07/09/24 1500 SCDs Start: 07/09/24 1448    Family Communication: None at bedside   Disposition Plan: Status is: Inpatient Remains inpatient appropriate because: Severity of illness   Planned Discharge Destination:Home health   Diet: Diet Order             Diet regular Room  service appropriate? Yes; Fluid consistency: Thin  Diet effective now                     Antimicrobial agents: Anti-infectives (From admission, onward)    Start     Dose/Rate Route Frequency Ordered Stop   07/11/24 1245  azithromycin  (ZITHROMAX ) tablet 500 mg        500 mg Oral Daily 07/11/24 1158 07/14/24 0830   07/10/24 1400  azithromycin  (ZITHROMAX ) 500 mg in dextrose  5 % 250 mL IVPB   Status:  Discontinued        500 mg 255 mL/hr over 60 Minutes Intravenous Every 24 hours 07/09/24 1516 07/11/24 1158   07/10/24 1000  remdesivir  100 mg in sodium chloride  0.9 % 100 mL IVPB       Placed in Followed by Linked Group   100 mg 200 mL/hr over 30 Minutes Intravenous Daily 07/09/24 1518 07/11/24 1054   07/09/24 1530  cefTRIAXone  (ROCEPHIN ) 1 g in sodium chloride  0.9 % 100 mL IVPB        1 g 200 mL/hr over 30 Minutes Intravenous Every 24 hours 07/09/24 1516     07/09/24 1515  levofloxacin  (LEVAQUIN ) IVPB 750 mg  Status:  Discontinued        750 mg 100 mL/hr over 90 Minutes Intravenous Every 24 hours 07/09/24 1501 07/09/24 1516   07/09/24 1515  remdesivir  200 mg in sodium chloride  0.9% 250 mL IVPB       Placed in Followed by Linked Group   200 mg 580 mL/hr over 30 Minutes Intravenous Once 07/09/24 1518 07/09/24 2058   07/09/24 1500  oseltamivir  (TAMIFLU ) capsule 75 mg        75 mg Oral 2 times daily 07/09/24 1447     07/09/24 1445  oseltamivir  (TAMIFLU ) capsule 75 mg        75 mg Oral  Once 07/09/24 1431 07/09/24 1438   07/09/24 1015  levofloxacin  (LEVAQUIN ) IVPB 750 mg        750 mg 100 mL/hr over 90 Minutes Intravenous  Once 07/09/24 1013 07/09/24 1312        MEDICATIONS: Scheduled Meds:  acetaminophen   1,000 mg Oral Q8H   Or   acetaminophen   650 mg Rectal Q8H   buPROPion   150 mg Oral Daily   clonazePAM   1 mg Oral QHS   FLUoxetine   20 mg Oral Daily   furosemide   40 mg Intravenous BID   guaiFENesin   1,200 mg Oral BID   heparin   5,000 Units Subcutaneous Q8H   insulin  aspart  0-6 Units Subcutaneous TID WC   ipratropium-albuterol   3 mL Nebulization TID   levothyroxine   88 mcg Oral QAC breakfast   oseltamivir   75 mg Oral BID   pantoprazole   40 mg Oral Daily   pramipexole   2 mg Oral TID   predniSONE   80 mg Oral Q breakfast   sodium chloride  flush  3 mL Intravenous Q12H   sodium chloride  flush  3 mL Intravenous Q12H   zinc  sulfate (50mg  elemental zinc )  220 mg  Oral Daily   Continuous Infusions:  cefTRIAXone  (ROCEPHIN )  IV 1 g (07/13/24 1525)   PRN Meds:.ALPRAZolam , bisacodyl , hydrALAZINE , HYDROmorphone  (DILAUDID ) injection, ipratropium, ondansetron  **OR** ondansetron  (ZOFRAN ) IV, oxyCODONE , senna-docusate, sodium chloride , sodium phosphate , SUMAtriptan , traZODone    I have personally reviewed following labs and imaging studies  LABORATORY DATA: CBC: Recent Labs  Lab 07/09/24 1122 07/09/24 1135 07/09/24 1136 07/10/24 0515 07/11/24 0433 07/12/24 9057 07/14/24 9257  WBC 8.1  --   --  5.6 8.0 8.5 5.6  NEUTROABS 6.5  --   --   --   --   --   --   HGB 7.9*   < > 9.9* 6.9* 8.0* 8.5* 9.1*  HCT 28.2*   < > 29.0* 24.9* 28.1* 30.7* 32.2*  MCV 70.9*  --   --  72.6* 74.1* 74.3* 73.5*  PLT 330  --   --  285 270 269 263   < > = values in this interval not displayed.    Basic Metabolic Panel: Recent Labs  Lab 07/09/24 2128 07/10/24 0515 07/11/24 0433 07/12/24 0942 07/13/24 0547 07/14/24 0742  NA  --  139 139 141 140 143  K  --  4.0 4.2 3.6 3.8 3.3*  CL  --  101 100 95* 95* 92*  CO2  --  29 33* 39* 36* 45*  GLUCOSE  --  164* 153* 100* 80 95  BUN  --  10 14 13 13 11   CREATININE  --  0.59 0.52 0.49 0.44 0.45  CALCIUM   --  8.6* 8.9 9.1 8.7* 9.0  MG 1.8  --   --   --   --   --   PHOS 4.0  --   --   --   --   --     GFR: Estimated Creatinine Clearance: 77.9 mL/min (by C-G formula based on SCr of 0.45 mg/dL).  Liver Function Tests: Recent Labs  Lab 07/09/24 1122  AST 25  ALT 16  ALKPHOS 81  BILITOT 0.3  PROT 6.5  ALBUMIN  3.6   No results for input(s): LIPASE, AMYLASE in the last 168 hours. No results for input(s): AMMONIA in the last 168 hours.  Coagulation Profile: Recent Labs  Lab 07/09/24 1122  INR 1.2    Cardiac Enzymes: No results for input(s): CKTOTAL, CKMB, CKMBINDEX, TROPONINI in the last 168 hours.  BNP (last 3 results) No results for input(s): PROBNP in the last 8760 hours.  Lipid  Profile: No results for input(s): CHOL, HDL, LDLCALC, TRIG, CHOLHDL, LDLDIRECT in the last 72 hours.  Thyroid  Function Tests: No results for input(s): TSH, T4TOTAL, FREET4, T3FREE, THYROIDAB in the last 72 hours.  Anemia Panel: No results for input(s): VITAMINB12, FOLATE, FERRITIN, TIBC, IRON , RETICCTPCT in the last 72 hours.  Urine analysis:    Component Value Date/Time   COLORURINE STRAW (A) 12/08/2020 1108   APPEARANCEUR CLEAR 12/08/2020 1108   LABSPEC 1.006 12/08/2020 1108   PHURINE 7.0 12/08/2020 1108   GLUCOSEU NEGATIVE 12/08/2020 1108   HGBUR NEGATIVE 12/08/2020 1108   BILIRUBINUR NEGATIVE 12/08/2020 1108   KETONESUR NEGATIVE 12/08/2020 1108   PROTEINUR NEGATIVE 12/08/2020 1108   NITRITE NEGATIVE 12/08/2020 1108   LEUKOCYTESUR NEGATIVE 12/08/2020 1108    Sepsis Labs: Lactic Acid, Venous    Component Value Date/Time   LATICACIDVEN 0.6 07/09/2024 1136    MICROBIOLOGY: Recent Results (from the past 240 hours)  Resp panel by RT-PCR (RSV, Flu A&B, Covid) Peripheral     Status: Abnormal   Collection Time: 07/09/24 10:11 AM   Specimen: Peripheral; Nasal Swab  Result Value Ref Range Status   SARS Coronavirus 2 by RT PCR POSITIVE (A) NEGATIVE Final   Influenza A by PCR POSITIVE (A) NEGATIVE Final   Influenza B by PCR NEGATIVE NEGATIVE Final    Comment: (NOTE) The Xpert Xpress SARS-CoV-2/FLU/RSV plus assay is intended as an aid in the diagnosis of influenza from Nasopharyngeal swab specimens and  should not be used as a sole basis for treatment. Nasal washings and aspirates are unacceptable for Xpert Xpress SARS-CoV-2/FLU/RSV testing.  Fact Sheet for Patients: bloggercourse.com  Fact Sheet for Healthcare Providers: seriousbroker.it  This test is not yet approved or cleared by the [place]  FDA and has been authorized for detection and/or diagnosis of SARS-CoV-2 by FDA under an  Emergency Use Authorization (EUA). This EUA will remain in effect (meaning this test can be used) for the duration of the COVID-19 declaration under Section 564(b)(1) of the Act, 21 U.S.C. section 360bbb-3(b)(1), unless the authorization is terminated or revoked.     Resp Syncytial Virus by PCR NEGATIVE NEGATIVE Final    Comment: (NOTE) Fact Sheet for Patients: bloggercourse.com  Fact Sheet for Healthcare Providers: seriousbroker.it  This test is not yet approved or cleared by the [place]  FDA and has been authorized for detection and/or diagnosis of SARS-CoV-2 by FDA under an Emergency Use Authorization (EUA). This EUA will remain in effect (meaning this test can be used) for the duration of the COVID-19 declaration under Section 564(b)(1) of the Act, 21 U.S.C. section 360bbb-3(b)(1), unless the authorization is terminated or revoked.  Performed at [facility], [address] [place], [place] [address]   Blood Culture (routine x 2)     Status: None   Collection Time: 07/09/24 10:11 AM   Specimen: BLOOD  Result Value Ref Range Status   Specimen Description BLOOD SITE NOT SPECIFIED  Final   Special Requests   Final    BOTTLES DRAWN AEROBIC AND ANAEROBIC Blood Culture results may not be optimal due to an inadequate volume of blood received in culture bottles   Culture   Final    NO GROWTH 5 DAYS Performed at [facility], [address], [place], [place] [address]    Report Status 07/14/2024 FINAL  Final  Blood Culture (routine x 2)     Status: None   Collection Time: 07/09/24 11:15 AM   Specimen: BLOOD  Result Value Ref Range Status   Specimen Description BLOOD SITE NOT SPECIFIED  Final   Special Requests   Final    BOTTLES DRAWN AEROBIC AND ANAEROBIC Blood Culture results may not be optimal due to an inadequate volume of blood received in culture bottles   Culture   Final    NO GROWTH 5 DAYS Performed  at [facility], [address] [place], [place] [address]    Report Status 07/14/2024 FINAL  Final    RADIOLOGY STUDIES/RESULTS: DG CHEST PORT 1 VIEW Result Date: 07/12/2024 EXAM: 1 VIEW(S) XRAY OF THE CHEST 07/12/2024 02:56:00 PM COMPARISON: 07/09/2024 CLINICAL HISTORY: Shortness of breath 10026; 200808 Hypoxia 200808 FINDINGS: LINES, TUBES AND DEVICES: Neurostimulator leads noted overlying the midthoracic spine. LUNGS AND PLEURA: Vascular congestion is noted. Basilar atelectatic changes are seen. Possible trace bilateral pleural effusions. No focal pulmonary opacity. No pneumothorax. HEART AND MEDIASTINUM: Mild cardiomegaly. BONES AND SOFT TISSUES: No acute osseous abnormality. IMPRESSION: 1. Vascular congestion and mild cardiomegaly. 2. Possible trace bilateral pleural effusions. 3. Basilar atelectatic changes. Electronically signed by: Oneil Devonshire MD 07/12/2024 07:55 PM EST RP Workstation: [id number]     LOS: 4 days   Donalda Applebaum, MD  [facility]    To contact the attending provider between 7A-7P or the covering provider during after hours 7P-7A, please log into the web site [email or web address] and access using universal [place] password for that web site. If you do not have the password,  please call the hospital operator.  07/14/2024, 9:29 AM    "

## 2024-07-14 NOTE — Plan of Care (Signed)
°  Problem: Education: Goal: Knowledge of risk factors and measures for prevention of condition will improve Outcome: Progressing   Problem: Coping: Goal: Psychosocial and spiritual needs will be supported Outcome: Progressing   Problem: Respiratory: Goal: Will maintain a patent airway Outcome: Progressing Goal: Complications related to the disease process, condition or treatment will be avoided or minimized Outcome: Progressing   Problem: Education: Goal: Ability to describe self-care measures that may prevent or decrease complications (Diabetes Survival Skills Education) will improve Outcome: Progressing Goal: Individualized Educational Video(s) Outcome: Progressing   Problem: Coping: Goal: Ability to adjust to condition or change in health will improve Outcome: Progressing   Problem: Fluid Volume: Goal: Ability to maintain a balanced intake and output will improve Outcome: Progressing   Problem: Health Behavior/Discharge Planning: Goal: Ability to identify and utilize available resources and services will improve Outcome: Progressing Goal: Ability to manage health-related needs will improve Outcome: Progressing   Problem: Metabolic: Goal: Ability to maintain appropriate glucose levels will improve Outcome: Progressing   Problem: Nutritional: Goal: Maintenance of adequate nutrition will improve Outcome: Progressing Goal: Progress toward achieving an optimal weight will improve Outcome: Progressing   Problem: Skin Integrity: Goal: Risk for impaired skin integrity will decrease Outcome: Progressing   Problem: Tissue Perfusion: Goal: Adequacy of tissue perfusion will improve Outcome: Progressing   Problem: Activity: Goal: Ability to tolerate increased activity will improve Outcome: Progressing   Problem: Clinical Measurements: Goal: Ability to maintain a body temperature in the normal range will improve Outcome: Progressing   Problem: Respiratory: Goal:  Ability to maintain adequate ventilation will improve Outcome: Progressing Goal: Ability to maintain a clear airway will improve Outcome: Progressing   Problem: Education: Goal: Knowledge of General Education information will improve Description: Including pain rating scale, medication(s)/side effects and non-pharmacologic comfort measures Outcome: Progressing   Problem: Health Behavior/Discharge Planning: Goal: Ability to manage health-related needs will improve Outcome: Progressing   Problem: Clinical Measurements: Goal: Ability to maintain clinical measurements within normal limits will improve Outcome: Progressing Goal: Will remain free from infection Outcome: Progressing Goal: Diagnostic test results will improve Outcome: Progressing Goal: Respiratory complications will improve Outcome: Progressing Goal: Cardiovascular complication will be avoided Outcome: Progressing   Problem: Activity: Goal: Risk for activity intolerance will decrease Outcome: Progressing   Problem: Nutrition: Goal: Adequate nutrition will be maintained Outcome: Progressing   Problem: Coping: Goal: Level of anxiety will decrease Outcome: Progressing   Problem: Elimination: Goal: Will not experience complications related to bowel motility Outcome: Progressing Goal: Will not experience complications related to urinary retention Outcome: Progressing   Problem: Pain Managment: Goal: General experience of comfort will improve and/or be controlled Outcome: Progressing   Problem: Safety: Goal: Ability to remain free from injury will improve Outcome: Progressing   Problem: Skin Integrity: Goal: Risk for impaired skin integrity will decrease Outcome: Progressing

## 2024-07-14 NOTE — Progress Notes (Signed)
 Physical Therapy Treatment Patient Details Name: Kendra [name] MRN: [id number] DOB: January 24, 1946 Today's Date: 07/14/2024   History of Present Illness Pt is a 78 yo female who presents 07/08/24 with shortness of breath and cough. Found to have acute respiratory failure due to influenza A, SARS-CoV-2, and multilobular pneumonia.  PHMx: anemia of chronic disease, GERD, depression, anxiety, hypothyroidism, HLD, RLS, HTN, GERD, obesity, HTN,chronic pain, spinal cord stimulator, DDD, and L THR.    PT Comments  Pt seated EOB upon arrival, expressing urgency to go to the bathroom. CGA required for STS and ambulation to toilet, pt uses grab bars to lower self safely to seated position. ModA required for peri care due to pt gown and legs being saturated. Pt demonstrates good static balance with walker while peri care is performed. Pt ambulates 6 bouts of 10' in the room without seated rest break. Pt SOB throughout and desats to 85% on 3L. O2 increased to 4L with cues for deep breathing and improved to 90s%. Pt would benefit from continued PT services focused on strength, balance, and endurance to improve tolerance to functional mobility.    If plan is discharge home, recommend the following: A little help with walking and/or transfers;A little help with bathing/dressing/bathroom;Assistance with cooking/housework;Assist for transportation;Help with stairs or ramp for entrance   Can travel by private vehicle        Equipment Recommendations  Rolling walker (2 wheels);BSC/3in1    Recommendations for Other Services       Precautions / Restrictions Precautions Precautions: Fall Recall of Precautions/Restrictions: Intact Restrictions Weight Bearing Restrictions Per Provider Order: No     Mobility  Bed Mobility               General bed mobility comments: Pt sitting EOB upon arrival.    Transfers Overall transfer level: Needs assistance Equipment used: Rolling walker (2 wheels) Transfers:  Sit to/from Stand Sit to Stand: Contact guard assist, Min assist           General transfer comment: Pt requires increased assist to stand from standard toilet height. Pt does well with STS from bed height.    Ambulation/Gait Ambulation/Gait assistance: Contact guard assist Gait Distance (Feet): 60 Feet (10'x6) Assistive device: Rolling walker (2 wheels) Gait Pattern/deviations: Step-through pattern, Decreased step length - right, Decreased step length - left, Narrow base of support   Gait velocity interpretation: <1.8 ft/sec, indicate of risk for recurrent falls   General Gait Details: Pt tolerates multiple short bouts of ambulation in the room and declines seated rest break between bouts. Pt desats to 85% on 3L while ambulating and improves to 90s on 4L with cues for deep breathing. Pt SOB throughout but denies other symptoms.   Stairs             Wheelchair Mobility     Tilt Bed    Modified Rankin (Stroke Patients Only)       Balance Overall balance assessment: Mild deficits observed, not formally tested Sitting-balance support: No upper extremity supported Sitting balance-Leahy Scale: Good Sitting balance - Comments: No sitting balance concerns   Standing balance support: Bilateral upper extremity supported Standing balance-Leahy Scale: Good Standing balance comment: Pt benefits from rolling walker at this time due to poor activity tolerance and SOB. Good balance noted with turns.                            Communication Communication Communication: No apparent difficulties  Cognition Arousal: Alert Behavior During Therapy: WFL for tasks assessed/performed   PT - Cognitive impairments: No apparent impairments                         Following commands: Intact      Cueing Cueing Techniques: Verbal cues, Tactile cues, Visual cues  Exercises      General Comments General comments (skin integrity, edema, etc.): SpO2 decreased to  85% on 3L with mobility, improved to 90s% on 4L, weaned back down to 3L at end of session. No new skin abnormalities noted.      Pertinent Vitals/Pain Pain Assessment Pain Assessment: No/denies pain    Home Living                          Prior Function            PT Goals (current goals can now be found in the care plan section) Acute Rehab PT Goals Patient Stated Goal: No new goals stated during session    Frequency    Min 2X/week      PT Plan      Co-evaluation              AM-PAC PT 6 Clicks Mobility   Outcome Measure  Help needed turning from your back to your side while in a flat bed without using bedrails?: None Help needed moving from lying on your back to sitting on the side of a flat bed without using bedrails?: None Help needed moving to and from a bed to a chair (including a wheelchair)?: A Little Help needed standing up from a chair using your arms (e.g., wheelchair or bedside chair)?: A Little Help needed to walk in hospital room?: A Little Help needed climbing 3-5 steps with a railing? : A Lot 6 Click Score: 19    End of Session   Activity Tolerance: Patient limited by fatigue (Pt limited by SOB) Patient left: in chair;with call bell/phone within reach;with chair alarm set Nurse Communication: Mobility status PT Visit Diagnosis: Unsteadiness on feet (R26.81);Muscle weakness (generalized) (M62.81)     Time: 8961-8889 PT Time Calculation (min) (ACUTE ONLY): 32 min  Charges:    $Gait Training: 8-22 mins $Therapeutic Activity: 8-22 mins PT General Charges $$ ACUTE PT VISIT: 1 Visit                     Sabra Morel, PT, DPT  Acute Rehabilitation Services         Office: [phone]      Sabra MARLA Morel 07/14/2024, 3:59 PM

## 2024-07-15 ENCOUNTER — Inpatient Hospital Stay (HOSPITAL_COMMUNITY)

## 2024-07-15 DIAGNOSIS — I10 Essential (primary) hypertension: Secondary | ICD-10-CM | POA: Diagnosis not present

## 2024-07-15 DIAGNOSIS — U071 COVID-19: Secondary | ICD-10-CM | POA: Diagnosis not present

## 2024-07-15 DIAGNOSIS — J09X1 Influenza due to identified novel influenza A virus with pneumonia: Secondary | ICD-10-CM | POA: Diagnosis not present

## 2024-07-15 DIAGNOSIS — R0603 Acute respiratory distress: Secondary | ICD-10-CM

## 2024-07-15 DIAGNOSIS — J9601 Acute respiratory failure with hypoxia: Secondary | ICD-10-CM | POA: Diagnosis not present

## 2024-07-15 LAB — ECHOCARDIOGRAM COMPLETE
AR max vel: 1.94 cm2
AV Area VTI: 2.22 cm2
AV Area mean vel: 1.77 cm2
AV Mean grad: 8 mmHg
AV Peak grad: 15.8 mmHg
Ao pk vel: 1.99 m/s
Area-P 1/2: 2.49 cm2
Calc EF: 59.5 %
Height: 66 in
S' Lateral: 3.9 cm
Single Plane A2C EF: 59.5 %
Single Plane A4C EF: 60.6 %
Weight: 3950.64 [oz_av]

## 2024-07-15 LAB — BASIC METABOLIC PANEL WITH GFR
Anion gap: 7 (ref 5–15)
BUN: 14 mg/dL (ref 8–23)
CO2: 42 mmol/L — ABNORMAL HIGH (ref 22–32)
Calcium: 9.1 mg/dL (ref 8.9–10.3)
Chloride: 91 mmol/L — ABNORMAL LOW (ref 98–111)
Creatinine, Ser: 0.48 mg/dL (ref 0.44–1.00)
GFR, Estimated: >60 mL/min
Glucose, Bld: 93 mg/dL (ref 70–99)
Potassium: 3.7 mmol/L (ref 3.5–5.1)
Sodium: 140 mmol/L (ref 135–145)

## 2024-07-15 LAB — GLUCOSE, CAPILLARY
Glucose-Capillary: 92 mg/dL (ref 70–99)
Glucose-Capillary: 102 mg/dL — ABNORMAL HIGH (ref 70–99)
Glucose-Capillary: 176 mg/dL — ABNORMAL HIGH (ref 70–99)
Glucose-Capillary: 126 mg/dL — ABNORMAL HIGH (ref 70–99)

## 2024-07-15 MED ORDER — FUROSEMIDE 10 MG/ML IJ SOLN
40.0000 mg | Freq: Every day | INTRAMUSCULAR | Status: DC
  Filled 2024-07-15: qty 4

## 2024-07-15 MED ORDER — FUROSEMIDE 10 MG/ML IJ SOLN
40.0000 mg | Freq: Two times a day (BID) | INTRAMUSCULAR | Status: DC
  Administered 2024-07-15: 40 mg via INTRAVENOUS
  Filled 2024-07-15: qty 4

## 2024-07-15 MED ORDER — POTASSIUM CHLORIDE CRYS ER 20 MEQ PO TBCR
20.0000 meq | EXTENDED_RELEASE_TABLET | Freq: Once | ORAL | Status: AC
  Administered 2024-07-15: 20 meq via ORAL
  Filled 2024-07-15: qty 1

## 2024-07-15 NOTE — Plan of Care (Signed)
 Pt weaned to 2L oxygen with Sats 90-93%. No acute distress noted or reported. Ambulated with walker to and from BR x3 with 1 assist. Problem: Education: Goal: Knowledge of risk factors and measures for prevention of condition will improve Outcome: Progressing   Problem: Coping: Goal: Psychosocial and spiritual needs will be supported Outcome: Progressing   Problem: Respiratory: Goal: Will maintain a patent airway Outcome: Progressing Goal: Complications related to the disease process, condition or treatment will be avoided or minimized Outcome: Progressing   Problem: Education: Goal: Ability to describe self-care measures that may prevent or decrease complications (Diabetes Survival Skills Education) will improve Outcome: Progressing Goal: Individualized Educational Video(s) Outcome: Progressing   Problem: Coping: Goal: Ability to adjust to condition or change in health will improve Outcome: Progressing   Problem: Fluid Volume: Goal: Ability to maintain a balanced intake and output will improve Outcome: Progressing   Problem: Health Behavior/Discharge Planning: Goal: Ability to identify and utilize available resources and services will improve Outcome: Progressing Goal: Ability to manage health-related needs will improve Outcome: Progressing   Problem: Metabolic: Goal: Ability to maintain appropriate glucose levels will improve Outcome: Progressing   Problem: Nutritional: Goal: Maintenance of adequate nutrition will improve Outcome: Progressing Goal: Progress toward achieving an optimal weight will improve Outcome: Progressing   Problem: Skin Integrity: Goal: Risk for impaired skin integrity will decrease Outcome: Progressing   Problem: Tissue Perfusion: Goal: Adequacy of tissue perfusion will improve Outcome: Progressing   Problem: Activity: Goal: Ability to tolerate increased activity will improve Outcome: Progressing   Problem: Clinical Measurements: Goal:  Ability to maintain a body temperature in the normal range will improve Outcome: Progressing   Problem: Respiratory: Goal: Ability to maintain adequate ventilation will improve Outcome: Progressing Goal: Ability to maintain a clear airway will improve Outcome: Progressing   Problem: Education: Goal: Knowledge of General Education information will improve Description: Including pain rating scale, medication(s)/side effects and non-pharmacologic comfort measures Outcome: Progressing   Problem: Health Behavior/Discharge Planning: Goal: Ability to manage health-related needs will improve Outcome: Progressing   Problem: Clinical Measurements: Goal: Ability to maintain clinical measurements within normal limits will improve Outcome: Progressing Goal: Will remain free from infection Outcome: Progressing Goal: Diagnostic test results will improve Outcome: Progressing Goal: Respiratory complications will improve Outcome: Progressing Goal: Cardiovascular complication will be avoided Outcome: Progressing   Problem: Activity: Goal: Risk for activity intolerance will decrease Outcome: Progressing   Problem: Nutrition: Goal: Adequate nutrition will be maintained Outcome: Progressing   Problem: Coping: Goal: Level of anxiety will decrease Outcome: Progressing   Problem: Elimination: Goal: Will not experience complications related to bowel motility Outcome: Progressing Goal: Will not experience complications related to urinary retention Outcome: Progressing   Problem: Pain Managment: Goal: General experience of comfort will improve and/or be controlled Outcome: Progressing   Problem: Safety: Goal: Ability to remain free from injury will improve Outcome: Progressing   Problem: Skin Integrity: Goal: Risk for impaired skin integrity will decrease Outcome: Progressing

## 2024-07-15 NOTE — Plan of Care (Signed)
" °  Problem: Education: Goal: Knowledge of risk factors and measures for prevention of condition will improve Outcome: Progressing   Problem: Coping: Goal: Psychosocial and spiritual needs will be supported Outcome: Progressing   Problem: Respiratory: Goal: Will maintain a patent airway Outcome: Progressing Goal: Complications related to the disease process, condition or treatment will be avoided or minimized Outcome: Progressing   Problem: Education: Goal: Ability to describe self-care measures that may prevent or decrease complications (Diabetes Survival Skills Education) will improve Outcome: Progressing Goal: Individualized Educational Video(s) Outcome: Progressing   Problem: Coping: Goal: Ability to adjust to condition or change in health will improve Outcome: Progressing   Problem: Fluid Volume: Goal: Ability to maintain a balanced intake and output will improve Outcome: Progressing   Problem: Health Behavior/Discharge Planning: Goal: Ability to identify and utilize available resources and services will improve Outcome: Progressing Goal: Ability to manage health-related needs will improve Outcome: Progressing   Problem: Metabolic: Goal: Ability to maintain appropriate glucose levels will improve Outcome: Progressing   Problem: Nutritional: Goal: Maintenance of adequate nutrition will improve Outcome: Progressing Goal: Progress toward achieving an optimal weight will improve Outcome: Progressing   Problem: Skin Integrity: Goal: Risk for impaired skin integrity will decrease Outcome: Progressing   Problem: Tissue Perfusion: Goal: Adequacy of tissue perfusion will improve Outcome: Progressing   Problem: Activity: Goal: Ability to tolerate increased activity will improve Outcome: Progressing   Problem: Clinical Measurements: Goal: Ability to maintain a body temperature in the normal range will improve Outcome: Progressing   Problem: Respiratory: Goal:  Ability to maintain adequate ventilation will improve Outcome: Progressing Goal: Ability to maintain a clear airway will improve Outcome: Progressing   Problem: Education: Goal: Knowledge of General Education information will improve Description: Including pain rating scale, medication(s)/side effects and non-pharmacologic comfort measures Outcome: Progressing   Problem: Health Behavior/Discharge Planning: Goal: Ability to manage health-related needs will improve Outcome: Progressing   Problem: Clinical Measurements: Goal: Ability to maintain clinical measurements within normal limits will improve Outcome: Progressing Goal: Will remain free from infection Outcome: Progressing Goal: Diagnostic test results will improve Outcome: Progressing Goal: Respiratory complications will improve Outcome: Progressing Goal: Cardiovascular complication will be avoided Outcome: Progressing   Problem: Activity: Goal: Risk for activity intolerance will decrease Outcome: Progressing   Problem: Nutrition: Goal: Adequate nutrition will be maintained Outcome: Progressing   Problem: Coping: Goal: Level of anxiety will decrease Outcome: Progressing   Problem: Elimination: Goal: Will not experience complications related to bowel motility Outcome: Progressing Goal: Will not experience complications related to urinary retention Outcome: Progressing   Problem: Pain Managment: Goal: General experience of comfort will improve and/or be controlled Outcome: Progressing   Problem: Safety: Goal: Ability to remain free from injury will improve Outcome: Progressing   Problem: Skin Integrity: Goal: Risk for impaired skin integrity will decrease Outcome: Progressing   "

## 2024-07-15 NOTE — TOC Progression Note (Signed)
 Transition of Care [facility]) - Progression Note    Patient Details  Name: NATALIYAH PACKHAM MRN: [id number] Date of Birth: 31-Dec-1945  Transition of [facility]) CM/SW Contact  Marval Gell, RN Phone Number: 07/15/2024, 12:41 PM  Clinical Narrative:     Ordered home oxygen to be delivered to the room through [facility]   Expected Discharge Plan: Home w Home Health Services Barriers to Discharge: Continued Medical Work up               Expected Discharge Plan and Services   Discharge Planning Services: CM Consult Post Acute Care Choice: Home Health Living arrangements for the past 2 months: Single Family Home                 DME Arranged: Oxygen DME Agency: [employer] Date DME Agency Contacted: 07/15/24 Time DME Agency Contacted: 1240 Representative spoke with at DME Agency: [place] HH Arranged: PT HH Agency: [facility] Date [facility] Agency Contacted: 07/11/24 Time HH Agency Contacted: 1048 Representative spoke with at [facility] Agency: [place]   Social Drivers of Health (SDOH) Interventions SDOH Screenings   Food Insecurity: No Food Insecurity (07/10/2024)  Housing: Low Risk (07/10/2024)  Transportation Needs: No Transportation Needs (07/10/2024)  Utilities: Not At Risk (07/10/2024)  Social Connections: Moderately Integrated (07/10/2024)  Tobacco Use: Medium Risk (07/10/2024)    Readmission Risk Interventions     No data to display

## 2024-07-15 NOTE — Progress Notes (Signed)
 Patient oxygen saturation dropped 87% when resting on room air. Patient oxygen saturation returned to 92% on 2 L nasal cannula. When ambulating, patient dropped to 84% on 2 liters nasal cannula. Ambulating on 4 [place], patient's oxygen saturation was at 93%

## 2024-07-15 NOTE — Progress Notes (Signed)
 "                        PROGRESS NOTE        PATIENT DETAILS Name: Kendra [name] Age: 78 y.o. Sex: female Date of Birth: 1945-11-22 Admit Date: 07/09/2024 Admitting Physician Adriana DELENA Grams, MD ERE:[id number], [name], NP  Brief Summary: Patient is a 78 y.o.  female with history of HTN, HLD, hypothyroidism, depression/anxiety, OSA who presented with shortness of breath-found to have acute hypoxic respiratory failure secondary to COVID/influenza pneumonia.  Significant events: 12/15>> admit to [facility]  Significant studies: 12/15>> CTA chest: No PE-patchy infiltrates-multifocal PNA.-  Significant microbiology data: 12/15>> COVID PCR/influenza A: Positive 12/15>> influenza B/RSV PCR: Negative 12/15>> blood cultures: No growth  Procedures: None  Consults: None  Subjective: Down to 2 L of oxygen at rest-overall feels better.  Anxious to go home.  Objective: Vitals: Blood pressure (!) 142/87, pulse 74, temperature 98.2 F (36.8 C), temperature source Oral, resp. rate (!) 25, height 5' 6 (1.676 m), weight 112 kg, SpO2 (!) 89%.   Exam: Gen Exam:Alert awake-not in any distress HEENT:atraumatic, normocephalic Chest: B/L clear to auscultation anteriorly CVS:S1S2 regular Abdomen:soft non tender, non distended Extremities:no edema Neurology: Non focal Skin: no rash  Pertinent Labs/Radiology:    Latest Ref Rng & Units 07/14/2024    7:42 AM 07/12/2024    9:42 AM 07/11/2024    4:33 AM  CBC  WBC 4.0 - 10.5 K/uL 5.6  8.5  8.0   Hemoglobin 12.0 - 15.0 g/dL 9.1  8.5  8.0   Hematocrit 36.0 - 46.0 % 32.2  30.7  28.1   Platelets 150 - 400 K/uL 263  269  270     Lab Results  Component Value Date   NA 140 07/15/2024   K 3.7 07/15/2024   CL 91 (L) 07/15/2024   CO2 42 (H) 07/15/2024      Assessment/Plan: Acute hypoxic respiratory failure secondary to multifocal COVID/influenza A PNA Much improved with Tamiflu /IV antibiotics/steroids/diuretic regimen Down to 2  L of oxygen at rest-requires up to 4-5 with ambulation Continue tapering prednisone  Has completed a course of Tamiflu /antibiotics-all discontinued 12/20 Continue diuretics to keep in negative balance although no significant signs of volume overload this morning Home ambulatory O2 screen-suspect will require home O2 on discharge If does well-likely home 12/22  Acute on chronic HFpEF Precipitated by infection Volume status has stabilized Continue diuretics-keeping negative balance  Hypokalemia Repleted  Multifactorial anemia Secondary to anemia of acute illness/chronic disease/iron  deficiency 1 unit of PRBC on 12/16-Hb stable since then Follow periodically  Hypothyroidism Synthroid   Anxiety/depression Continue Prozac /clonazepam  As needed Xanax   RLS Pramipexole   GERD/hiatal hernia PPI  OSA CPAP nightly but refused last night as she wears a nasal piece at home  Class 3 Obesity: Estimated body mass index is 39.85 kg/m as calculated from the following:   Height as of this encounter: 5' 6 (1.676 m).   Weight as of this encounter: 112 kg.   Code status:   Code Status: Full Code   DVT Prophylaxis: heparin  injection 5,000 Units Start: 07/09/24 1500 SCDs Start: 07/09/24 1448    Family Communication: None at bedside   Disposition Plan: Status is: Inpatient Remains inpatient appropriate because: Severity of illness   Planned Discharge Destination:Home health   Diet: Diet Order             Diet regular Room service appropriate? Yes; Fluid consistency: Thin  Diet effective now  Antimicrobial agents: Anti-infectives (From admission, onward)    Start     Dose/Rate Route Frequency Ordered Stop   07/11/24 1245  azithromycin  (ZITHROMAX ) tablet 500 mg        500 mg Oral Daily 07/11/24 1158 07/14/24 0830   07/10/24 1400  azithromycin  (ZITHROMAX ) 500 mg in dextrose  5 % 250 mL IVPB  Status:  Discontinued        500 mg 255 mL/hr over 60  Minutes Intravenous Every 24 hours 07/09/24 1516 07/11/24 1158   07/10/24 1000  remdesivir  100 mg in sodium chloride  0.9 % 100 mL IVPB       Placed in Followed by Linked Group   100 mg 200 mL/hr over 30 Minutes Intravenous Daily 07/09/24 1518 07/11/24 1054   07/09/24 1530  cefTRIAXone  (ROCEPHIN ) 1 g in sodium chloride  0.9 % 100 mL IVPB  Status:  Discontinued        1 g 200 mL/hr over 30 Minutes Intravenous Every 24 hours 07/09/24 1516 07/14/24 0939   07/09/24 1515  levofloxacin  (LEVAQUIN ) IVPB 750 mg  Status:  Discontinued        750 mg 100 mL/hr over 90 Minutes Intravenous Every 24 hours 07/09/24 1501 07/09/24 1516   07/09/24 1515  remdesivir  200 mg in sodium chloride  0.9% 250 mL IVPB       Placed in Followed by Linked Group   200 mg 580 mL/hr over 30 Minutes Intravenous Once 07/09/24 1518 07/09/24 2058   07/09/24 1500  oseltamivir  (TAMIFLU ) capsule 75 mg  Status:  Discontinued        75 mg Oral 2 times daily 07/09/24 1447 07/14/24 1544   07/09/24 1445  oseltamivir  (TAMIFLU ) capsule 75 mg        75 mg Oral  Once 07/09/24 1431 07/09/24 1438   07/09/24 1015  levofloxacin  (LEVAQUIN ) IVPB 750 mg        750 mg 100 mL/hr over 90 Minutes Intravenous  Once 07/09/24 1013 07/09/24 1312        MEDICATIONS: Scheduled Meds:  acetaminophen   1,000 mg Oral Q8H   Or   acetaminophen   650 mg Rectal Q8H   buPROPion   150 mg Oral Daily   clonazePAM   1 mg Oral QHS   FLUoxetine   20 mg Oral Daily   furosemide   40 mg Intravenous Q12H   guaiFENesin   1,200 mg Oral BID   heparin   5,000 Units Subcutaneous Q8H   insulin  aspart  0-6 Units Subcutaneous TID WC   levothyroxine   88 mcg Oral QAC breakfast   pantoprazole   40 mg Oral Daily   pramipexole   2 mg Oral TID   predniSONE   40 mg Oral Q breakfast   sodium chloride  flush  3 mL Intravenous Q12H   sodium chloride  flush  3 mL Intravenous Q12H   zinc  sulfate (50mg  elemental zinc )  220 mg Oral Daily   Continuous Infusions:   PRN Meds:.ALPRAZolam ,  bisacodyl , hydrALAZINE , ipratropium, ondansetron  **OR** ondansetron  (ZOFRAN ) IV, oxyCODONE , senna-docusate, sodium chloride , sodium phosphate , SUMAtriptan , traZODone    I have personally reviewed following labs and imaging studies  LABORATORY DATA: CBC: Recent Labs  Lab 07/09/24 1122 07/09/24 1135 07/09/24 1136 07/10/24 0515 07/11/24 0433 07/12/24 0942 07/14/24 0742  WBC 8.1  --   --  5.6 8.0 8.5 5.6  NEUTROABS 6.5  --   --   --   --   --   --   HGB 7.9*   < > 9.9* 6.9* 8.0* 8.5* 9.1*  HCT 28.2*   < >  29.0* 24.9* 28.1* 30.7* 32.2*  MCV 70.9*  --   --  72.6* 74.1* 74.3* 73.5*  PLT 330  --   --  285 270 269 263   < > = values in this interval not displayed.    Basic Metabolic Panel: Recent Labs  Lab 07/09/24 2128 07/10/24 0515 07/11/24 0433 07/12/24 0942 07/13/24 0547 07/14/24 0742 07/15/24 0345  NA  --    < > 139 141 140 143 140  K  --    < > 4.2 3.6 3.8 3.3* 3.7  CL  --    < > 100 95* 95* 92* 91*  CO2  --    < > 33* 39* 36* 45* 42*  GLUCOSE  --    < > 153* 100* 80 95 93  BUN  --    < > 14 13 13 11 14   CREATININE  --    < > 0.52 0.49 0.44 0.45 0.48  CALCIUM   --    < > 8.9 9.1 8.7* 9.0 9.1  MG 1.8  --   --   --   --   --   --   PHOS 4.0  --   --   --   --   --   --    < > = values in this interval not displayed.    GFR: Estimated Creatinine Clearance: 75.9 mL/min (by C-G formula based on SCr of 0.48 mg/dL).  Liver Function Tests: Recent Labs  Lab 07/09/24 1122  AST 25  ALT 16  ALKPHOS 81  BILITOT 0.3  PROT 6.5  ALBUMIN  3.6   No results for input(s): LIPASE, AMYLASE in the last 168 hours. No results for input(s): AMMONIA in the last 168 hours.  Coagulation Profile: Recent Labs  Lab 07/09/24 1122  INR 1.2    Cardiac Enzymes: No results for input(s): CKTOTAL, CKMB, CKMBINDEX, TROPONINI in the last 168 hours.  BNP (last 3 results) No results for input(s): PROBNP in the last 8760 hours.  Lipid Profile: No results for input(s):  CHOL, HDL, LDLCALC, TRIG, CHOLHDL, LDLDIRECT in the last 72 hours.  Thyroid  Function Tests: No results for input(s): TSH, T4TOTAL, FREET4, T3FREE, THYROIDAB in the last 72 hours.  Anemia Panel: No results for input(s): VITAMINB12, FOLATE, FERRITIN, TIBC, IRON , RETICCTPCT in the last 72 hours.  Urine analysis:    Component Value Date/Time   COLORURINE STRAW (A) 12/08/2020 1108   APPEARANCEUR CLEAR 12/08/2020 1108   LABSPEC 1.006 12/08/2020 1108   PHURINE 7.0 12/08/2020 1108   GLUCOSEU NEGATIVE 12/08/2020 1108   HGBUR NEGATIVE 12/08/2020 1108   BILIRUBINUR NEGATIVE 12/08/2020 1108   KETONESUR NEGATIVE 12/08/2020 1108   PROTEINUR NEGATIVE 12/08/2020 1108   NITRITE NEGATIVE 12/08/2020 1108   LEUKOCYTESUR NEGATIVE 12/08/2020 1108    Sepsis Labs: Lactic Acid, Venous    Component Value Date/Time   LATICACIDVEN 0.6 07/09/2024 1136    MICROBIOLOGY: Recent Results (from the past 240 hours)  Resp panel by RT-PCR (RSV, Flu A&B, Covid) Peripheral     Status: Abnormal   Collection Time: 07/09/24 10:11 AM   Specimen: Peripheral; Nasal Swab  Result Value Ref Range Status   SARS Coronavirus 2 by RT PCR POSITIVE (A) NEGATIVE Final   Influenza A by PCR POSITIVE (A) NEGATIVE Final   Influenza B by PCR NEGATIVE NEGATIVE Final    Comment: (NOTE) The Xpert Xpress SARS-CoV-2/FLU/RSV plus assay is intended as an aid in the diagnosis of influenza from Nasopharyngeal swab specimens and should  not be used as a sole basis for treatment. Nasal washings and aspirates are unacceptable for Xpert Xpress SARS-CoV-2/FLU/RSV testing.  Fact Sheet for Patients: bloggercourse.com  Fact Sheet for Healthcare Providers: seriousbroker.it  This test is not yet approved or cleared by the [place]  FDA and has been authorized for detection and/or diagnosis of SARS-CoV-2 by FDA under an Emergency Use Authorization (EUA).  This EUA will remain in effect (meaning this test can be used) for the duration of the COVID-19 declaration under Section 564(b)(1) of the Act, 21 U.S.C. section 360bbb-3(b)(1), unless the authorization is terminated or revoked.     Resp Syncytial Virus by PCR NEGATIVE NEGATIVE Final    Comment: (NOTE) Fact Sheet for Patients: bloggercourse.com  Fact Sheet for Healthcare Providers: seriousbroker.it  This test is not yet approved or cleared by the [place]  FDA and has been authorized for detection and/or diagnosis of SARS-CoV-2 by FDA under an Emergency Use Authorization (EUA). This EUA will remain in effect (meaning this test can be used) for the duration of the COVID-19 declaration under Section 564(b)(1) of the Act, 21 U.S.C. section 360bbb-3(b)(1), unless the authorization is terminated or revoked.  Performed at [facility], [address] [place], [place] [address]   Blood Culture (routine x 2)     Status: None   Collection Time: 07/09/24 10:11 AM   Specimen: BLOOD  Result Value Ref Range Status   Specimen Description BLOOD SITE NOT SPECIFIED  Final   Special Requests   Final    BOTTLES DRAWN AEROBIC AND ANAEROBIC Blood Culture results may not be optimal due to an inadequate volume of blood received in culture bottles   Culture   Final    NO GROWTH 5 DAYS Performed at [facility], [address], [place], [place] [address]    Report Status 07/14/2024 FINAL  Final  Blood Culture (routine x 2)     Status: None   Collection Time: 07/09/24 11:15 AM   Specimen: BLOOD  Result Value Ref Range Status   Specimen Description BLOOD SITE NOT SPECIFIED  Final   Special Requests   Final    BOTTLES DRAWN AEROBIC AND ANAEROBIC Blood Culture results may not be optimal due to an inadequate volume of blood received in culture bottles   Culture   Final    NO GROWTH 5 DAYS Performed at [facility], [address] [place], [place] [address]    Report Status 07/14/2024 FINAL  Final    RADIOLOGY STUDIES/RESULTS: DG CHEST PORT 1 VIEW Result Date: 07/14/2024 EXAM: 1 VIEW(S) XRAY OF THE CHEST 07/14/2024 07:15:00 AM COMPARISON: 07/12/2024 CLINICAL HISTORY: Acute on chronic respiratory failure with hypoxia (HCC) [id number] FINDINGS: LINES, TUBES AND DEVICES: Spinal stimulator noted. LUNGS AND PLEURA: Central pulmonary vascular congestion again noted. Blunting of the right costophrenic sulcus, possibly reflecting small pleural effusion. No focal pulmonary opacity. No pneumothorax. HEART AND MEDIASTINUM: Stable cardiomegaly. BONES AND SOFT TISSUES: No acute osseous abnormality. IMPRESSION: 1. Redemonstrated cardiomegaly with unchanged central pulmonary vascular congestion. 2. Blunting of the right costophrenic sulcus, possibly reflecting a small pleural effusion. Electronically signed by: Rogelia Myers MD 07/14/2024 10:59 AM EST RP Workstation: [id number]     LOS: 5 days   Donalda Applebaum, MD  [facility]    To contact the attending provider between 7A-7P or the covering provider during after hours 7P-7A, please log into the web site [email or web address] and access using universal [place] password for that web site. If  you do not have the password, please call the hospital operator.  07/15/2024, 10:29 AM    "

## 2024-07-15 NOTE — Progress Notes (Signed)
 Occupational Therapy Treatment Patient Details Name: Kendra [name] MRN: [id number] DOB: 10/01/1945 Today's Date: 07/15/2024   History of present illness Pt is a 78 yo female who presents 07/08/24 with shortness of breath and cough. Found to have acute respiratory failure due to influenza A, SARS-CoV-2, and multilobular pneumonia.  PHMx: anemia of chronic disease, GERD, depression, anxiety, hypothyroidism, HLD, RLS, HTN, GERD, obesity, HTN,chronic pain, spinal cord stimulator, DDD, and L THR.   OT comments  Pt progressing well towards OT goals. Focus of session on energy conservation education and progressing independence with functional mobility. Pt provided with [facility] handout and verbalized understanding of strategies. OT addressed Pt concerns related to ADL and IADL tasks in home environment. Pt repositioned in recliner with supervision. Remained on 3L O2 with SpO2 dropping to 87% but improvement >90% with PLB education and time. OT to continue to follow Pt acutely, continue per POC.        If plan is discharge home, recommend the following:  A little help with walking and/or transfers;A little help with bathing/dressing/bathroom;Assistance with cooking/housework;Assist for transportation;Help with stairs or ramp for entrance   Equipment Recommendations  None recommended by OT    Recommendations for Other Services      Precautions / Restrictions Precautions Precautions: Fall Recall of Precautions/Restrictions: Intact Precaution/Restrictions Comments: watch SpO2 (does not wear baseline) Restrictions Weight Bearing Restrictions Per Provider Order: No       Mobility Bed Mobility               General bed mobility comments: Pt greeted in recliner and returned to recliner    Transfers Overall transfer level: Needs assistance Equipment used: None               General transfer comment: Pt required assistance to reposition in chair and reposition chair location. Pt able  to reposition in chair with supervision, able to partially squat off of chair with supervision to achieve desired position     Balance Overall balance assessment: No apparent balance deficits (not formally assessed)                                         ADL either performed or assessed with clinical judgement   ADL Overall ADL's : Needs assistance/impaired     Grooming: Set up;Sitting                                      Extremity/Trunk Assessment Upper Extremity Assessment Upper Extremity Assessment: Overall WFL for tasks assessed            Vision       Perception     Praxis     Communication Communication Communication: No apparent difficulties   Cognition Arousal: Alert Behavior During Therapy: WFL for tasks assessed/performed Cognition: No apparent impairments                               Following commands: Intact        Cueing   Cueing Techniques: Verbal cues  Exercises      Shoulder Instructions       General Comments Pt seen this session for energy conservation management. Educated Pt on strategies for [facility] for safe engagement in ADL tasks at home.  Pt concerned regarding pet care, OT provided education on task simplification and fall hazards pertaining to pets. Pt verbalized understanding of strategies. SpO2 as low as 87% throughout session with 3L [place]. Educated Pt on PLB technqiue with improved SpO2 >90% while sitting. Noted decrease in SpO2 during mobility but able to improve within seconds as Pt engaged in deep breathing.    Pertinent Vitals/ Pain       Pain Assessment Pain Assessment: No/denies pain  Home Living                                          Prior Functioning/Environment              Frequency  Min 2X/week        Progress Toward Goals  OT Goals(current goals can now be found in the care plan section)  Progress towards OT goals: Progressing toward  goals  Acute Rehab OT Goals Patient Stated Goal: to get better OT Goal Formulation: With patient Time For Goal Achievement: 07/24/24 Potential to Achieve Goals: Good ADL Goals Pt Will Transfer to Toilet: Independently;ambulating Pt Will Perform Toileting - Clothing Manipulation and hygiene: Independently;sit to/from stand Additional ADL Goal #1: Pt will be aware of energy conservation techniques from 5 P's of EC that will be of benefit to her at home.  Plan      Co-evaluation                 AM-PAC OT 6 Clicks Daily Activity     Outcome Measure   Help from another person eating meals?: None Help from another person taking care of personal grooming?: A Little Help from another person toileting, which includes using toliet, bedpan, or urinal?: A Little Help from another person bathing (including washing, rinsing, drying)?: A Little Help from another person to put on and taking off regular upper body clothing?: A Little Help from another person to put on and taking off regular lower body clothing?: A Little 6 Click Score: 19    End of Session Equipment Utilized During Treatment: Oxygen  OT Visit Diagnosis: Unsteadiness on feet (R26.81);Other abnormalities of gait and mobility (R26.89);Muscle weakness (generalized) (M62.81)   Activity Tolerance Patient tolerated treatment well   Patient Left in chair;with call bell/phone within reach   Nurse Communication Mobility status        Time: 8367-8346 OT Time Calculation (min): 21 min  Charges: OT General Charges $OT Visit: 1 Visit OT Treatments $Self Care/Home Management : 8-22 mins  Maurilio CROME, OTR/L.  [facility] Acute Rehabilitation  Office: [phone]   Maurilio PARAS Anam Bobby 07/15/2024, 5:04 PM

## 2024-07-16 ENCOUNTER — Other Ambulatory Visit (HOSPITAL_COMMUNITY): Payer: Self-pay

## 2024-07-16 DIAGNOSIS — J9601 Acute respiratory failure with hypoxia: Secondary | ICD-10-CM | POA: Diagnosis not present

## 2024-07-16 DIAGNOSIS — I10 Essential (primary) hypertension: Secondary | ICD-10-CM | POA: Diagnosis not present

## 2024-07-16 DIAGNOSIS — J09X1 Influenza due to identified novel influenza A virus with pneumonia: Secondary | ICD-10-CM | POA: Diagnosis not present

## 2024-07-16 DIAGNOSIS — U071 COVID-19: Secondary | ICD-10-CM | POA: Diagnosis not present

## 2024-07-16 LAB — BASIC METABOLIC PANEL WITH GFR
Anion gap: 7 (ref 5–15)
BUN: 17 mg/dL (ref 8–23)
CO2: 42 mmol/L — ABNORMAL HIGH (ref 22–32)
Calcium: 9.2 mg/dL (ref 8.9–10.3)
Chloride: 94 mmol/L — ABNORMAL LOW (ref 98–111)
Creatinine, Ser: 0.51 mg/dL (ref 0.44–1.00)
GFR, Estimated: >60 mL/min
Glucose, Bld: 87 mg/dL (ref 70–99)
Potassium: 3.7 mmol/L (ref 3.5–5.1)
Sodium: 142 mmol/L (ref 135–145)

## 2024-07-16 LAB — GLUCOSE, CAPILLARY: Glucose-Capillary: 87 mg/dL (ref 70–99)

## 2024-07-16 MED ORDER — PREDNISONE 20 MG PO TABS
40.0000 mg | ORAL_TABLET | Freq: Every day | ORAL | 0 refills | Status: AC
  Filled 2024-07-16: qty 6, 3d supply, fill #0

## 2024-07-16 MED ORDER — ALBUTEROL SULFATE HFA 108 (90 BASE) MCG/ACT IN AERS
2.0000 | INHALATION_SPRAY | RESPIRATORY_TRACT | 0 refills | Status: AC | PRN
  Filled 2024-07-16: qty 6.7, 17d supply, fill #0

## 2024-07-16 MED ORDER — FUROSEMIDE 40 MG PO TABS
40.0000 mg | ORAL_TABLET | Freq: Every day | ORAL | 1 refills | Status: AC | PRN
  Filled 2024-07-16: qty 30, 30d supply, fill #0

## 2024-07-16 NOTE — TOC Transition Note (Signed)
 Transition of Care [facility]) - Discharge Note   Patient Details  Name: Kendra [name] MRN: [id number] Date of Birth: [birth year]  [facility]) CM/SW Contact:  Kendra DELENA Senters, RN Phone Number: 07/16/2024, 10:12 AM   Clinical Narrative:    Patient will be discharging home with boyfriend providing transportation home.   HH arranged through [facility] for therapy, info on AVS.  DME-rolling walker and BSC rec from therapy, patient refused both items.  No further needs identified by CM.   Final next level of care: Home w Home Health Services Barriers to Discharge: No Barriers Identified   Patient Goals and CMS Choice Patient states their goals for this hospitalization and ongoing recovery are:: to go home CMS Medicare.gov Compare Post Acute Care list provided to:: Patient Choice offered to / list presented to : Patient      Discharge Placement                       Discharge Plan and Services Additional resources added to the After Visit Summary for     Discharge Planning Services: CM Consult Post Acute Care Choice: Home Health          DME Arranged: Oxygen DME Agency: [employer] Date DME Agency Contacted: 07/15/24 Time DME Agency Contacted: [phone] Representative spoke with at DME Agency: [place] HH Arranged: PT, OT HH Agency: [facility] Date [facility] Agency Contacted: 07/11/24 Time HH Agency Contacted: [phone] Representative spoke with at [facility] Agency: [place]  Social Drivers of Health (SDOH) Interventions SDOH Screenings   Food Insecurity: No Food Insecurity (07/10/2024)  Housing: Low Risk (07/10/2024)  Transportation Needs: No Transportation Needs (07/10/2024)  Utilities: Not At Risk (07/10/2024)  Social Connections: Moderately Integrated (07/10/2024)  Tobacco Use: Medium Risk (07/10/2024)     Readmission Risk Interventions     No data to display

## 2024-07-16 NOTE — Discharge Summary (Signed)
 "  PATIENT DETAILS Name: Kendra [name] Age: 78 y.o. Sex: female Date of Birth: April 03, 1946 MRN: [id number]. Admitting Physician: Adriana DELENA Grams, MD ERE:[id number], [name], NP  Admit Date: 07/09/2024 Discharge date: 07/16/2024  Recommendations for Outpatient Follow-up:  Follow up with PCP in 1-2 weeks Please obtain CMP/CBC in one week Please repeat two-view chest x-ray in 4 to 6 weeks Please reassess whether home O2 is still required Please ensure follow-up with pulmonology  Admitted From:  Home  Disposition: Home health   Discharge Condition: good  CODE STATUS:   Code Status: Full Code   Diet recommendation:  Diet Order             Diet regular Room service appropriate? Yes; Fluid consistency: Thin  Diet effective now                    Brief Summary: Patient is a 78 y.o.  female with history of HTN, HLD, hypothyroidism, depression/anxiety, OSA who presented with shortness of breath-found to have acute hypoxic respiratory failure secondary to COVID/influenza pneumonia.   Significant events: 12/15>> admit to [facility]   Significant studies: 12/15>> CTA chest: No PE-patchy infiltrates-multifocal PNA.-   Significant microbiology data: 12/15>> COVID PCR/influenza A: Positive 12/15>> influenza B/RSV PCR: Negative 12/15>> blood cultures: No growth   Procedures: None   Consults: None  Brief Hospital Course: Acute hypoxic respiratory failure secondary to multifocal COVID/influenza A PNA Much improved with Tamiflu /IV antibiotics/steroids/diuretic regimen Down to either 2 L or room air at rest-but requires around 4-5 L with ambulation. Wanting to go home for the past several days-Home O2 has been arranged Plan is to continue tapering prednisone  for a few more days Has completed a course of Tamiflu /antibiotics-all discontinued 12/20 Was given diuretics to ensure negative balance-volume status is stable today Home oxygen arranged Will be discharged  home on 12/22-needs repeat chest x-ray in 4 to 6 weeks to ensure resolution of infiltrates-also needs PCP to reassess oxygen needs at follow-up.  Will place ambulatory referral to pulmonology as well.    Acute on chronic HFpEF Precipitated by infection Volume status has stabilized Volume status has stabilized-as needed diuretics on discharge based on edema/weight gain.   Hypokalemia Repleted   Multifactorial anemia Secondary to anemia of acute illness/chronic disease/iron  deficiency 1 unit of PRBC on 12/16-Hb stable since then Follow periodically   Hypothyroidism Synthroid    Anxiety/depression Not interested in continuing Wellbutrin  (was not taking it prior to this hospitalization) Continue fluoxetine  and as needed Xanax  (per patient-no longer taking Klonopin  prior to this hospitalization)   RLS Pramipexole    GERD/hiatal hernia PPI   OSA CPAP nightly but refused last night as she wears a nasal piece at home   Class 3 Obesity: Body mass index is 38.43 kg/m.   Discharge Diagnoses:  Principal Problem:   Acute hypoxic respiratory failure (HCC) Active Problems:   Depression   Benign essential hypertension   Iron  deficiency anemia   Influenza A with pneumonia   COVID-19 virus infection   GERD without esophagitis   Mixed hyperlipidemia   Restless leg syndrome   Pre-diabetes   Morbid obesity with BMI of 40.0-44.9, adult [facility])   Discharge Instructions:  Activity:  As tolerated with Full fall precautions use walker/cane & assistance as needed  Discharge Instructions     Call MD for:  difficulty breathing, headache or visual disturbances   Complete by: As directed    Discharge instructions   Complete by: As directed    Follow  with Primary MD  [name], NP in 1-2 weeks  Follow-up with pulmonology-their office will give you a call, if you do not hear from them-please give them a call  Please get a complete blood count and chemistry panel checked  by your Primary MD at your next visit, and again as instructed by your Primary MD.  Get Medicines reviewed and adjusted: Please take all your medications with you for your next visit with your Primary MD  Laboratory/radiological data: Please request your Primary MD to go over all hospital tests and procedure/radiological results at the follow up, please ask your Primary MD to get all Hospital records sent to his/her office.  In some cases, they will be blood work, cultures and biopsy results pending at the time of your discharge. Please request that your primary care M.D. follows up on these results.  Also Note the following: If you experience worsening of your admission symptoms, develop shortness of breath, life threatening emergency, suicidal or homicidal thoughts you must seek medical attention immediately by calling 911 or calling your MD immediately  if symptoms less severe.  You must read complete instructions/literature along with all the possible adverse reactions/side effects for all the Medicines you take and that have been prescribed to you. Take any new Medicines after you have completely understood and accpet all the possible adverse reactions/side effects.   Do not drive when taking Pain medications or sleeping medications (Benzodaizepines)  Do not take more than prescribed Pain, Sleep and Anxiety Medications. It is not advisable to combine anxiety,sleep and pain medications without talking with your primary care practitioner  Special Instructions: If you have smoked or chewed Tobacco  in the last 2 yrs please stop smoking, stop any regular Alcohol  and or any Recreational drug use.  Wear Seat belts while driving.  Please note: You were cared for by a hospitalist during your hospital stay. Once you are discharged, your primary care physician will handle any further medical issues. Please note that NO REFILLS for any discharge medications will be authorized once you are  discharged, as it is imperative that you return to your primary care physician (or establish a relationship with a primary care physician if you do not have one) for your post hospital discharge needs so that they can reassess your need for medications and monitor your lab values.   Increase activity slowly   Complete by: As directed    Pulmonary Visit   Complete by: As directed    Reason for referral: Other Pulmonary      Allergies as of 07/16/2024       Reactions   Bee Venom Anaphylaxis   Feldene [piroxicam] Other (See Comments)   unknown   Naphazoline Other (See Comments)   Other Reaction(s): Not available   Cephalosporins Rash   Flexeril [cyclobenzaprine] Nausea Only   Keflex [cephalexin] Rash        Medication List     STOP taking these medications    clonazePAM  0.5 MG tablet Commonly known as: KLONOPIN    doxycycline  100 MG capsule Commonly known as: VIBRAMYCIN        TAKE these medications    pramipexole  1 MG tablet Commonly known as: MIRAPEX  Take 2 mg by mouth 3 (three) times daily. The timing of this medication is very important.   albuterol  108 (90 Base) MCG/ACT inhaler Commonly known as: VENTOLIN  HFA Inhale 2 puffs into the lungs every 4 (four) hours as needed for wheezing or shortness of breath.  ALPRAZolam  0.5 MG tablet Commonly known as: XANAX  Take 0.5 mg by mouth 2 (two) times daily as needed for anxiety.   [employer] Use with the albuterol  inhaler   D3 Maximum Strength 125 MCG (5000 UT) capsule Generic drug: Cholecalciferol Take 5,000 Units by mouth daily.   EPINEPHrine  0.3 mg/0.3 mL Soaj injection Commonly known as: EPI-PEN Inject 0.3 mg into the muscle as needed for anaphylaxis.   FLUoxetine  20 MG capsule Commonly known as: PROZAC  Take 20 mg by mouth daily.   furosemide  40 MG tablet Commonly known as: LASIX  Take 1 tablet (40 mg total) by mouth daily as needed for edema. Or 2 lb weight gain What changed: additional  instructions   latanoprost  0.005 % ophthalmic solution Commonly known as: XALATAN  Place 1 drop into both eyes at bedtime.   levothyroxine  88 MCG tablet Commonly known as: SYNTHROID  Take 88 mcg by mouth daily before breakfast.   oxyCODONE  5 MG immediate release tablet Commonly known as: Oxy IR/ROXICODONE  Take 1-2 tablets (5-10 mg total) by mouth every 4 (four) hours as needed for moderate pain (pain score 4-6).   pantoprazole  40 MG tablet Commonly known as: PROTONIX  Take 1 tablet (40 mg total) by mouth 2 (two) times daily.   predniSONE  20 MG tablet Commonly known as: DELTASONE  Take 2 tablets (40 mg total) by mouth daily with breakfast for 3 days.   promethazine -dextromethorphan  6.25-15 MG/5ML syrup Commonly known as: PROMETHAZINE -DM Take 5 mLs by mouth 4 (four) times daily as needed for cough. Do not use and drive - May make drowsy.   sucralfate  1 g tablet Commonly known as: CARAFATE  Take 1 g by mouth 4 (four) times daily.   SUMAtriptan  100 MG tablet Commonly known as: IMITREX  Take 100 mg by mouth every 2 (two) hours as needed for migraine. May repeat in 2 hours if headache persists or recurs.               Durable Medical Equipment  (From admission, onward)           Start     Ordered   07/15/24 1032  For home use only DME oxygen  Once       Comments: 2 L at rest 4-5 L with ambulation  Question Answer Comment  Length of Need 6 Months   Mode or (Route) Nasal cannula   Liters per Minute 2   Oxygen delivery system: Gas   Oxygen delivery system: Concentrator   Oxygen delivery system: Portable concentrator (POC)   Oxygen delivery system: Gas/Tank      07/15/24 1032            Contact information for after-discharge care     Home Medical Care     [facility]) .   Service: Home Health Services Contact information: [address] [place]  [address] [phone]                     Allergies[1]   Other Procedures/Studies: ECHOCARDIOGRAM COMPLETE Result Date: 07/15/2024    ECHOCARDIOGRAM REPORT   Patient Name:   Kendra [name] Date of Exam: 07/15/2024 Medical Rec #:  [id number]      Height:       66.0 in Accession #:    [id number]     Weight:       246.9 lb Date of Birth:  1946-01-31      BSA:  2.187 m Patient Age:    78 years       BP:           138/89 mmHg Patient Gender: F              HR:           56 bpm. Exam Location:  Inpatient Procedure: 2D Echo, Cardiac Doppler and Color Doppler (Both Spectral and Color            Flow Doppler were utilized during procedure). Indications:    Acute Respiratory Distres R06.03  History:        Patient has prior history of Echocardiogram examinations, most                 recent 07/14/2021. Risk Factors:Hypertension.  Sonographer:    Nathanel Devonshire Referring Phys: LEOMA DONALDA M Lomax Poehler IMPRESSIONS  1. Left ventricular ejection fraction, by estimation, is 55 to 60%. The left ventricle has normal function. The left ventricle has no regional wall motion abnormalities. There is mild concentric left ventricular hypertrophy. Left ventricular diastolic parameters are indeterminate.  2. Right ventricular systolic function is normal. The right ventricular size is normal.  3. Left atrial size was mildly dilated.  4. Small mobile mass on the posterior mitral leaflet consistent with degenerative calc. The mitral valve is degenerative. Trivial mitral valve regurgitation. No evidence of mitral stenosis.  5. The aortic valve is normal in structure. Aortic valve regurgitation is mild. Aortic valve sclerosis/calcification is present, without any evidence of aortic stenosis.  6. The inferior vena cava is normal in size with greater than 50% respiratory variability, suggesting right atrial pressure of 3 mmHg. FINDINGS  Left Ventricle: Left ventricular ejection fraction, by estimation, is 55 to 60%. The left ventricle has normal function. The left ventricle has  no regional wall motion abnormalities. The left ventricular internal cavity size was normal in size. There is  mild concentric left ventricular hypertrophy. Left ventricular diastolic parameters are indeterminate. Right Ventricle: The right ventricular size is normal. No increase in right ventricular wall thickness. Right ventricular systolic function is normal. Left Atrium: Left atrial size was mildly dilated. Right Atrium: Right atrial size was normal in size. Pericardium: Trivial pericardial effusion is present. The pericardial effusion is anterior to the right ventricle. Presence of epicardial fat layer. Mitral Valve: Small mobile mass on the posterior mitral leaflet consistent with degenerative calc. The mitral valve is degenerative in appearance. Trivial mitral valve regurgitation. No evidence of mitral valve stenosis. Tricuspid Valve: The tricuspid valve is normal in structure. Tricuspid valve regurgitation is not demonstrated. No evidence of tricuspid stenosis. Aortic Valve: The aortic valve is normal in structure. Aortic valve regurgitation is mild. Aortic valve sclerosis/calcification is present, without any evidence of aortic stenosis. Aortic valve mean gradient measures 8.0 mmHg. Aortic valve peak gradient measures 15.8 mmHg. Aortic valve area, by VTI measures 2.22 cm. Pulmonic Valve: The pulmonic valve was normal in structure. Pulmonic valve regurgitation is not visualized. No evidence of pulmonic stenosis. Aorta: The aortic root is normal in size and structure. Venous: The inferior vena cava is normal in size with greater than 50% respiratory variability, suggesting right atrial pressure of 3 mmHg. IAS/Shunts: No atrial level shunt detected by color flow Doppler.  LEFT VENTRICLE PLAX 2D LVIDd:         5.40 cm     Diastology LVIDs:         3.90 cm     LV e' medial:  4.24 cm/s LV PW:         1.00 cm     LV E/e' medial:  15.0 LV IVS:        1.20 cm     LV e' lateral:   3.59 cm/s LVOT diam:     2.00 cm      LV E/e' lateral: 17.8 LV SV:         84 LV SV Index:   38 LVOT Area:     3.14 cm  LV Volumes (MOD) LV vol d, MOD A2C: 87.2 ml LV vol d, MOD A4C: 97.6 ml LV vol s, MOD A2C: 35.3 ml LV vol s, MOD A4C: 38.5 ml LV SV MOD A2C:     51.9 ml LV SV MOD A4C:     97.6 ml LV SV MOD BP:      55.4 ml RIGHT VENTRICLE             IVC RV Basal diam:  3.10 cm     IVC diam: 1.70 cm RV S prime:     15.20 cm/s TAPSE (M-mode): 2.5 cm      PULMONARY VEINS                             Diastolic Velocity: 34.30 cm/s                             S/D Velocity:       1.30                             Systolic Velocity:  46.30 cm/s LEFT ATRIUM             Index        RIGHT ATRIUM           Index LA diam:        3.90 cm 1.78 cm/m   RA Area:     15.50 cm LA Vol (A2C):   71.3 ml 32.60 ml/m  RA Volume:   35.50 ml  16.23 ml/m LA Vol (A4C):   80.4 ml 36.76 ml/m LA Biplane Vol: 78.6 ml 35.94 ml/m  AORTIC VALVE AV Area (Vmax):    1.94 cm AV Area (Vmean):   1.77 cm AV Area (VTI):     2.22 cm AV Vmax:           199.00 cm/s AV Vmean:          135.000 cm/s AV VTI:            0.379 m AV Peak Grad:      15.8 mmHg AV Mean Grad:      8.0 mmHg LVOT Vmax:         123.00 cm/s LVOT Vmean:        76.100 cm/s LVOT VTI:          0.268 m LVOT/AV VTI ratio: 0.71  AORTA Ao Root diam: 3.50 cm Ao Asc diam:  3.30 cm MITRAL VALVE MV Area (PHT): 2.49 cm    SHUNTS MV Decel Time: 305 msec    Systemic VTI:  0.27 m MV E velocity: 63.80 cm/s  Systemic Diam: 2.00 cm MV A velocity: 86.10 cm/s MV E/A ratio:  0.74 Kardie Tobb DO Electronically signed by Dub Huntsman DO Signature Date/Time: 07/15/2024/12:29:09 PM    Final  DG CHEST PORT 1 VIEW Result Date: 07/14/2024 EXAM: 1 VIEW(S) XRAY OF THE CHEST 07/14/2024 07:15:00 AM COMPARISON: 07/12/2024 CLINICAL HISTORY: Acute on chronic respiratory failure with hypoxia (HCC) [id number] FINDINGS: LINES, TUBES AND DEVICES: Spinal stimulator noted. LUNGS AND PLEURA: Central pulmonary vascular congestion again noted. Blunting of  the right costophrenic sulcus, possibly reflecting small pleural effusion. No focal pulmonary opacity. No pneumothorax. HEART AND MEDIASTINUM: Stable cardiomegaly. BONES AND SOFT TISSUES: No acute osseous abnormality. IMPRESSION: 1. Redemonstrated cardiomegaly with unchanged central pulmonary vascular congestion. 2. Blunting of the right costophrenic sulcus, possibly reflecting a small pleural effusion. Electronically signed by: Rogelia Myers MD 07/14/2024 10:59 AM EST RP Workstation: [id number]   DG CHEST PORT 1 VIEW Result Date: 07/12/2024 EXAM: 1 VIEW(S) XRAY OF THE CHEST 07/12/2024 02:56:00 PM COMPARISON: 07/09/2024 CLINICAL HISTORY: Shortness of breath 10026; 200808 Hypoxia 200808 FINDINGS: LINES, TUBES AND DEVICES: Neurostimulator leads noted overlying the midthoracic spine. LUNGS AND PLEURA: Vascular congestion is noted. Basilar atelectatic changes are seen. Possible trace bilateral pleural effusions. No focal pulmonary opacity. No pneumothorax. HEART AND MEDIASTINUM: Mild cardiomegaly. BONES AND SOFT TISSUES: No acute osseous abnormality. IMPRESSION: 1. Vascular congestion and mild cardiomegaly. 2. Possible trace bilateral pleural effusions. 3. Basilar atelectatic changes. Electronically signed by: Oneil Devonshire MD 07/12/2024 07:55 PM EST RP Workstation: [id number]   CT Angio Chest PE W and/or Wo Contrast Result Date: 07/09/2024 EXAM: CTA of the Chest with contrast for PE 07/09/2024 01:59:46 PM TECHNIQUE: CTA of the chest was performed without and with the administration of 75 mL of iohexol  (OMNIPAQUE ) 350 MG/ML injection. Multiplanar reformatted images are provided for review. MIP images are provided for review. Automated exposure control, iterative reconstruction, and/or weight based adjustment of the mA/kV was utilized to reduce the radiation dose to as low as reasonably achievable. Mild motion artifact is present. COMPARISON: Chest radiograph 07/09/2024. CLINICAL HISTORY: Pulmonary embolism (PE)  suspected, high prob. FINDINGS: PULMONARY ARTERIES: Good opacification of the central and segmental pulmonary arteries. No focal filling defects. No evidence of significant pulmonary embolus. Main pulmonary artery is normal in caliber. MEDIASTINUM: Normal heart size. No pericardial effusions. Normal caliber thoracic aorta. No dissection. Calcification of the aorta and coronary arteries. Moderate-sized esophageal hiatal hernia. The esophagus is decompressed. LYMPH NODES: No significant lymphadenopathy. LUNGS AND PLEURA: Patchy infiltrates demonstrated in the right upper lung, right middle lung, and left lingula. These may represent multifocal pneumonia or aspiration. There is evidence of opacification in lower lobe bronchi with bronchial wall thickening, possibly mucous plugging or aspiration. No pleural effusion or pneumothorax. UPPER ABDOMEN: 1.6 cm diameter left adrenal gland nodule with attenuation measurements of 2.7 cm. SOFT TISSUES AND BONES: Spinal cord stimulator leads in the thoracic spine. Degenerative changes in the spine. No acute soft tissue abnormality. IMPRESSION: 1. No evidence of pulmonary embolism. 2. Patchy infiltrates in the right upper lung, right middle lung, and left lingula, possibly representing multifocal pneumonia or aspiration. 3. Findings suggestive of lower lobe airway mucus plugging or aspiration. 4. Moderate-sized esophageal hiatal hernia with decompressed esophagus. 5. Aortic and coronary artery calcifications. Electronically signed by: Elsie Gravely MD 07/09/2024 02:17 PM EST RP Workstation: [id number]   DG Chest 1 View Result Date: 07/09/2024 CLINICAL DATA:  Sepsis. EXAM: DG CHEST 1V COMPARISON:  Chest radiograph dated 07/08/2024 FINDINGS: No focal consolidation, pleural effusion or pneumothorax. There is mild cardiomegaly with mild vascular congestion. Moderate size hiatal hernia. Spinal stimulator. No acute osseous pathology. IMPRESSION: 1. Mild cardiomegaly with mild  vascular congestion. No focal consolidation. 2. Moderate  size hiatal hernia. Electronically Signed   By: Vanetta Chou M.D.   On: 07/09/2024 10:55   DG Chest 2 View Result Date: 07/08/2024 EXAM: 2 VIEW(S) XRAY OF THE CHEST 07/08/2024 04:05:00 PM COMPARISON: None available. CLINICAL HISTORY: cough FINDINGS: LINES, TUBES AND DEVICES: Spinal stimulator in place. LUNGS AND PLEURA: Hazy interstitial opacity at right lung base. No pleural effusion. No pneumothorax. HEART AND MEDIASTINUM: No acute abnormality of the cardiac and mediastinal silhouettes. BONES AND SOFT TISSUES: No acute osseous abnormality. IMPRESSION: 1. Hazy interstitial opacity at the right lung base, which may reflect atelectasis versus early pneumonitis. Electronically signed by: Greig Pique MD 07/08/2024 04:32 PM EST RP Workstation: [id number]   CT ABDOMEN PELVIS W CONTRAST Result Date: 07/07/2024 CLINICAL DATA:  Iron -deficiency anemia. Gastric ulcer. Chronic constipation. EXAM: CT ABDOMEN AND PELVIS WITH CONTRAST TECHNIQUE: Multidetector CT imaging of the abdomen and pelvis was performed using the standard protocol following bolus administration of intravenous contrast. RADIATION DOSE REDUCTION: This exam was performed according to the departmental dose-optimization program which includes automated exposure control, adjustment of the mA and/or kV according to patient size and/or use of iterative reconstruction technique. CONTRAST:  OMNIPAQUE  IOHEXOL  300 MG/ML  SOLN COMPARISON:  12/14/2020 FINDINGS: Lower Chest: No acute findings. Hepatobiliary: No suspicious hepatic masses identified. Stable small cyst in the central right lobe adjacent to the IVC. Gallbladder is unremarkable. No evidence of biliary ductal dilatation. Pancreas:  No mass or inflammatory changes. Spleen: Within normal limits in size and appearance. Adrenals/Urinary Tract: Stable small left adrenal mass, consistent with benign adenoma. No renal masses identified. No  evidence of ureteral calculi or hydronephrosis. Unremarkable unopacified urinary bladder. Stomach/Bowel: Moderate size hiatal hernia again seen. No masses identified. No evidence of obstruction, inflammatory process or abnormal fluid collections. Normal appendix visualized. Diverticulosis is seen mainly involving the sigmoid colon, however there is no evidence of diverticulitis. Vascular/Lymphatic: No pathologically enlarged lymph nodes. No acute vascular findings. Reproductive: Prior hysterectomy noted. Adnexal regions are unremarkable in appearance. Other:  None. Musculoskeletal: No suspicious bone lesions identified. Left hip prosthesis is seen. Neurostimulator device noted with leads in the thoracic spinal canal. IMPRESSION: No acute findings. Colonic diverticulosis, without radiographic evidence of diverticulitis. Moderate hiatal hernia. Electronically Signed   By: Norleen DELENA Kil M.D.   On: 07/07/2024 12:37     TODAY-DAY OF DISCHARGE:  Subjective:   Rock Chancy today has no headache,no chest abdominal pain,no new weakness tingling or numbness, feels much better wants to go home today.   Objective:   Blood pressure (!) 142/77, pulse 73, temperature 97.7 F (36.5 C), temperature source Oral, resp. rate (!) 23, height 5' 6 (1.676 m), weight 108 kg, SpO2 93%.  Intake/Output Summary (Last 24 hours) at 07/16/2024 0912 Last data filed at 07/15/2024 1500 Gross per 24 hour  Intake 700 ml  Output --  Net 700 ml   Filed Weights   07/14/24 0500 07/15/24 0500 07/16/24 0500  Weight: 117.2 kg 112 kg 108 kg    Exam: Awake Alert, Oriented *3, No new F.N deficits, Normal affect [place].AT,PERRAL Supple Neck,No JVD, No cervical lymphadenopathy appriciated.  Symmetrical Chest wall movement, Good air movement bilaterally, CTAB RRR,No Gallops,Rubs or new Murmurs, No Parasternal Heave +ve B.Sounds, Abd Soft, Non tender, No organomegaly appriciated, No rebound -guarding or rigidity. No Cyanosis, Clubbing  or edema, No new Rash or bruise   PERTINENT RADIOLOGIC STUDIES: ECHOCARDIOGRAM COMPLETE Result Date: 07/15/2024    ECHOCARDIOGRAM REPORT   Patient Name:   Kendra [name] Date  of Exam: 07/15/2024 Medical Rec #:  [id number]      Height:       66.0 in Accession #:    [id number]     Weight:       246.9 lb Date of Birth:  [birth year]      BSA:          2.187 m Patient Age:    78 years       BP:           138/89 mmHg Patient Gender: F              HR:           56 bpm. Exam Location:  Inpatient Procedure: 2D Echo, Cardiac Doppler and Color Doppler (Both Spectral and Color            Flow Doppler were utilized during procedure). Indications:    Acute Respiratory Distres R06.03  History:        Patient has prior history of Echocardiogram examinations, most                 recent 07/14/2021. Risk Factors:Hypertension.  Sonographer:    Nathanel Devonshire Referring Phys: LEOMA DONALDA M Chaundra [facility] IMPRESSIONS  1. Left ventricular ejection fraction, by estimation, is 55 to 60%. The left ventricle has normal function. The left ventricle has no regional wall motion abnormalities. There is mild concentric left ventricular hypertrophy. Left ventricular diastolic parameters are indeterminate.  2. Right ventricular systolic function is normal. The right ventricular size is normal.  3. Left atrial size was mildly dilated.  4. Small mobile mass on the posterior mitral leaflet consistent with degenerative calc. The mitral valve is degenerative. Trivial mitral valve regurgitation. No evidence of mitral stenosis.  5. The aortic valve is normal in structure. Aortic valve regurgitation is mild. Aortic valve sclerosis/calcification is present, without any evidence of aortic stenosis.  6. The inferior vena cava is normal in size with greater than 50% respiratory variability, suggesting right atrial pressure of 3 mmHg. FINDINGS  Left Ventricle: Left ventricular ejection fraction, by estimation, is 55 to 60%. The left ventricle has normal function. The  left ventricle has no regional wall motion abnormalities. The left ventricular internal cavity size was normal in size. There is  mild concentric left ventricular hypertrophy. Left ventricular diastolic parameters are indeterminate. Right Ventricle: The right ventricular size is normal. No increase in right ventricular wall thickness. Right ventricular systolic function is normal. Left Atrium: Left atrial size was mildly dilated. Right Atrium: Right atrial size was normal in size. Pericardium: Trivial pericardial effusion is present. The pericardial effusion is anterior to the right ventricle. Presence of epicardial fat layer. Mitral Valve: Small mobile mass on the posterior mitral leaflet consistent with degenerative calc. The mitral valve is degenerative in appearance. Trivial mitral valve regurgitation. No evidence of mitral valve stenosis. Tricuspid Valve: The tricuspid valve is normal in structure. Tricuspid valve regurgitation is not demonstrated. No evidence of tricuspid stenosis. Aortic Valve: The aortic valve is normal in structure. Aortic valve regurgitation is mild. Aortic valve sclerosis/calcification is present, without any evidence of aortic stenosis. Aortic valve mean gradient measures 8.0 mmHg. Aortic valve peak gradient measures 15.8 mmHg. Aortic valve area, by VTI measures 2.22 cm. Pulmonic Valve: The pulmonic valve was normal in structure. Pulmonic valve regurgitation is not visualized. No evidence of pulmonic stenosis. Aorta: The aortic root is normal in size and structure. Venous: The inferior vena cava is normal in size with greater than  50% respiratory variability, suggesting right atrial pressure of 3 mmHg. IAS/Shunts: No atrial level shunt detected by color flow Doppler.  LEFT VENTRICLE PLAX 2D LVIDd:         5.40 cm     Diastology LVIDs:         3.90 cm     LV e' medial:    4.24 cm/s LV PW:         1.00 cm     LV E/e' medial:  15.0 LV IVS:        1.20 cm     LV e' lateral:   3.59 cm/s LVOT  diam:     2.00 cm     LV E/e' lateral: 17.8 LV SV:         84 LV SV Index:   38 LVOT Area:     3.14 cm  LV Volumes (MOD) LV vol d, MOD A2C: 87.2 ml LV vol d, MOD A4C: 97.6 ml LV vol s, MOD A2C: 35.3 ml LV vol s, MOD A4C: 38.5 ml LV SV MOD A2C:     51.9 ml LV SV MOD A4C:     97.6 ml LV SV MOD BP:      55.4 ml RIGHT VENTRICLE             IVC RV Basal diam:  3.10 cm     IVC diam: 1.70 cm RV S prime:     15.20 cm/s TAPSE (M-mode): 2.5 cm      PULMONARY VEINS                             Diastolic Velocity: 34.30 cm/s                             S/D Velocity:       1.30                             Systolic Velocity:  46.30 cm/s LEFT ATRIUM             Index        RIGHT ATRIUM           Index LA diam:        3.90 cm 1.78 cm/m   RA Area:     15.50 cm LA Vol (A2C):   71.3 ml 32.60 ml/m  RA Volume:   35.50 ml  16.23 ml/m LA Vol (A4C):   80.4 ml 36.76 ml/m LA Biplane Vol: 78.6 ml 35.94 ml/m  AORTIC VALVE AV Area (Vmax):    1.94 cm AV Area (Vmean):   1.77 cm AV Area (VTI):     2.22 cm AV Vmax:           199.00 cm/s AV Vmean:          135.000 cm/s AV VTI:            0.379 m AV Peak Grad:      15.8 mmHg AV Mean Grad:      8.0 mmHg LVOT Vmax:         123.00 cm/s LVOT Vmean:        76.100 cm/s LVOT VTI:          0.268 m LVOT/AV VTI ratio: 0.71  AORTA Ao Root diam: 3.50 cm Ao Asc diam:  3.30 cm MITRAL VALVE  MV Area (PHT): 2.49 cm    SHUNTS MV Decel Time: 305 msec    Systemic VTI:  0.27 m MV E velocity: 63.80 cm/s  Systemic Diam: 2.00 cm MV A velocity: 86.10 cm/s MV E/A ratio:  0.74 Kardie Tobb DO Electronically signed by Dub Huntsman DO Signature Date/Time: 07/15/2024/12:29:09 PM    Final      PERTINENT LAB RESULTS: CBC: Recent Labs    07/14/24 0742  WBC 5.6  HGB 9.1*  HCT 32.2*  PLT 263   CMET CMP     Component Value Date/Time   NA 142 07/16/2024 0416   NA 138 08/03/2021 1031   K 3.7 07/16/2024 0416   CL 94 (L) 07/16/2024 0416   CO2 42 (H) 07/16/2024 0416   GLUCOSE 87 07/16/2024 0416   BUN 17  07/16/2024 0416   BUN 13 08/03/2021 1031   CREATININE 0.51 07/16/2024 0416   CALCIUM  9.2 07/16/2024 0416   PROT 6.5 07/09/2024 1122   ALBUMIN  3.6 07/09/2024 1122   AST 25 07/09/2024 1122   ALT 16 07/09/2024 1122   ALKPHOS 81 07/09/2024 1122   BILITOT 0.3 07/09/2024 1122   EGFR 95 08/03/2021 1031   GFRNONAA >60 07/16/2024 0416    GFR Estimated Creatinine Clearance: 74.4 mL/min (by C-G formula based on SCr of 0.51 mg/dL). No results for input(s): LIPASE, AMYLASE in the last 72 hours. No results for input(s): CKTOTAL, CKMB, CKMBINDEX, TROPONINI in the last 72 hours. Invalid input(s): POCBNP No results for input(s): DDIMER in the last 72 hours. No results for input(s): HGBA1C in the last 72 hours. No results for input(s): CHOL, HDL, LDLCALC, TRIG, CHOLHDL, LDLDIRECT in the last 72 hours. No results for input(s): TSH, T4TOTAL, T3FREE, THYROIDAB in the last 72 hours.  Invalid input(s): FREET3 No results for input(s): VITAMINB12, FOLATE, FERRITIN, TIBC, IRON , RETICCTPCT in the last 72 hours. Coags: No results for input(s): INR in the last 72 hours.  Invalid input(s): PT Microbiology: Recent Results (from the past 240 hours)  Resp panel by RT-PCR (RSV, Flu A&B, Covid) Peripheral     Status: Abnormal   Collection Time: 07/09/24 10:11 AM   Specimen: Peripheral; Nasal Swab  Result Value Ref Range Status   SARS Coronavirus 2 by RT PCR POSITIVE (A) NEGATIVE Final   Influenza A by PCR POSITIVE (A) NEGATIVE Final   Influenza B by PCR NEGATIVE NEGATIVE Final    Comment: (NOTE) The Xpert Xpress SARS-CoV-2/FLU/RSV plus assay is intended as an aid in the diagnosis of influenza from Nasopharyngeal swab specimens and should not be used as a sole basis for treatment. Nasal washings and aspirates are unacceptable for Xpert Xpress SARS-CoV-2/FLU/RSV testing.  Fact Sheet for Patients: bloggercourse.com  Fact Sheet  for Healthcare Providers: seriousbroker.it  This test is not yet approved or cleared by the [place]  FDA and has been authorized for detection and/or diagnosis of SARS-CoV-2 by FDA under an Emergency Use Authorization (EUA). This EUA will remain in effect (meaning this test can be used) for the duration of the COVID-19 declaration under Section 564(b)(1) of the Act, 21 U.S.C. section 360bbb-3(b)(1), unless the authorization is terminated or revoked.     Resp Syncytial Virus by PCR NEGATIVE NEGATIVE Final    Comment: (NOTE) Fact Sheet for Patients: bloggercourse.com  Fact Sheet for Healthcare Providers: seriousbroker.it  This test is not yet approved or cleared by the [place]  FDA and has been authorized for detection and/or diagnosis of SARS-CoV-2 by FDA under an Emergency Use Authorization (EUA).  This EUA will remain in effect (meaning this test can be used) for the duration of the COVID-19 declaration under Section 564(b)(1) of the Act, 21 U.S.C. section 360bbb-3(b)(1), unless the authorization is terminated or revoked.  Performed at [facility], [address] [place], [place] [address]   Blood Culture (routine x 2)     Status: None   Collection Time: 07/09/24 10:11 AM   Specimen: BLOOD  Result Value Ref Range Status   Specimen Description BLOOD SITE NOT SPECIFIED  Final   Special Requests   Final    BOTTLES DRAWN AEROBIC AND ANAEROBIC Blood Culture results may not be optimal due to an inadequate volume of blood received in culture bottles   Culture   Final    NO GROWTH 5 DAYS Performed at [facility], [address], [place], [place] [address]    Report Status 07/14/2024 FINAL  Final  Blood Culture (routine x 2)     Status: None   Collection Time: 07/09/24 11:15 AM   Specimen: BLOOD  Result Value Ref Range Status   Specimen Description BLOOD SITE NOT SPECIFIED  Final    Special Requests   Final    BOTTLES DRAWN AEROBIC AND ANAEROBIC Blood Culture results may not be optimal due to an inadequate volume of blood received in culture bottles   Culture   Final    NO GROWTH 5 DAYS Performed at [facility], [address], [place], [place] [address]    Report Status 07/14/2024 FINAL  Final    FURTHER DISCHARGE INSTRUCTIONS:  Get Medicines reviewed and adjusted: Please take all your medications with you for your next visit with your Primary MD  Laboratory/radiological data: Please request your Primary MD to go over all hospital tests and procedure/radiological results at the follow up, please ask your Primary MD to get all Hospital records sent to his/her office.  In some cases, they will be blood work, cultures and biopsy results pending at the time of your discharge. Please request that your primary care M.D. goes through all the records of your hospital data and follows up on these results.  Also Note the following: If you experience worsening of your admission symptoms, develop shortness of breath, life threatening emergency, suicidal or homicidal thoughts you must seek medical attention immediately by calling 911 or calling your MD immediately  if symptoms less severe.  You must read complete instructions/literature along with all the possible adverse reactions/side effects for all the Medicines you take and that have been prescribed to you. Take any new Medicines after you have completely understood and accpet all the possible adverse reactions/side effects.   Do not drive when taking Pain medications or sleeping medications (Benzodaizepines)  Do not take more than prescribed Pain, Sleep and Anxiety Medications. It is not advisable to combine anxiety,sleep and pain medications without talking with your primary care practitioner  Special Instructions: If you have smoked or chewed Tobacco  in the last 2 yrs please stop smoking, stop any regular  Alcohol  and or any Recreational drug use.  Wear Seat belts while driving.  Please note: You were cared for by a hospitalist during your hospital stay. Once you are discharged, your primary care physician will handle any further medical issues. Please note that NO REFILLS for any discharge medications will be authorized once you are discharged, as it is imperative that you return to your primary care physician (or establish a relationship with a primary care  physician if you do not have one) for your post hospital discharge needs so that they can reassess your need for medications and monitor your lab values.  Total Time spent coordinating discharge including counseling, education and face to face time equals greater than 30 minutes.  Signed: Donalda Applebaum 07/16/2024 9:12 AM      [1]  Allergies Allergen Reactions   Bee Venom Anaphylaxis   Feldene [Piroxicam] Other (See Comments)    unknown   Naphazoline Other (See Comments)    Other Reaction(s): Not available     Cephalosporins Rash   Flexeril [Cyclobenzaprine] Nausea Only   Keflex [Cephalexin] Rash   "

## 2024-07-16 NOTE — Plan of Care (Signed)
°  Problem: Education: Goal: Knowledge of risk factors and measures for prevention of condition will improve Outcome: Progressing   Problem: Coping: Goal: Psychosocial and spiritual needs will be supported Outcome: Progressing   Problem: Respiratory: Goal: Will maintain a patent airway Outcome: Progressing Goal: Complications related to the disease process, condition or treatment will be avoided or minimized Outcome: Progressing   Problem: Education: Goal: Ability to describe self-care measures that may prevent or decrease complications (Diabetes Survival Skills Education) will improve Outcome: Progressing Goal: Individualized Educational Video(s) Outcome: Progressing   Problem: Coping: Goal: Ability to adjust to condition or change in health will improve Outcome: Progressing   Problem: Fluid Volume: Goal: Ability to maintain a balanced intake and output will improve Outcome: Progressing   Problem: Health Behavior/Discharge Planning: Goal: Ability to identify and utilize available resources and services will improve Outcome: Progressing Goal: Ability to manage health-related needs will improve Outcome: Progressing   Problem: Metabolic: Goal: Ability to maintain appropriate glucose levels will improve Outcome: Progressing   Problem: Nutritional: Goal: Maintenance of adequate nutrition will improve Outcome: Progressing Goal: Progress toward achieving an optimal weight will improve Outcome: Progressing   Problem: Skin Integrity: Goal: Risk for impaired skin integrity will decrease Outcome: Progressing   Problem: Tissue Perfusion: Goal: Adequacy of tissue perfusion will improve Outcome: Progressing   Problem: Activity: Goal: Ability to tolerate increased activity will improve Outcome: Progressing   Problem: Clinical Measurements: Goal: Ability to maintain a body temperature in the normal range will improve Outcome: Progressing   Problem: Respiratory: Goal:  Ability to maintain adequate ventilation will improve Outcome: Progressing Goal: Ability to maintain a clear airway will improve Outcome: Progressing   Problem: Education: Goal: Knowledge of General Education information will improve Description: Including pain rating scale, medication(s)/side effects and non-pharmacologic comfort measures Outcome: Progressing   Problem: Health Behavior/Discharge Planning: Goal: Ability to manage health-related needs will improve Outcome: Progressing   Problem: Clinical Measurements: Goal: Ability to maintain clinical measurements within normal limits will improve Outcome: Progressing Goal: Will remain free from infection Outcome: Progressing Goal: Diagnostic test results will improve Outcome: Progressing Goal: Respiratory complications will improve Outcome: Progressing Goal: Cardiovascular complication will be avoided Outcome: Progressing   Problem: Activity: Goal: Risk for activity intolerance will decrease Outcome: Progressing   Problem: Nutrition: Goal: Adequate nutrition will be maintained Outcome: Progressing   Problem: Coping: Goal: Level of anxiety will decrease Outcome: Progressing   Problem: Elimination: Goal: Will not experience complications related to bowel motility Outcome: Progressing Goal: Will not experience complications related to urinary retention Outcome: Progressing   Problem: Pain Managment: Goal: General experience of comfort will improve and/or be controlled Outcome: Progressing   Problem: Safety: Goal: Ability to remain free from injury will improve Outcome: Progressing   Problem: Skin Integrity: Goal: Risk for impaired skin integrity will decrease Outcome: Progressing

## 2024-07-16 NOTE — Progress Notes (Signed)
 Reviewed AVS, patient expressed understanding of medications, MD follow up reviewed.   Removed IV, Site clean, dry and intact.  See LDA for information on wounds at discharge. Patient states all belongings brought to the hospital at time of admission are accounted for and packed to take home.  Picked up medications from [facility] pharmacy. [facility] contacted to transport patient to Discharge lounge to wait for transportation home.

## 2024-07-27 ENCOUNTER — Telehealth: Payer: Self-pay | Admitting: Hematology and Oncology

## 2024-07-27 NOTE — Telephone Encounter (Signed)
 07/27/24 Spoke with patient and rescheduled New Hematology appt.

## 2024-07-30 ENCOUNTER — Encounter: Payer: Self-pay | Admitting: Pulmonary Disease

## 2024-07-30 ENCOUNTER — Ambulatory Visit (INDEPENDENT_AMBULATORY_CARE_PROVIDER_SITE_OTHER): Admitting: Pulmonary Disease

## 2024-07-30 VITALS — BP 142/81 | HR 74 | Ht 66.5 in | Wt 254.2 lb

## 2024-07-30 DIAGNOSIS — J9601 Acute respiratory failure with hypoxia: Secondary | ICD-10-CM | POA: Diagnosis not present

## 2024-07-30 DIAGNOSIS — B948 Sequelae of other specified infectious and parasitic diseases: Secondary | ICD-10-CM

## 2024-07-30 DIAGNOSIS — J45909 Unspecified asthma, uncomplicated: Secondary | ICD-10-CM

## 2024-07-30 DIAGNOSIS — D649 Anemia, unspecified: Secondary | ICD-10-CM

## 2024-07-30 DIAGNOSIS — Z87891 Personal history of nicotine dependence: Secondary | ICD-10-CM

## 2024-07-30 MED ORDER — FLUTICASONE-SALMETEROL 250-50 MCG/ACT IN AEPB: 1.0000 | INHALATION_SPRAY | Freq: Two times a day (BID) | RESPIRATORY_TRACT | 3 refills | Status: AC

## 2024-07-30 NOTE — Progress Notes (Signed)
 "  @Patient  ID: Kendra [name], female    DOB: [birth year], 79 y.o.   MRN: [id number]  Chief Complaint  Patient presents with   Consult    Pt states hospital f/u  - Dx covid /flu/ pneumonia     Referring provider: Raenelle Donalda HERO, MD  HPI:   79 y.o. woman with past reported history of asthma whom we are seeing in hospital follow-up for acute hypoxemic respiratory failure in the setting of concomitant flu and COVID viral pneumonia.  Multiple hospital notes reviewed.  Most recent PCP note that preceded hospitalization reviewed.  Patient developed a bad cough.  Viral infection.  Congestion considered.  Shortness of breath.  Went to the ED.  Flu and COVID swab positive.  Flu A.  CTA PE protocol showed no PE faint scattered ground glass opacities.  She was admitted to the hospital with hypoxemia.  Her hypoxemia was gradually improved but still had persistent hypoxemia.  Her chest x-ray, most recently, 07/14/2024 revealed hazy alveolar interstitial opacities right greater than left on my review and interpretation.  She was eventually discharged with oxygen.  Since discharge her oxygen at rest has improved significantly.  Mainly in the high to mid 90s on her report.  Off oxygen.  With exertion her oxygen drops without oxygen.  As low as 87% most recently.  Today she is 96% after ambulating into the room.  She has not worn her oxygen in the last 2 days.  We discussed continue to check her oxygen with exertion and wearing oxygen anytime she has exertion.  Also discussed experimenting with not wearing oxygen with exertion and monitoring.  If 88% or higher is okay to stay off oxygen.  We discussed her lung injury with recent viral infection.  Her improvement which is very encouraging.  Discussed it can take weeks or months to continue to improve.  Likely has some shortness of breath, chest discomfort.  Has reactivation of prior asthma.  Very common after viral infections again can linger for weeks to months.   Discussed medication to help with this.  We discussed repeat imaging in the future.  Questionaires / Pulmonary Flowsheets:   ACT:      No data to display          MMRC:     No data to display          Epworth:      No data to display          Tests:   FENO:  No results found for: NITRICOXIDE  PFT:     No data to display          WALK:      No data to display          Imaging: Personally reviewed and as per EMR and discussion this note ECHOCARDIOGRAM COMPLETE Result Date: 07/15/2024    ECHOCARDIOGRAM REPORT   Patient Name:   Kendra [name] Date of Exam: 07/15/2024 Medical Rec #:  [id number]      Height:       66.0 in Accession #:    [id number]     Weight:       246.9 lb Date of Birth:  [birth year]      BSA:          2.187 m Patient Age:    78 years       BP:           138/89 mmHg Patient Gender: F  HR:           56 bpm. Exam Location:  Inpatient Procedure: 2D Echo, Cardiac Doppler and Color Doppler (Both Spectral and Color            Flow Doppler were utilized during procedure). Indications:    Acute Respiratory Distres R06.03  History:        Patient has prior history of Echocardiogram examinations, most                 recent 07/14/2021. Risk Factors:Hypertension.  Sonographer:    Nathanel Devonshire Referring Phys: LEOMA DONALDA M GHIMIRE IMPRESSIONS  1. Left ventricular ejection fraction, by estimation, is 55 to 60%. The left ventricle has normal function. The left ventricle has no regional wall motion abnormalities. There is mild concentric left ventricular hypertrophy. Left ventricular diastolic parameters are indeterminate.  2. Right ventricular systolic function is normal. The right ventricular size is normal.  3. Left atrial size was mildly dilated.  4. Small mobile mass on the posterior mitral leaflet consistent with degenerative calc. The mitral valve is degenerative. Trivial mitral valve regurgitation. No evidence of mitral stenosis.  5. The aortic  valve is normal in structure. Aortic valve regurgitation is mild. Aortic valve sclerosis/calcification is present, without any evidence of aortic stenosis.  6. The inferior vena cava is normal in size with greater than 50% respiratory variability, suggesting right atrial pressure of 3 mmHg. FINDINGS  Left Ventricle: Left ventricular ejection fraction, by estimation, is 55 to 60%. The left ventricle has normal function. The left ventricle has no regional wall motion abnormalities. The left ventricular internal cavity size was normal in size. There is  mild concentric left ventricular hypertrophy. Left ventricular diastolic parameters are indeterminate. Right Ventricle: The right ventricular size is normal. No increase in right ventricular wall thickness. Right ventricular systolic function is normal. Left Atrium: Left atrial size was mildly dilated. Right Atrium: Right atrial size was normal in size. Pericardium: Trivial pericardial effusion is present. The pericardial effusion is anterior to the right ventricle. Presence of epicardial fat layer. Mitral Valve: Small mobile mass on the posterior mitral leaflet consistent with degenerative calc. The mitral valve is degenerative in appearance. Trivial mitral valve regurgitation. No evidence of mitral valve stenosis. Tricuspid Valve: The tricuspid valve is normal in structure. Tricuspid valve regurgitation is not demonstrated. No evidence of tricuspid stenosis. Aortic Valve: The aortic valve is normal in structure. Aortic valve regurgitation is mild. Aortic valve sclerosis/calcification is present, without any evidence of aortic stenosis. Aortic valve mean gradient measures 8.0 mmHg. Aortic valve peak gradient measures 15.8 mmHg. Aortic valve area, by VTI measures 2.22 cm. Pulmonic Valve: The pulmonic valve was normal in structure. Pulmonic valve regurgitation is not visualized. No evidence of pulmonic stenosis. Aorta: The aortic root is normal in size and structure.  Venous: The inferior vena cava is normal in size with greater than 50% respiratory variability, suggesting right atrial pressure of 3 mmHg. IAS/Shunts: No atrial level shunt detected by color flow Doppler.  LEFT VENTRICLE PLAX 2D LVIDd:         5.40 cm     Diastology LVIDs:         3.90 cm     LV e' medial:    4.24 cm/s LV PW:         1.00 cm     LV E/e' medial:  15.0 LV IVS:        1.20 cm     LV e' lateral:  3.59 cm/s LVOT diam:     2.00 cm     LV E/e' lateral: 17.8 LV SV:         84 LV SV Index:   38 LVOT Area:     3.14 cm  LV Volumes (MOD) LV vol d, MOD A2C: 87.2 ml LV vol d, MOD A4C: 97.6 ml LV vol s, MOD A2C: 35.3 ml LV vol s, MOD A4C: 38.5 ml LV SV MOD A2C:     51.9 ml LV SV MOD A4C:     97.6 ml LV SV MOD BP:      55.4 ml RIGHT VENTRICLE             IVC RV Basal diam:  3.10 cm     IVC diam: 1.70 cm RV S prime:     15.20 cm/s TAPSE (M-mode): 2.5 cm      PULMONARY VEINS                             Diastolic Velocity: 34.30 cm/s                             S/D Velocity:       1.30                             Systolic Velocity:  46.30 cm/s LEFT ATRIUM             Index        RIGHT ATRIUM           Index LA diam:        3.90 cm 1.78 cm/m   RA Area:     15.50 cm LA Vol (A2C):   71.3 ml 32.60 ml/m  RA Volume:   35.50 ml  16.23 ml/m LA Vol (A4C):   80.4 ml 36.76 ml/m LA Biplane Vol: 78.6 ml 35.94 ml/m  AORTIC VALVE AV Area (Vmax):    1.94 cm AV Area (Vmean):   1.77 cm AV Area (VTI):     2.22 cm AV Vmax:           199.00 cm/s AV Vmean:          135.000 cm/s AV VTI:            0.379 m AV Peak Grad:      15.8 mmHg AV Mean Grad:      8.0 mmHg LVOT Vmax:         123.00 cm/s LVOT Vmean:        76.100 cm/s LVOT VTI:          0.268 m LVOT/AV VTI ratio: 0.71  AORTA Ao Root diam: 3.50 cm Ao Asc diam:  3.30 cm MITRAL VALVE MV Area (PHT): 2.49 cm    SHUNTS MV Decel Time: 305 msec    Systemic VTI:  0.27 m MV E velocity: 63.80 cm/s  Systemic Diam: 2.00 cm MV A velocity: 86.10 cm/s MV E/A ratio:  0.74 Kardie Tobb DO  Electronically signed by Dub Huntsman DO Signature Date/Time: 07/15/2024/12:29:09 PM    Final    DG CHEST PORT 1 VIEW Result Date: 07/14/2024 EXAM: 1 VIEW(S) XRAY OF THE CHEST 07/14/2024 07:15:00 AM COMPARISON: 07/12/2024 CLINICAL HISTORY: Acute on chronic respiratory failure with hypoxia (HCC) [id number] FINDINGS: LINES, TUBES AND DEVICES: Spinal stimulator noted. LUNGS AND PLEURA: Central  pulmonary vascular congestion again noted. Blunting of the right costophrenic sulcus, possibly reflecting small pleural effusion. No focal pulmonary opacity. No pneumothorax. HEART AND MEDIASTINUM: Stable cardiomegaly. BONES AND SOFT TISSUES: No acute osseous abnormality. IMPRESSION: 1. Redemonstrated cardiomegaly with unchanged central pulmonary vascular congestion. 2. Blunting of the right costophrenic sulcus, possibly reflecting a small pleural effusion. Electronically signed by: Rogelia Myers MD 07/14/2024 10:59 AM EST RP Workstation: [id number]   DG CHEST PORT 1 VIEW Result Date: 07/12/2024 EXAM: 1 VIEW(S) XRAY OF THE CHEST 07/12/2024 02:56:00 PM COMPARISON: 07/09/2024 CLINICAL HISTORY: Shortness of breath 10026; 200808 Hypoxia 200808 FINDINGS: LINES, TUBES AND DEVICES: Neurostimulator leads noted overlying the midthoracic spine. LUNGS AND PLEURA: Vascular congestion is noted. Basilar atelectatic changes are seen. Possible trace bilateral pleural effusions. No focal pulmonary opacity. No pneumothorax. HEART AND MEDIASTINUM: Mild cardiomegaly. BONES AND SOFT TISSUES: No acute osseous abnormality. IMPRESSION: 1. Vascular congestion and mild cardiomegaly. 2. Possible trace bilateral pleural effusions. 3. Basilar atelectatic changes. Electronically signed by: Oneil Devonshire MD 07/12/2024 07:55 PM EST RP Workstation: [id number]   CT Angio Chest PE W and/or Wo Contrast Result Date: 07/09/2024 EXAM: CTA of the Chest with contrast for PE 07/09/2024 01:59:46 PM TECHNIQUE: CTA of the chest was performed without and with the  administration of 75 mL of iohexol  (OMNIPAQUE ) 350 MG/ML injection. Multiplanar reformatted images are provided for review. MIP images are provided for review. Automated exposure control, iterative reconstruction, and/or weight based adjustment of the mA/kV was utilized to reduce the radiation dose to as low as reasonably achievable. Mild motion artifact is present. COMPARISON: Chest radiograph 07/09/2024. CLINICAL HISTORY: Pulmonary embolism (PE) suspected, high prob. FINDINGS: PULMONARY ARTERIES: Good opacification of the central and segmental pulmonary arteries. No focal filling defects. No evidence of significant pulmonary embolus. Main pulmonary artery is normal in caliber. MEDIASTINUM: Normal heart size. No pericardial effusions. Normal caliber thoracic aorta. No dissection. Calcification of the aorta and coronary arteries. Moderate-sized esophageal hiatal hernia. The esophagus is decompressed. LYMPH NODES: No significant lymphadenopathy. LUNGS AND PLEURA: Patchy infiltrates demonstrated in the right upper lung, right middle lung, and left lingula. These may represent multifocal pneumonia or aspiration. There is evidence of opacification in lower lobe bronchi with bronchial wall thickening, possibly mucous plugging or aspiration. No pleural effusion or pneumothorax. UPPER ABDOMEN: 1.6 cm diameter left adrenal gland nodule with attenuation measurements of 2.7 cm. SOFT TISSUES AND BONES: Spinal cord stimulator leads in the thoracic spine. Degenerative changes in the spine. No acute soft tissue abnormality. IMPRESSION: 1. No evidence of pulmonary embolism. 2. Patchy infiltrates in the right upper lung, right middle lung, and left lingula, possibly representing multifocal pneumonia or aspiration. 3. Findings suggestive of lower lobe airway mucus plugging or aspiration. 4. Moderate-sized esophageal hiatal hernia with decompressed esophagus. 5. Aortic and coronary artery calcifications. Electronically signed by:  Elsie Gravely MD 07/09/2024 02:17 PM EST RP Workstation: [id number]   DG Chest 1 View Result Date: 07/09/2024 CLINICAL DATA:  Sepsis. EXAM: DG CHEST 1V COMPARISON:  Chest radiograph dated 07/08/2024 FINDINGS: No focal consolidation, pleural effusion or pneumothorax. There is mild cardiomegaly with mild vascular congestion. Moderate size hiatal hernia. Spinal stimulator. No acute osseous pathology. IMPRESSION: 1. Mild cardiomegaly with mild vascular congestion. No focal consolidation. 2. Moderate size hiatal hernia. Electronically Signed   By: Vanetta Chou M.D.   On: 07/09/2024 10:55   DG Chest 2 View Result Date: 07/08/2024 EXAM: 2 VIEW(S) XRAY OF THE CHEST 07/08/2024 04:05:00 PM COMPARISON: None available. CLINICAL HISTORY: cough FINDINGS: LINES,  TUBES AND DEVICES: Spinal stimulator in place. LUNGS AND PLEURA: Hazy interstitial opacity at right lung base. No pleural effusion. No pneumothorax. HEART AND MEDIASTINUM: No acute abnormality of the cardiac and mediastinal silhouettes. BONES AND SOFT TISSUES: No acute osseous abnormality. IMPRESSION: 1. Hazy interstitial opacity at the right lung base, which may reflect atelectasis versus early pneumonitis. Electronically signed by: Greig Pique MD 07/08/2024 04:32 PM EST RP Workstation: [id number]   CT ABDOMEN PELVIS W CONTRAST Result Date: 07/07/2024 CLINICAL DATA:  Iron -deficiency anemia. Gastric ulcer. Chronic constipation. EXAM: CT ABDOMEN AND PELVIS WITH CONTRAST TECHNIQUE: Multidetector CT imaging of the abdomen and pelvis was performed using the standard protocol following bolus administration of intravenous contrast. RADIATION DOSE REDUCTION: This exam was performed according to the departmental dose-optimization program which includes automated exposure control, adjustment of the mA and/or kV according to patient size and/or use of iterative reconstruction technique. CONTRAST:  OMNIPAQUE  IOHEXOL  300 MG/ML  SOLN COMPARISON:  12/14/2020  FINDINGS: Lower Chest: No acute findings. Hepatobiliary: No suspicious hepatic masses identified. Stable small cyst in the central right lobe adjacent to the IVC. Gallbladder is unremarkable. No evidence of biliary ductal dilatation. Pancreas:  No mass or inflammatory changes. Spleen: Within normal limits in size and appearance. Adrenals/Urinary Tract: Stable small left adrenal mass, consistent with benign adenoma. No renal masses identified. No evidence of ureteral calculi or hydronephrosis. Unremarkable unopacified urinary bladder. Stomach/Bowel: Moderate size hiatal hernia again seen. No masses identified. No evidence of obstruction, inflammatory process or abnormal fluid collections. Normal appendix visualized. Diverticulosis is seen mainly involving the sigmoid colon, however there is no evidence of diverticulitis. Vascular/Lymphatic: No pathologically enlarged lymph nodes. No acute vascular findings. Reproductive: Prior hysterectomy noted. Adnexal regions are unremarkable in appearance. Other:  None. Musculoskeletal: No suspicious bone lesions identified. Left hip prosthesis is seen. Neurostimulator device noted with leads in the thoracic spinal canal. IMPRESSION: No acute findings. Colonic diverticulosis, without radiographic evidence of diverticulitis. Moderate hiatal hernia. Electronically Signed   By: Norleen DELENA Kil M.D.   On: 07/07/2024 12:37    Lab Results: Personally reviewed CBC    Component Value Date/Time   WBC 5.6 07/14/2024 0742   RBC 4.38 07/14/2024 0742   HGB 9.1 (L) 07/14/2024 0742   HCT 32.2 (L) 07/14/2024 0742   PLT 263 07/14/2024 0742   MCV 73.5 (L) 07/14/2024 0742   MCH 20.8 (L) 07/14/2024 0742   MCHC 28.3 (L) 07/14/2024 0742   RDW 21.2 (H) 07/14/2024 0742   LYMPHSABS 0.5 (L) 07/09/2024 1122   MONOABS 0.9 07/09/2024 1122   EOSABS 0.0 07/09/2024 1122   BASOSABS 0.0 07/09/2024 1122    BMET    Component Value Date/Time   NA 142 07/16/2024 0416   NA 138 08/03/2021 1031    K 3.7 07/16/2024 0416   CL 94 (L) 07/16/2024 0416   CO2 42 (H) 07/16/2024 0416   GLUCOSE 87 07/16/2024 0416   BUN 17 07/16/2024 0416   BUN 13 08/03/2021 1031   CREATININE 0.51 07/16/2024 0416   CALCIUM  9.2 07/16/2024 0416   GFRNONAA >60 07/16/2024 0416    BNP    Component Value Date/Time   BNP 191.8 (H) 07/09/2024 1122    ProBNP No results found for: PROBNP  Specialty Problems       Pulmonary Problems   Sleep apnea   CPAP      Acute hypoxic respiratory failure (HCC)   Influenza A with pneumonia    Allergies[1]  Immunization History  Administered Date(s) Administered  PFIZER(Purple Top)SARS-COV-2 Vaccination 08/12/2019, 09/01/2019, 04/28/2020    Past Medical History:  Diagnosis Date   Anemia    Atrophic vaginitis 12/26/2019   Formatting of this note might be different from the original. Follows along with urology uses bioidentical hormone   Benign essential hypertension 10/08/2015   Chest pain in adult 07/02/2015   Formatting of this note might be different from the original. 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 L3/4 7.2 mm with L4/5 mod to severe spinal stenosis, L5/S1 with slight disc on the right Formatting of this note might be different from the original. Overview:  L3/4 7.2 mm with L4/5 mod to severe spinal stenosis, L5/S1 with slight disc on the right   Valvular heart disease 07/15/2021    Tobacco History: Tobacco Use History[2] Counseling given: Not Answered   Continue to not smoke  Outpatient Encounter Medications as of 07/30/2024  Medication Sig   albuterol  (VENTOLIN  HFA) 108 (90 Base) MCG/ACT inhaler Inhale 2 puffs into the lungs every 4 (four) hours as needed for wheezing or shortness of breath.   ALPRAZolam  (XANAX ) 0.5 MG tablet Take 0.5 mg by mouth 2 (two) times daily as needed for anxiety.   D3 MAXIMUM STRENGTH 125 MCG (5000 UT) capsule Take 5,000 Units by mouth daily.   EPINEPHrine  0.3 mg/0.3 mL IJ SOAJ injection Inject 0.3 mg into the muscle as needed for anaphylaxis.   FLUoxetine  (PROZAC ) 20 MG capsule Take 20 mg by mouth daily.   fluticasone -salmeterol (ADVAIR) 250-50 MCG/ACT AEPB Inhale 1 puff into the lungs in the morning and at bedtime.   furosemide  (LASIX ) 40 MG tablet Take 1 tablet (40 mg total) by mouth daily as needed for edema. Or 2 lb weight gain   latanoprost  (XALATAN ) 0.005 % ophthalmic solution Place 1 drop into both eyes at bedtime.   levothyroxine  (SYNTHROID ) 88 MCG tablet Take 88 mcg by mouth daily before breakfast.   oxyCODONE  (OXY IR/ROXICODONE ) 5 MG immediate release tablet Take 1-2 tablets (5-10 mg total) by mouth every 4 (four) hours as needed for moderate pain (pain score 4-6).   pantoprazole  (PROTONIX ) 40 MG tablet Take 1 tablet (40 mg total) by mouth 2 (two) times daily.   pramipexole  (MIRAPEX ) 1 MG tablet Take 2 mg by mouth 3 (three) times daily.   Spacer/Aero-Holding  Chambers (COMPACT SPACE CHAMBER) DEVI Use  with the albuterol  inhaler   SUMAtriptan  (IMITREX ) 100 MG tablet Take 100 mg by mouth every 2 (two) hours as needed for migraine. May repeat in 2 hours if headache persists or recurs.   promethazine -dextromethorphan  (PROMETHAZINE -DM) 6.25-15 MG/5ML syrup Take 5 mLs by mouth 4 (four) times daily as needed for cough. Do not use and drive - May make drowsy. (Patient not taking: Reported on 07/30/2024)   sucralfate  (CARAFATE ) 1 g tablet Take 1 g by mouth 4 (four) times daily. (Patient not taking: Reported on 07/30/2024)   Facility-Administered Encounter Medications as of 07/30/2024  Medication   0.9 %  sodium chloride  infusion     Review of Systems  Review of Systems  Comprehensive review of systems otherwise negative unless mentioned in HPI above. Physical Exam  BP (!) 142/81   Pulse 74   Ht 5' 6.5 (1.689 m) Comment: per pt  Wt 254 lb 3.2 oz (115.3 kg)   SpO2 96%   BMI 40.41 kg/m   Wt Readings from Last 5 Encounters:  07/30/24 254 lb 3.2 oz (115.3 kg)  07/16/24 238 lb 1.6 oz (108 kg)  06/29/24 257 lb 3.2 oz (116.7 kg)  06/28/24 257 lb 2 oz (116.6 kg)  02/07/24 247 lb 9.6 oz (112.3 kg)    BMI Readings from Last 5 Encounters:  07/30/24 40.41 kg/m  07/16/24 38.43 kg/m  06/29/24 41.51 kg/m  06/28/24 41.50 kg/m  02/07/24 39.96 kg/m     Physical Exam General: Sitting in chair, no distress Eyes: EOMI Neck: Supple, no JVP Pulmonary: Clear, good air excursion, no wheeze Cardiovascular: Regular rate and rhythm, no murmur appreciated Abdomen: Nondistended    Assessment & Plan:   History of asthma: Likely exacerbated with recent severe viral infection, COVID and flu at the same time.  Excursion is good, no wheeze currently.  With occasional chest tightness shortness of breath.  Prescription for Advair mid dose Diskus 1 puff twice daily, advised use of albuterol  as well.  Already has at home.  Acute hypoxemic respiratory failure: In  setting of viral pneumonia.  With chest x-ray infiltrates.  Has improved significantly.  Oxygen saturation is 96% today on walking into the room.  At home at rest it stayed in the mid to high 90s.  With exertion sats dropped as low as 87%.  Discussed goal 88% using oxygen with exertion until then.  Discussed continuing checking oxygen and if staying above 88% with exertion with no oxygen can discontinue in the future.  Anemia: Could be contributing to his lingering symptoms of shortness of breath. With peptic ulcer disease on EGD.  Avoid NSAIDs.  Continue GI follow-up.   Return in about 3 months (around 10/28/2024) for f/u Dr. Annella.   Donnice JONELLE Annella, MD 07/30/2024   This appointment required 61 minutes of patient care (this includes precharting, chart review, review of results, face-to-face care, etc.).     [1]  Allergies Allergen Reactions   Bee Venom Anaphylaxis   Feldene [Piroxicam] Other (See Comments)    unknown   Naphazoline Other (See Comments)    Other Reaction(s): Not available     Cephalosporins Rash   Flexeril [Cyclobenzaprine] Nausea Only   Keflex [Cephalexin] Rash  [2]  Social History Tobacco Use  Smoking Status Former  Smokeless Tobacco Never   "

## 2024-07-30 NOTE — Patient Instructions (Signed)
 It is nice to meet you  I am glad you are improving  I anticipate additional improvement  Continue to check your oxygen particular with exertion.  At rest it sounds like these are fine.  Goal oxygen saturation is 88%.  Consider using with exertion if it is dropping below 88%.  To help with some of the shortness of breath is ongoing, which is common after viral infections as severe as yours, I think inhalers could be helpful.  I send you.  For Advair, 1 puff twice a day.  Rinse your mouth out thoroughly with water  after every use.  If this is too expensive or causes side effects or adverse reaction, okay to stop this and use albuterol  that you have at home.  2 puffs every 4 hours as needed for shortness of breath or chest tightness or wheeze.  Return to clinic in 3 months or sooner as needed with Dr. Annella, we will see how you are doing, if the inhaler is of help, and repeat a chest x-ray at that time to establish a new baseline

## 2024-08-01 ENCOUNTER — Other Ambulatory Visit: Payer: Self-pay | Admitting: Hematology and Oncology

## 2024-08-01 DIAGNOSIS — D649 Anemia, unspecified: Secondary | ICD-10-CM

## 2024-08-02 ENCOUNTER — Other Ambulatory Visit: Payer: Self-pay | Admitting: Hematology and Oncology

## 2024-08-02 ENCOUNTER — Inpatient Hospital Stay

## 2024-08-02 ENCOUNTER — Inpatient Hospital Stay: Attending: Hematology and Oncology | Admitting: Hematology and Oncology

## 2024-08-02 ENCOUNTER — Encounter: Payer: Self-pay | Admitting: Hematology and Oncology

## 2024-08-02 ENCOUNTER — Other Ambulatory Visit: Payer: Self-pay

## 2024-08-02 DIAGNOSIS — F331 Major depressive disorder, recurrent, moderate: Secondary | ICD-10-CM | POA: Diagnosis not present

## 2024-08-02 DIAGNOSIS — G4730 Sleep apnea, unspecified: Secondary | ICD-10-CM | POA: Diagnosis not present

## 2024-08-02 DIAGNOSIS — E039 Hypothyroidism, unspecified: Secondary | ICD-10-CM | POA: Diagnosis not present

## 2024-08-02 DIAGNOSIS — Z79899 Other long term (current) drug therapy: Secondary | ICD-10-CM | POA: Diagnosis not present

## 2024-08-02 DIAGNOSIS — Z79890 Hormone replacement therapy (postmenopausal): Secondary | ICD-10-CM | POA: Diagnosis not present

## 2024-08-02 DIAGNOSIS — I10 Essential (primary) hypertension: Secondary | ICD-10-CM | POA: Insufficient documentation

## 2024-08-02 DIAGNOSIS — G2581 Restless legs syndrome: Secondary | ICD-10-CM | POA: Diagnosis not present

## 2024-08-02 DIAGNOSIS — Z87891 Personal history of nicotine dependence: Secondary | ICD-10-CM | POA: Insufficient documentation

## 2024-08-02 DIAGNOSIS — E782 Mixed hyperlipidemia: Secondary | ICD-10-CM | POA: Insufficient documentation

## 2024-08-02 DIAGNOSIS — Z809 Family history of malignant neoplasm, unspecified: Secondary | ICD-10-CM | POA: Insufficient documentation

## 2024-08-02 DIAGNOSIS — Z7951 Long term (current) use of inhaled steroids: Secondary | ICD-10-CM | POA: Insufficient documentation

## 2024-08-02 DIAGNOSIS — D649 Anemia, unspecified: Secondary | ICD-10-CM

## 2024-08-02 DIAGNOSIS — D509 Iron deficiency anemia, unspecified: Secondary | ICD-10-CM | POA: Insufficient documentation

## 2024-08-02 DIAGNOSIS — E279 Disorder of adrenal gland, unspecified: Secondary | ICD-10-CM | POA: Diagnosis not present

## 2024-08-02 LAB — CMP (CANCER CENTER ONLY)
ALT: 8 U/L (ref 0–44)
AST: 20 U/L (ref 15–41)
Albumin: 4.1 g/dL (ref 3.5–5.0)
Alkaline Phosphatase: 95 U/L (ref 38–126)
Anion gap: 14 (ref 5–15)
BUN: 11 mg/dL (ref 8–23)
CO2: 24 mmol/L (ref 22–32)
Calcium: 9.9 mg/dL (ref 8.9–10.3)
Chloride: 100 mmol/L (ref 98–111)
Creatinine: 0.62 mg/dL (ref 0.44–1.00)
GFR, Estimated: >60 mL/min
Glucose, Bld: 122 mg/dL — ABNORMAL HIGH (ref 70–99)
Potassium: 3.7 mmol/L (ref 3.5–5.1)
Sodium: 138 mmol/L (ref 135–145)
Total Bilirubin: 0.4 mg/dL (ref 0.0–1.2)
Total Protein: 6.8 g/dL (ref 6.5–8.1)

## 2024-08-02 LAB — CBC WITH DIFFERENTIAL (CANCER CENTER ONLY)
Abs Immature Granulocytes: 0.06 K/uL (ref 0.00–0.07)
Basophils Absolute: 0 K/uL (ref 0.0–0.1)
Basophils Relative: 0 %
Eosinophils Absolute: 0.2 K/uL (ref 0.0–0.5)
Eosinophils Relative: 2 %
HCT: 30.7 % — ABNORMAL LOW (ref 36.0–46.0)
Hemoglobin: 9 g/dL — ABNORMAL LOW (ref 12.0–15.0)
Immature Granulocytes: 1 %
Lymphocytes Relative: 17 %
Lymphs Abs: 1.3 K/uL (ref 0.7–4.0)
MCH: 20.6 pg — ABNORMAL LOW (ref 26.0–34.0)
MCHC: 29.3 g/dL — ABNORMAL LOW (ref 30.0–36.0)
MCV: 70.4 fL — ABNORMAL LOW (ref 80.0–100.0)
Monocytes Absolute: 0.5 K/uL (ref 0.1–1.0)
Monocytes Relative: 7 %
Neutro Abs: 5.7 K/uL (ref 1.7–7.7)
Neutrophils Relative %: 73 %
Platelet Count: 323 K/uL (ref 150–400)
RBC: 4.36 MIL/uL (ref 3.87–5.11)
RDW: 23.9 % — ABNORMAL HIGH (ref 11.5–15.5)
WBC Count: 7.8 K/uL (ref 4.0–10.5)
nRBC: 0 % (ref 0.0–0.2)

## 2024-08-02 LAB — FERRITIN: Ferritin: 19 ng/mL (ref 11–307)

## 2024-08-02 LAB — IRON AND TIBC
Iron: 15 ug/dL — ABNORMAL LOW (ref 28–170)
Saturation Ratios: 3 % — ABNORMAL LOW (ref 10.4–31.8)
TIBC: 503 ug/dL — ABNORMAL HIGH (ref 250–450)
UIBC: 487 ug/dL

## 2024-08-02 LAB — TSH: TSH: 1.16 u[IU]/mL (ref 0.350–4.500)

## 2024-08-02 LAB — VITAMIN B12: Vitamin B-12: 376 pg/mL (ref 180–914)

## 2024-08-02 LAB — FOLATE: Folate: 11.5 ng/mL

## 2024-08-02 NOTE — Progress Notes (Signed)
 [name]  Patient Care Team: Benson Kendra Rung, NP as PCP - General (Internal Medicine)  ASSESSMENT & PLAN:  Anemia: Most recently hospitalized with COVID and flu. During this hospitalization, she did receive blood products.Hemoglobin today is 9.0. She remains symptomatic with shortness of breath and fatigue. Prior to hospitalization, she was evaluated by Dr. Charlanne with endoscopy and was found to have gastric ulcers. We discussed iron  infusions including potential infusion reactions. She is agreeable to this plan and we will proceed with infusion tomorrow. She will return to clinic in 4 weeks for repeat evaluation.   All questions were answered. The patient knows to call the clinic with any problems, questions or concerns.  The total time spent in the appointment was 45 minutes encounter with patients including review of chart and various tests results, discussions about plan of care and coordination of care plan  Kendra DELENA Bach, NP 1/8/20262:18 PM   CHIEF COMPLAINTS/PURPOSE OF CONSULTATION:  Anemia  HISTORY OF PRESENTING ILLNESS:  Kendra [name] 79 y.o. female is here because of anemia  She was found to have abnormal CBC from 08/02/2024 She denies recent chest pain on exertion, pre-syncopal episodes, or palpitations.Complains of shortness of breath.  She had not noticed any recent bleeding such as epistaxis, hematuria or hematochezia The patient denies over the counter NSAID ingestion. She is not  on antiplatelets agents. Her last colonoscopy was a few months ago. She had no prior history or diagnosis of cancer. Her age appropriate screening programs are up-to-date. She denies any pica and eats a variety of diet. She  received blood transfusion The patient was not  prescribed oral iron  supplements  MEDICAL HISTORY:  Past Medical History:  Diagnosis Date   Anemia    Atrophic vaginitis 12/26/2019   Formatting of this note might be  different from the original. Follows along with urology uses bioidentical hormone   Benign essential hypertension 10/08/2015   Chest pain in adult 07/02/2015   Formatting of this note might be different from the original. 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 L3/4 7.2 mm with L4/5 mod to severe spinal stenosis, L5/S1 with slight disc on the right Formatting of this note might be different from the original. 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   Procedure: TOTAL HIP ARTHROPLASTY ANTERIOR APPROACH;  Surgeon: Ernie Cough, MD;  Location: [facility] ORS;  Service: Orthopedics;  Laterality: Left;   TOTAL HIP ARTHROPLASTY Left 03/20/2023   Procedure: TOTAL HIP ARTHROPLASTY ANTERIOR APPROACH;  Surgeon: Ernie Cough, MD;  Location: [facility] ORS;  Service: Orthopedics;  Laterality: Left;    SOCIAL HISTORY: Social History   Socioeconomic History   Marital status: Widowed    Spouse name: Not on file   Number of children: Not on file   Years of education: Not on file   Highest education level: Not on file  Occupational History   Not on file  Tobacco Use   Smoking status: Former   Smokeless tobacco: Never  Vaping Use   Vaping status: Never Used  Substance and Sexual Activity   Alcohol use: Yes    Alcohol/week: 6.0 standard drinks of alcohol    Types: 2 Glasses of wine, 2 Cans of beer, 2 Shots of liquor per week    Comment: 2 a day   Drug use: Never   Sexual activity: Not on file  Other Topics Concern   Not on file  Social History Narrative   Not on file   Social Drivers of Health   Tobacco Use: Medium Risk (08/02/2024)   Patient History    Smoking Tobacco Use: Former    Smokeless Tobacco Use: Never    Passive Exposure: Not on Actuary Strain: Not on file  Food Insecurity: No Food Insecurity (08/01/2024)   Epic    Worried About Programme Researcher, Broadcasting/film/video in the Last Year: Never true    Ran Out of Food in the Last Year: Never true  Transportation Needs: No Transportation Needs (08/01/2024)   Epic    Lack of Transportation (Medical): No    Lack of Transportation (Non-Medical): No  Physical Activity: Not on file  Stress: Not on file  Social Connections: Moderately Integrated (07/10/2024)   Social Connection and Isolation Panel    Frequency of Communication with Friends and Family: Twice a week    Frequency of Social  Gatherings with Friends and Family: Three times a week    Attends Religious Services: 1 to 4 times per year    Active Member of Clubs or Organizations: No    Attends Banker Meetings: Never    Marital Status: Married  Catering Manager Violence: Not At Risk (07/10/2024)   Epic    Fear of Current or Ex-Partner: No    Emotionally Abused: No    Physically Abused: No    Sexually Abused: No  Depression (PHQ2-9): Not on file  Alcohol Screen: Not on file  Housing: Low Risk (08/01/2024)   Epic    Unable to Pay for Housing in the Last Year: No    Number of Times Moved in the Last Year: 0    Homeless in the Last Year: No  Utilities: Not At Risk (08/01/2024)   Epic  Threatened with loss of utilities: No  Health Literacy: Not on file    FAMILY HISTORY: Family History  Problem Relation Age of Onset   Cancer - Prostate Father    Esophageal cancer Neg Hx    Colon cancer Neg Hx    Rectal cancer Neg Hx    Stomach cancer Neg Hx     ALLERGIES:  is allergic to bee venom, feldene [piroxicam], naphazoline, cephalosporins, flexeril [cyclobenzaprine], and keflex [cephalexin].  MEDICATIONS:  Current Outpatient Medications  Medication Sig Dispense Refill   albuterol  (VENTOLIN  HFA) 108 (90 Base) MCG/ACT inhaler Inhale 2 puffs into the lungs every 4 (four) hours as needed for wheezing or shortness of breath. 6.7 g 0   ALPRAZolam  (XANAX ) 0.5 MG tablet Take 0.5 mg by mouth 2 (two) times daily as needed for anxiety.     D3 MAXIMUM STRENGTH 125 MCG (5000 UT) capsule Take 5,000 Units by mouth daily.     EPINEPHrine  0.3 mg/0.3 mL IJ SOAJ injection Inject 0.3 mg into the muscle as needed for anaphylaxis.     FLUoxetine  (PROZAC ) 20 MG capsule Take 20 mg by mouth daily.     fluticasone -salmeterol (ADVAIR) 250-50 MCG/ACT AEPB Inhale 1 puff into the lungs in the morning and at bedtime. 60 each 3   furosemide  (LASIX ) 40 MG tablet Take 1 tablet (40 mg total) by mouth daily as needed for edema. Or 2 lb  weight gain 30 tablet 1   latanoprost  (XALATAN ) 0.005 % ophthalmic solution Place 1 drop into both eyes at bedtime.     levothyroxine  (SYNTHROID ) 88 MCG tablet Take 88 mcg by mouth daily before breakfast.     oxyCODONE  (OXY IR/ROXICODONE ) 5 MG immediate release tablet Take 1-2 tablets (5-10 mg total) by mouth every 4 (four) hours as needed for moderate pain (pain score 4-6). 30 tablet 0   pantoprazole  (PROTONIX ) 40 MG tablet Take 1 tablet (40 mg total) by mouth 2 (two) times daily. 60 tablet 4   pramipexole  (MIRAPEX ) 1 MG tablet Take 2 mg by mouth 3 (three) times daily.     Spacer/Aero-Holding Chambers (COMPACT SPACE CHAMBER) DEVI Use with the albuterol  inhaler 1 each 0   SUMAtriptan  (IMITREX ) 100 MG tablet Take 100 mg by mouth every 2 (two) hours as needed for migraine. May repeat in 2 hours if headache persists or recurs.     Current Facility-Administered Medications  Medication Dose Route Frequency Provider Last Rate Last Admin   0.9 %  sodium chloride  infusion  500 mL Intravenous Once Kendra [name] Groom, MD        REVIEW OF SYSTEMS:   Constitutional: Denies fevers, chills or abnormal night sweats Eyes: Denies blurriness of vision, double vision or watery eyes Ears, nose, mouth, throat, and face: Denies mucositis or sore throat Respiratory: Denies cough, dyspnea or wheezes Cardiovascular: Denies palpitation, chest discomfort or lower extremity swelling Gastrointestinal:  Denies nausea, heartburn or change in bowel habits Skin: Denies abnormal skin rashes Lymphatics: Denies new lymphadenopathy or easy bruising Neurological:Denies numbness, tingling or new weaknesses Behavioral/Psych: Mood is stable, no new changes  All other systems were reviewed with the patient and are negative.  PHYSICAL EXAMINATION: ECOG PERFORMANCE STATUS: 1 - Symptomatic but completely ambulatory  Vitals:   08/02/24 1401  BP: 110/67  Pulse: 94  Resp: (!) 22  Temp: 99.1 F (37.3 C)  SpO2: 93%   Filed Weights    08/02/24 1401  Weight: 248 lb 12.8 oz (112.9 kg)    GENERAL:alert, no distress and comfortable  SKIN: skin color, texture, turgor are normal, no rashes or significant lesions EYES: normal, conjunctiva are pink and non-injected, sclera clear OROPHARYNX:no exudate, no erythema and lips, buccal mucosa, and tongue normal  NECK: supple, thyroid  normal size, non-tender, without nodularity LYMPH:  no palpable lymphadenopathy in the cervical, axillary or inguinal LUNGS: clear to auscultation and percussion with normal breathing effort HEART: regular rate & rhythm and no murmurs and no lower extremity edema ABDOMEN:abdomen soft, non-tender and normal bowel sounds Musculoskeletal:no cyanosis of digits and no clubbing  PSYCH: alert & oriented x 3 with fluent speech NEURO: no focal motor/sensory deficits  RADIOGRAPHIC STUDIES: I have personally reviewed the radiological images as listed and agreed with the findings in the report. ECHOCARDIOGRAM COMPLETE Result Date: 07/15/2024    ECHOCARDIOGRAM REPORT   Patient Name:   LAHNA NATH Date of Exam: 07/15/2024 Medical Rec #:  [id number]      Height:       66.0 in Accession #:    [id number]     Weight:       246.9 lb Date of Birth:  29-Oct-1945      BSA:          2.187 m Patient Age:    78 years       BP:           138/89 mmHg Patient Gender: F              HR:           56 bpm. Exam Location:  Inpatient Procedure: 2D Echo, Cardiac Doppler and Color Doppler (Both Spectral and Color            Flow Doppler were utilized during procedure). Indications:    Acute Respiratory Distres R06.03  History:        Patient has prior history of Echocardiogram examinations, most                 recent 07/14/2021. Risk Factors:Hypertension.  Sonographer:    Nathanel Devonshire Referring Phys: LEOMA DONALDA M GHIMIRE IMPRESSIONS  1. Left ventricular ejection fraction, by estimation, is 55 to 60%. The left ventricle has normal function. The left ventricle has no regional wall motion  abnormalities. There is mild concentric left ventricular hypertrophy. Left ventricular diastolic parameters are indeterminate.  2. Right ventricular systolic function is normal. The right ventricular size is normal.  3. Left atrial size was mildly dilated.  4. Small mobile mass on the posterior mitral leaflet consistent with degenerative calc. The mitral valve is degenerative. Trivial mitral valve regurgitation. No evidence of mitral stenosis.  5. The aortic valve is normal in structure. Aortic valve regurgitation is mild. Aortic valve sclerosis/calcification is present, without any evidence of aortic stenosis.  6. The inferior vena cava is normal in size with greater than 50% respiratory variability, suggesting right atrial pressure of 3 mmHg. FINDINGS  Left Ventricle: Left ventricular ejection fraction, by estimation, is 55 to 60%. The left ventricle has normal function. The left ventricle has no regional wall motion abnormalities. The left ventricular internal cavity size was normal in size. There is  mild concentric left ventricular hypertrophy. Left ventricular diastolic parameters are indeterminate. Right Ventricle: The right ventricular size is normal. No increase in right ventricular wall thickness. Right ventricular systolic function is normal. Left Atrium: Left atrial size was mildly dilated. Right Atrium: Right atrial size was normal in size. Pericardium: Trivial pericardial effusion is present. The pericardial effusion is anterior to the right ventricle. Presence  of epicardial fat layer. Mitral Valve: Small mobile mass on the posterior mitral leaflet consistent with degenerative calc. The mitral valve is degenerative in appearance. Trivial mitral valve regurgitation. No evidence of mitral valve stenosis. Tricuspid Valve: The tricuspid valve is normal in structure. Tricuspid valve regurgitation is not demonstrated. No evidence of tricuspid stenosis. Aortic Valve: The aortic valve is normal in structure.  Aortic valve regurgitation is mild. Aortic valve sclerosis/calcification is present, without any evidence of aortic stenosis. Aortic valve mean gradient measures 8.0 mmHg. Aortic valve peak gradient measures 15.8 mmHg. Aortic valve area, by VTI measures 2.22 cm. Pulmonic Valve: The pulmonic valve was normal in structure. Pulmonic valve regurgitation is not visualized. No evidence of pulmonic stenosis. Aorta: The aortic root is normal in size and structure. Venous: The inferior vena cava is normal in size with greater than 50% respiratory variability, suggesting right atrial pressure of 3 mmHg. IAS/Shunts: No atrial level shunt detected by color flow Doppler.  LEFT VENTRICLE PLAX 2D LVIDd:         5.40 cm     Diastology LVIDs:         3.90 cm     LV e' medial:    4.24 cm/s LV PW:         1.00 cm     LV E/e' medial:  15.0 LV IVS:        1.20 cm     LV e' lateral:   3.59 cm/s LVOT diam:     2.00 cm     LV E/e' lateral: 17.8 LV SV:         84 LV SV Index:   38 LVOT Area:     3.14 cm  LV Volumes (MOD) LV vol d, MOD A2C: 87.2 ml LV vol d, MOD A4C: 97.6 ml LV vol s, MOD A2C: 35.3 ml LV vol s, MOD A4C: 38.5 ml LV SV MOD A2C:     51.9 ml LV SV MOD A4C:     97.6 ml LV SV MOD BP:      55.4 ml RIGHT VENTRICLE             IVC RV Basal diam:  3.10 cm     IVC diam: 1.70 cm RV S prime:     15.20 cm/s TAPSE (M-mode): 2.5 cm      PULMONARY VEINS                             Diastolic Velocity: 34.30 cm/s                             S/D Velocity:       1.30                             Systolic Velocity:  46.30 cm/s LEFT ATRIUM             Index        RIGHT ATRIUM           Index LA diam:        3.90 cm 1.78 cm/m   RA Area:     15.50 cm LA Vol (A2C):   71.3 ml 32.60 ml/m  RA Volume:   35.50 ml  16.23 ml/m LA Vol (A4C):   80.4 ml 36.76 ml/m LA Biplane Vol: 78.6 ml 35.94 ml/m  AORTIC VALVE AV Area (Vmax):    1.94 cm AV Area (Vmean):   1.77 cm AV Area (VTI):     2.22 cm AV Vmax:           199.00 cm/s AV Vmean:          135.000  cm/s AV VTI:            0.379 m AV Peak Grad:      15.8 mmHg AV Mean Grad:      8.0 mmHg LVOT Vmax:         123.00 cm/s LVOT Vmean:        76.100 cm/s LVOT VTI:          0.268 m LVOT/AV VTI ratio: 0.71  AORTA Ao Root diam: 3.50 cm Ao Asc diam:  3.30 cm MITRAL VALVE MV Area (PHT): 2.49 cm    SHUNTS MV Decel Time: 305 msec    Systemic VTI:  0.27 m MV E velocity: 63.80 cm/s  Systemic Diam: 2.00 cm MV A velocity: 86.10 cm/s MV E/A ratio:  0.74 Kardie Tobb DO Electronically signed by Dub Huntsman DO Signature Date/Time: 07/15/2024/12:29:09 PM    Final    DG CHEST PORT 1 VIEW Result Date: 07/14/2024 EXAM: 1 VIEW(S) XRAY OF THE CHEST 07/14/2024 07:15:00 AM COMPARISON: 07/12/2024 CLINICAL HISTORY: Acute on chronic respiratory failure with hypoxia (HCC) [id number] FINDINGS: LINES, TUBES AND DEVICES: Spinal stimulator noted. LUNGS AND PLEURA: Central pulmonary vascular congestion again noted. Blunting of the right costophrenic sulcus, possibly reflecting small pleural effusion. No focal pulmonary opacity. No pneumothorax. HEART AND MEDIASTINUM: Stable cardiomegaly. BONES AND SOFT TISSUES: No acute osseous abnormality. IMPRESSION: 1. Redemonstrated cardiomegaly with unchanged central pulmonary vascular congestion. 2. Blunting of the right costophrenic sulcus, possibly reflecting a small pleural effusion. Electronically signed by: Rogelia Myers MD 07/14/2024 10:59 AM EST RP Workstation: [id number]   DG CHEST PORT 1 VIEW Result Date: 07/12/2024 EXAM: 1 VIEW(S) XRAY OF THE CHEST 07/12/2024 02:56:00 PM COMPARISON: 07/09/2024 CLINICAL HISTORY: Shortness of breath 10026; 200808 Hypoxia 200808 FINDINGS: LINES, TUBES AND DEVICES: Neurostimulator leads noted overlying the midthoracic spine. LUNGS AND PLEURA: Vascular congestion is noted. Basilar atelectatic changes are seen. Possible trace bilateral pleural effusions. No focal pulmonary opacity. No pneumothorax. HEART AND MEDIASTINUM: Mild cardiomegaly. BONES AND SOFT TISSUES: No  acute osseous abnormality. IMPRESSION: 1. Vascular congestion and mild cardiomegaly. 2. Possible trace bilateral pleural effusions. 3. Basilar atelectatic changes. Electronically signed by: Oneil Devonshire MD 07/12/2024 07:55 PM EST RP Workstation: [id number]   CT Angio Chest PE W and/or Wo Contrast Result Date: 07/09/2024 EXAM: CTA of the Chest with contrast for PE 07/09/2024 01:59:46 PM TECHNIQUE: CTA of the chest was performed without and with the administration of 75 mL of iohexol  (OMNIPAQUE ) 350 MG/ML injection. Multiplanar reformatted images are provided for review. MIP images are provided for review. Automated exposure control, iterative reconstruction, and/or weight based adjustment of the mA/kV was utilized to reduce the radiation dose to as low as reasonably achievable. Mild motion artifact is present. COMPARISON: Chest radiograph 07/09/2024. CLINICAL HISTORY: Pulmonary embolism (PE) suspected, high prob. FINDINGS: PULMONARY ARTERIES: Good opacification of the central and segmental pulmonary arteries. No focal filling defects. No evidence of significant pulmonary embolus. Main pulmonary artery is normal in caliber. MEDIASTINUM: Normal heart size. No pericardial effusions. Normal caliber thoracic aorta. No dissection. Calcification of the aorta and coronary arteries. Moderate-sized esophageal hiatal hernia. The esophagus is decompressed. LYMPH NODES: No significant lymphadenopathy. LUNGS AND PLEURA: Patchy infiltrates demonstrated in  the right upper lung, right middle lung, and left lingula. These may represent multifocal pneumonia or aspiration. There is evidence of opacification in lower lobe bronchi with bronchial wall thickening, possibly mucous plugging or aspiration. No pleural effusion or pneumothorax. UPPER ABDOMEN: 1.6 cm diameter left adrenal gland nodule with attenuation measurements of 2.7 cm. SOFT TISSUES AND BONES: Spinal cord stimulator leads in the thoracic spine. Degenerative changes in the  spine. No acute soft tissue abnormality. IMPRESSION: 1. No evidence of pulmonary embolism. 2. Patchy infiltrates in the right upper lung, right middle lung, and left lingula, possibly representing multifocal pneumonia or aspiration. 3. Findings suggestive of lower lobe airway mucus plugging or aspiration. 4. Moderate-sized esophageal hiatal hernia with decompressed esophagus. 5. Aortic and coronary artery calcifications. Electronically signed by: Elsie Gravely MD 07/09/2024 02:17 PM EST RP Workstation: [id number]   DG Chest 1 View Result Date: 07/09/2024 CLINICAL DATA:  Sepsis. EXAM: DG CHEST 1V COMPARISON:  Chest radiograph dated 07/08/2024 FINDINGS: No focal consolidation, pleural effusion or pneumothorax. There is mild cardiomegaly with mild vascular congestion. Moderate size hiatal hernia. Spinal stimulator. No acute osseous pathology. IMPRESSION: 1. Mild cardiomegaly with mild vascular congestion. No focal consolidation. 2. Moderate size hiatal hernia. Electronically Signed   By: Vanetta Chou M.D.   On: 07/09/2024 10:55   DG Chest 2 View Result Date: 07/08/2024 EXAM: 2 VIEW(S) XRAY OF THE CHEST 07/08/2024 04:05:00 PM COMPARISON: None available. CLINICAL HISTORY: cough FINDINGS: LINES, TUBES AND DEVICES: Spinal stimulator in place. LUNGS AND PLEURA: Hazy interstitial opacity at right lung base. No pleural effusion. No pneumothorax. HEART AND MEDIASTINUM: No acute abnormality of the cardiac and mediastinal silhouettes. BONES AND SOFT TISSUES: No acute osseous abnormality. IMPRESSION: 1. Hazy interstitial opacity at the right lung base, which may reflect atelectasis versus early pneumonitis. Electronically signed by: Greig Pique MD 07/08/2024 04:32 PM EST RP Workstation: [id number]   CT ABDOMEN PELVIS W CONTRAST Result Date: 07/07/2024 CLINICAL DATA:  Iron -deficiency anemia. Gastric ulcer. Chronic constipation. EXAM: CT ABDOMEN AND PELVIS WITH CONTRAST TECHNIQUE: Multidetector CT imaging of the  abdomen and pelvis was performed using the standard protocol following bolus administration of intravenous contrast. RADIATION DOSE REDUCTION: This exam was performed according to the departmental dose-optimization program which includes automated exposure control, adjustment of the mA and/or kV according to patient size and/or use of iterative reconstruction technique. CONTRAST:  OMNIPAQUE  IOHEXOL  300 MG/ML  SOLN COMPARISON:  12/14/2020 FINDINGS: Lower Chest: No acute findings. Hepatobiliary: No suspicious hepatic masses identified. Stable small cyst in the central right lobe adjacent to the IVC. Gallbladder is unremarkable. No evidence of biliary ductal dilatation. Pancreas:  No mass or inflammatory changes. Spleen: Within normal limits in size and appearance. Adrenals/Urinary Tract: Stable small left adrenal mass, consistent with benign adenoma. No renal masses identified. No evidence of ureteral calculi or hydronephrosis. Unremarkable unopacified urinary bladder. Stomach/Bowel: Moderate size hiatal hernia again seen. No masses identified. No evidence of obstruction, inflammatory process or abnormal fluid collections. Normal appendix visualized. Diverticulosis is seen mainly involving the sigmoid colon, however there is no evidence of diverticulitis. Vascular/Lymphatic: No pathologically enlarged lymph nodes. No acute vascular findings. Reproductive: Prior hysterectomy noted. Adnexal regions are unremarkable in appearance. Other:  None. Musculoskeletal: No suspicious bone lesions identified. Left hip prosthesis is seen. Neurostimulator device noted with leads in the thoracic spinal canal. IMPRESSION: No acute findings. Colonic diverticulosis, without radiographic evidence of diverticulitis. Moderate hiatal hernia. Electronically Signed   By: Norleen DELENA Kil M.D.   On: 07/07/2024 12:37

## 2024-08-03 ENCOUNTER — Inpatient Hospital Stay

## 2024-08-03 VITALS — BP 135/78 | HR 71 | Temp 98.9°F | Resp 20

## 2024-08-03 DIAGNOSIS — D649 Anemia, unspecified: Secondary | ICD-10-CM

## 2024-08-03 DIAGNOSIS — D509 Iron deficiency anemia, unspecified: Secondary | ICD-10-CM | POA: Diagnosis not present

## 2024-08-03 MED ORDER — SODIUM CHLORIDE 0.9 % IV SOLN
1000.0000 mg | Freq: Once | INTRAVENOUS | Status: AC
  Administered 2024-08-03: 1000 mg via INTRAVENOUS
  Filled 2024-08-03: qty 1000

## 2024-08-03 MED ORDER — SODIUM CHLORIDE 0.9 % IV SOLN: INTRAVENOUS | Status: DC

## 2024-08-03 MED ORDER — LORATADINE 10 MG PO TABS
10.0000 mg | ORAL_TABLET | Freq: Every day | ORAL | Status: DC
  Administered 2024-08-03: 10 mg via ORAL
  Filled 2024-08-03: qty 1

## 2024-08-03 MED ORDER — ACETAMINOPHEN 325 MG PO TABS
650.0000 mg | ORAL_TABLET | Freq: Once | ORAL | Status: AC
  Administered 2024-08-03: 650 mg via ORAL
  Filled 2024-08-03: qty 2

## 2024-08-03 NOTE — Patient Instructions (Signed)

## 2024-08-06 LAB — MULTIPLE MYELOMA PANEL, SERUM
Albumin SerPl Elph-Mcnc: 3.5 g/dL (ref 2.9–4.4)
Albumin/Glob SerPl: 1.3 (ref 0.7–1.7)
Alpha 1: 0.3 g/dL (ref 0.0–0.4)
Alpha2 Glob SerPl Elph-Mcnc: 0.9 g/dL (ref 0.4–1.0)
B-Globulin SerPl Elph-Mcnc: 1.1 g/dL (ref 0.7–1.3)
Gamma Glob SerPl Elph-Mcnc: 0.7 g/dL (ref 0.4–1.8)
Globulin, Total: 2.9 g/dL (ref 2.2–3.9)
IgA: 276 mg/dL (ref 64–422)
IgG (Immunoglobin G), Serum: 686 mg/dL (ref 586–1602)
IgM (Immunoglobulin M), Srm: 81 mg/dL (ref 26–217)
Total Protein ELP: 6.4 g/dL (ref 6.0–8.5)

## 2024-08-24 ENCOUNTER — Encounter: Payer: Self-pay | Admitting: Gastroenterology

## 2024-08-30 ENCOUNTER — Encounter: Payer: Self-pay | Admitting: Hematology and Oncology

## 2024-08-30 ENCOUNTER — Inpatient Hospital Stay

## 2024-08-30 ENCOUNTER — Telehealth: Payer: Self-pay | Admitting: Hematology and Oncology

## 2024-08-30 ENCOUNTER — Inpatient Hospital Stay: Attending: Hematology and Oncology | Admitting: Hematology and Oncology

## 2024-08-30 DIAGNOSIS — D649 Anemia, unspecified: Secondary | ICD-10-CM

## 2024-08-30 LAB — CBC WITH DIFFERENTIAL (CANCER CENTER ONLY)
Abs Immature Granulocytes: 0.09 10*3/uL — ABNORMAL HIGH (ref 0.00–0.07)
Basophils Absolute: 0 10*3/uL (ref 0.0–0.1)
Basophils Relative: 0 %
Eosinophils Absolute: 0 10*3/uL (ref 0.0–0.5)
Eosinophils Relative: 0 %
HCT: 38.7 % (ref 36.0–46.0)
Hemoglobin: 11.8 g/dL — ABNORMAL LOW (ref 12.0–15.0)
Immature Granulocytes: 1 %
Lymphocytes Relative: 7 %
Lymphs Abs: 0.8 10*3/uL (ref 0.7–4.0)
MCH: 24.8 pg — ABNORMAL LOW (ref 26.0–34.0)
MCHC: 30.5 g/dL (ref 30.0–36.0)
MCV: 81.3 fL (ref 80.0–100.0)
Monocytes Absolute: 0.7 10*3/uL (ref 0.1–1.0)
Monocytes Relative: 6 %
Neutro Abs: 10.1 10*3/uL — ABNORMAL HIGH (ref 1.7–7.7)
Neutrophils Relative %: 86 %
Platelet Count: 310 10*3/uL (ref 150–400)
RBC: 4.76 MIL/uL (ref 3.87–5.11)
Smear Review: NORMAL
WBC Count: 11.7 10*3/uL — ABNORMAL HIGH (ref 4.0–10.5)
nRBC: 0 % (ref 0.0–0.2)

## 2024-08-30 LAB — CMP (CANCER CENTER ONLY)
ALT: 13 U/L (ref 0–44)
AST: 13 U/L — ABNORMAL LOW (ref 15–41)
Albumin: 4.4 g/dL (ref 3.5–5.0)
Alkaline Phosphatase: 79 U/L (ref 38–126)
Anion gap: 11 (ref 5–15)
BUN: 20 mg/dL (ref 8–23)
CO2: 27 mmol/L (ref 22–32)
Calcium: 9.4 mg/dL (ref 8.9–10.3)
Chloride: 100 mmol/L (ref 98–111)
Creatinine: 0.66 mg/dL (ref 0.44–1.00)
GFR, Estimated: >60 mL/min
Glucose, Bld: 113 mg/dL — ABNORMAL HIGH (ref 70–99)
Potassium: 3.6 mmol/L (ref 3.5–5.1)
Sodium: 138 mmol/L (ref 135–145)
Total Bilirubin: 0.3 mg/dL (ref 0.0–1.2)
Total Protein: 6.9 g/dL (ref 6.5–8.1)

## 2024-08-30 LAB — FOLATE: Folate: 8 ng/mL

## 2024-08-30 LAB — IRON AND TIBC
Iron: 35 ug/dL (ref 28–170)
Saturation Ratios: 8 % — ABNORMAL LOW (ref 10.4–31.8)
TIBC: 465 ug/dL — ABNORMAL HIGH (ref 250–450)
UIBC: 430 ug/dL

## 2024-08-30 LAB — TSH: TSH: 0.797 u[IU]/mL (ref 0.350–4.500)

## 2024-08-30 LAB — FERRITIN: Ferritin: 65 ng/mL (ref 11–307)

## 2024-08-30 LAB — VITAMIN B12: Vitamin B-12: 360 pg/mL (ref 180–914)

## 2024-08-30 NOTE — Telephone Encounter (Signed)
 Scheduled appointments per 2/5 los. Talked with the patient and she is aware of the made appointments.

## 2024-08-30 NOTE — Progress Notes (Unsigned)
 Kendra [name] CONSULT NOTE  Patient Care Team: Kendra Kendra Rung, NP as PCP - General (Internal Medicine)  ASSESSMENT & PLAN:  Anemia: Most recently hospitalized with COVID and flu. During this hospitalization, she did receive blood products.Hemoglobin today is 9.0. She remains symptomatic with shortness of breath and fatigue. Prior to hospitalization, she was evaluated by Dr. Charlanne with endoscopy and was found to have gastric ulcers. We discussed iron  infusions including potential infusion reactions. She is agreeable to this plan and we will proceed with infusion tomorrow. She will return to clinic in 4 weeks for repeat evaluation.   All questions were answered. The patient knows to call the clinic with any problems, questions or concerns.  The total time spent in the appointment was 45 minutes encounter with patients including review of chart and various tests results, discussions about plan of care and coordination of care plan  Kendra DELENA Bach, NP 2/5/20269:36 AM   CHIEF COMPLAINTS/PURPOSE OF CONSULTATION:  Anemia  HISTORY OF PRESENTING ILLNESS:  Kendra [name] 79 y.o. female is here because of anemia  She was found to have abnormal CBC from 08/02/2024 She denies recent chest pain on exertion, pre-syncopal episodes, or palpitations.Complains of shortness of breath.  She had not noticed any recent bleeding such as epistaxis, hematuria or hematochezia The patient denies over the counter NSAID ingestion. She is not  on antiplatelets agents. Her last colonoscopy was a few months ago. She had no prior history or diagnosis of cancer. Her age appropriate screening programs are up-to-date. She denies any pica and eats a variety of diet. She  received blood transfusion The patient was not  prescribed oral iron  supplements  MEDICAL HISTORY:  Past Medical History:  Diagnosis Date   Anemia    Atrophic vaginitis 12/26/2019   Formatting of this note might be  different from the original. Follows along with urology uses bioidentical hormone   Benign essential hypertension 10/08/2015   Chest pain in adult 07/02/2015   Formatting of this note might be different from the original. 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 L3/4 7.2 mm with L4/5 mod to severe spinal stenosis, L5/S1 with slight disc on the right Formatting of this note might be different from the original. 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   Procedure: TOTAL HIP ARTHROPLASTY ANTERIOR APPROACH;  Surgeon: Ernie Cough, MD;  Location: [facility]S;  Service: Orthopedics;  Laterality: Left;   TOTAL HIP ARTHROPLASTY Left 03/20/2023   Procedure: TOTAL HIP ARTHROPLASTY ANTERIOR APPROACH;  Surgeon: Ernie Cough, MD;  Location: [facility];  Service: Orthopedics;  Laterality: Left;    SOCIAL HISTORY: Social History   Socioeconomic History   Marital status: Widowed    Spouse name: Not on file   Number of children: Not on file   Years of education: Not on file   Highest education level: Not on file  Occupational History   Not on file  Tobacco Use   Smoking status: Former   Smokeless tobacco: Never  Vaping Use   Vaping status: Never Used  Substance and Sexual Activity   Alcohol use: Yes    Alcohol/week: 6.0 standard drinks of alcohol    Types: 2 Glasses of wine, 2 Cans of beer, 2 Shots of liquor per week    Comment: 2 a day   Drug use: Never   Sexual activity: Not on file  Other Topics Concern   Not on file  Social History Narrative   Not on file   Social Drivers of Health   Tobacco Use: Medium Risk (08/03/2024)   Patient History    Smoking Tobacco Use: Former    Smokeless Tobacco Use: Never    Passive Exposure: Not on Actuary Strain: Not on file  Food Insecurity: No Food Insecurity (08/01/2024)   Epic    Worried About Programme Researcher, Broadcasting/film/video in the Last Year: Never true    Ran Out of Food in the Last Year: Never true  Transportation Needs: No Transportation Needs (08/01/2024)   Epic    Lack of Transportation (Medical): No    Lack of Transportation (Non-Medical): No  Physical Activity: Not on file  Stress: Not on file  Social Connections: Moderately Integrated (07/10/2024)   Social Connection and Isolation Panel    Frequency of Communication with Friends and Family: Twice a week    Frequency of Social  Gatherings with Friends and Family: Three times a week    Attends Religious Services: 1 to 4 times per year    Active Member of Clubs or Organizations: No    Attends Banker Meetings: Never    Marital Status: Married  Catering Manager Violence: Not At Risk (07/10/2024)   Epic    Fear of Current or Ex-Partner: No    Emotionally Abused: No    Physically Abused: No    Sexually Abused: No  Depression (PHQ2-9): Not on file  Alcohol Screen: Not on file  Housing: Low Risk (08/01/2024)   Epic    Unable to Pay for Housing in the Last Year: No    Number of Times Moved in the Last Year: 0    Homeless in the Last Year: No  Utilities: Not At Risk (08/01/2024)   Epic  Threatened with loss of utilities: No  Health Literacy: Not on file    FAMILY HISTORY: Family History  Problem Relation Age of Onset   Cancer - Prostate Father    Esophageal cancer Neg Hx    Colon cancer Neg Hx    Rectal cancer Neg Hx    Stomach cancer Neg Hx     ALLERGIES:  is allergic to bee venom, feldene [piroxicam], naphazoline, cephalosporins, flexeril [cyclobenzaprine], and keflex [cephalexin].  MEDICATIONS:  Current Outpatient Medications  Medication Sig Dispense Refill   albuterol  (VENTOLIN  HFA) 108 (90 Base) MCG/ACT inhaler Inhale 2 puffs into the lungs every 4 (four) hours as needed for wheezing or shortness of breath. 6.7 g 0   ALPRAZolam  (XANAX ) 0.5 MG tablet Take 0.5 mg by mouth 2 (two) times daily as needed for anxiety.     D3 MAXIMUM STRENGTH 125 MCG (5000 UT) capsule Take 5,000 Units by mouth daily.     EPINEPHrine  0.3 mg/0.3 mL IJ SOAJ injection Inject 0.3 mg into the muscle as needed for anaphylaxis.     FLUoxetine  (PROZAC ) 20 MG capsule Take 20 mg by mouth daily.     fluticasone -salmeterol (ADVAIR) 250-50 MCG/ACT AEPB Inhale 1 puff into the lungs in the morning and at bedtime. 60 each 3   furosemide  (LASIX ) 40 MG tablet Take 1 tablet (40 mg total) by mouth daily as needed for edema. Or 2 lb  weight gain 30 tablet 1   latanoprost  (XALATAN ) 0.005 % ophthalmic solution Place 1 drop into both eyes at bedtime.     levothyroxine  (SYNTHROID ) 88 MCG tablet Take 88 mcg by mouth daily before breakfast.     oxyCODONE  (OXY IR/ROXICODONE ) 5 MG immediate release tablet Take 1-2 tablets (5-10 mg total) by mouth every 4 (four) hours as needed for moderate pain (pain score 4-6). 30 tablet 0   pantoprazole  (PROTONIX ) 40 MG tablet Take 1 tablet (40 mg total) by mouth 2 (two) times daily. 60 tablet 4   pramipexole  (MIRAPEX ) 1 MG tablet Take 2 mg by mouth 3 (three) times daily.     Spacer/Aero-Holding Chambers (COMPACT SPACE CHAMBER) DEVI Use with the albuterol  inhaler 1 each 0   SUMAtriptan  (IMITREX ) 100 MG tablet Take 100 mg by mouth every 2 (two) hours as needed for migraine. May repeat in 2 hours if headache persists or recurs.     Current Facility-Administered Medications  Medication Dose Route Frequency Provider Last Rate Last Admin   0.9 %  sodium chloride  infusion  500 mL Intravenous Once Kendra [name] Groom, MD        REVIEW OF SYSTEMS:   Constitutional: Denies fevers, chills or abnormal night sweats Eyes: Denies blurriness of vision, double vision or watery eyes Ears, nose, mouth, throat, and face: Denies mucositis or sore throat Respiratory: Denies cough, dyspnea or wheezes Cardiovascular: Denies palpitation, chest discomfort or lower extremity swelling Gastrointestinal:  Denies nausea, heartburn or change in bowel habits Skin: Denies abnormal skin rashes Lymphatics: Denies new lymphadenopathy or easy bruising Neurological:Denies numbness, tingling or new weaknesses Behavioral/Psych: Mood is stable, no new changes  All other systems were reviewed with the patient and are negative.  PHYSICAL EXAMINATION: ECOG PERFORMANCE STATUS: 1 - Symptomatic but completely ambulatory  There were no vitals filed for this visit.  There were no vitals filed for this visit.   GENERAL:alert, no distress  and comfortable SKIN: skin color, texture, turgor are normal, no rashes or significant lesions EYES: normal, conjunctiva are pink and non-injected, sclera clear OROPHARYNX:no exudate, no  erythema and lips, buccal mucosa, and tongue normal  NECK: supple, thyroid  normal size, non-tender, without nodularity LYMPH:  no palpable lymphadenopathy in the cervical, axillary or inguinal LUNGS: clear to auscultation and percussion with normal breathing effort HEART: regular rate & rhythm and no murmurs and no lower extremity edema ABDOMEN:abdomen soft, non-tender and normal bowel sounds Musculoskeletal:no cyanosis of digits and no clubbing  PSYCH: alert & oriented x 3 with fluent speech NEURO: no focal motor/sensory deficits  RADIOGRAPHIC STUDIES: I have personally reviewed the radiological images as listed and agreed with the findings in the report. No results found.

## 2024-11-12 ENCOUNTER — Ambulatory Visit: Admitting: Pulmonary Disease

## 2024-11-27 ENCOUNTER — Inpatient Hospital Stay

## 2024-11-27 ENCOUNTER — Inpatient Hospital Stay: Admitting: Hematology and Oncology
